# Patient Record
Sex: Female | Born: 1960 | Race: Black or African American | Hispanic: No | Marital: Single | Smoking: Never smoker
Health system: Southern US, Community
[De-identification: ages and names within clinical notes are randomized; demographics above are authoritative.]

## PROBLEM LIST (undated history)

## (undated) ENCOUNTER — Emergency Department (HOSPITAL_COMMUNITY): Admission: EM | Payer: Medicaid Other | Source: Home / Self Care

## (undated) DIAGNOSIS — C801 Malignant (primary) neoplasm, unspecified: Secondary | ICD-10-CM

## (undated) DIAGNOSIS — I10 Essential (primary) hypertension: Secondary | ICD-10-CM

## (undated) DIAGNOSIS — R112 Nausea with vomiting, unspecified: Secondary | ICD-10-CM

## (undated) DIAGNOSIS — Z9889 Other specified postprocedural states: Secondary | ICD-10-CM

## (undated) DIAGNOSIS — E669 Obesity, unspecified: Secondary | ICD-10-CM

## (undated) DIAGNOSIS — E079 Disorder of thyroid, unspecified: Secondary | ICD-10-CM

## (undated) DIAGNOSIS — R42 Dizziness and giddiness: Secondary | ICD-10-CM

## (undated) DIAGNOSIS — K219 Gastro-esophageal reflux disease without esophagitis: Secondary | ICD-10-CM

## (undated) HISTORY — DX: Essential (primary) hypertension: I10

## (undated) HISTORY — DX: Nausea with vomiting, unspecified: R11.2

## (undated) HISTORY — PX: ESOPHAGEAL DILATION: SHX303

## (undated) HISTORY — DX: Obesity, unspecified: E66.9

## (undated) HISTORY — DX: Other specified postprocedural states: Z98.890

## (undated) HISTORY — PX: ABDOMINAL HYSTERECTOMY: SHX81

---

## 1993-06-11 DIAGNOSIS — C801 Malignant (primary) neoplasm, unspecified: Secondary | ICD-10-CM

## 1993-06-11 HISTORY — DX: Malignant (primary) neoplasm, unspecified: C80.1

## 1993-06-11 HISTORY — PX: MASS EXCISION: SHX2000

## 1997-11-10 ENCOUNTER — Emergency Department (HOSPITAL_COMMUNITY): Admission: EM | Admit: 1997-11-10 | Discharge: 1997-11-10 | Payer: Self-pay | Admitting: Emergency Medicine

## 1998-03-31 ENCOUNTER — Emergency Department (HOSPITAL_COMMUNITY): Admission: EM | Admit: 1998-03-31 | Discharge: 1998-03-31 | Payer: Self-pay | Admitting: Emergency Medicine

## 1999-10-24 ENCOUNTER — Emergency Department (HOSPITAL_COMMUNITY): Admission: EM | Admit: 1999-10-24 | Discharge: 1999-10-24 | Payer: Self-pay | Admitting: Unknown Physician Specialty

## 1999-12-24 ENCOUNTER — Ambulatory Visit (HOSPITAL_COMMUNITY): Admission: EM | Admit: 1999-12-24 | Discharge: 1999-12-25 | Payer: Self-pay | Admitting: *Deleted

## 2000-04-11 ENCOUNTER — Encounter: Payer: Self-pay | Admitting: Emergency Medicine

## 2000-04-11 ENCOUNTER — Emergency Department (HOSPITAL_COMMUNITY): Admission: EM | Admit: 2000-04-11 | Discharge: 2000-04-11 | Payer: Self-pay

## 2000-04-23 ENCOUNTER — Emergency Department (HOSPITAL_COMMUNITY): Admission: EM | Admit: 2000-04-23 | Discharge: 2000-04-23 | Payer: Self-pay | Admitting: Emergency Medicine

## 2000-04-27 ENCOUNTER — Emergency Department (HOSPITAL_COMMUNITY): Admission: EM | Admit: 2000-04-27 | Discharge: 2000-04-27 | Payer: Self-pay | Admitting: Internal Medicine

## 2000-05-28 ENCOUNTER — Ambulatory Visit (HOSPITAL_COMMUNITY): Admission: EM | Admit: 2000-05-28 | Discharge: 2000-05-28 | Payer: Self-pay | Admitting: Emergency Medicine

## 2001-01-09 ENCOUNTER — Emergency Department (HOSPITAL_COMMUNITY): Admission: EM | Admit: 2001-01-09 | Discharge: 2001-01-09 | Payer: Self-pay | Admitting: Emergency Medicine

## 2001-05-29 ENCOUNTER — Emergency Department (HOSPITAL_COMMUNITY): Admission: EM | Admit: 2001-05-29 | Discharge: 2001-05-29 | Payer: Self-pay | Admitting: *Deleted

## 2001-09-02 ENCOUNTER — Emergency Department (HOSPITAL_COMMUNITY): Admission: EM | Admit: 2001-09-02 | Discharge: 2001-09-03 | Payer: Self-pay | Admitting: Emergency Medicine

## 2001-09-03 ENCOUNTER — Ambulatory Visit (HOSPITAL_COMMUNITY): Admission: RE | Admit: 2001-09-03 | Discharge: 2001-09-03 | Payer: Self-pay | Admitting: Emergency Medicine

## 2001-09-03 ENCOUNTER — Encounter: Payer: Self-pay | Admitting: Emergency Medicine

## 2001-11-22 ENCOUNTER — Emergency Department (HOSPITAL_COMMUNITY): Admission: EM | Admit: 2001-11-22 | Discharge: 2001-11-22 | Payer: Self-pay

## 2002-06-29 ENCOUNTER — Encounter: Admission: RE | Admit: 2002-06-29 | Discharge: 2002-08-13 | Payer: Self-pay | Admitting: Hematology & Oncology

## 2002-08-03 ENCOUNTER — Emergency Department (HOSPITAL_COMMUNITY): Admission: EM | Admit: 2002-08-03 | Discharge: 2002-08-03 | Payer: Self-pay | Admitting: Emergency Medicine

## 2002-08-04 ENCOUNTER — Encounter: Admission: RE | Admit: 2002-08-04 | Discharge: 2002-08-04 | Payer: Self-pay | Admitting: Gastroenterology

## 2002-08-04 ENCOUNTER — Encounter: Payer: Self-pay | Admitting: Gastroenterology

## 2002-08-06 ENCOUNTER — Ambulatory Visit (HOSPITAL_COMMUNITY): Admission: RE | Admit: 2002-08-06 | Discharge: 2002-08-06 | Payer: Self-pay | Admitting: Gastroenterology

## 2002-08-17 ENCOUNTER — Ambulatory Visit (HOSPITAL_COMMUNITY): Admission: RE | Admit: 2002-08-17 | Discharge: 2002-08-17 | Payer: Self-pay | Admitting: Gastroenterology

## 2002-09-08 ENCOUNTER — Ambulatory Visit (HOSPITAL_COMMUNITY): Admission: RE | Admit: 2002-09-08 | Discharge: 2002-09-08 | Payer: Self-pay | Admitting: Hematology & Oncology

## 2002-09-08 ENCOUNTER — Encounter: Payer: Self-pay | Admitting: Hematology & Oncology

## 2004-03-06 ENCOUNTER — Emergency Department (HOSPITAL_COMMUNITY): Admission: EM | Admit: 2004-03-06 | Discharge: 2004-03-06 | Payer: Self-pay | Admitting: Emergency Medicine

## 2004-06-01 ENCOUNTER — Ambulatory Visit: Payer: Self-pay | Admitting: Hematology & Oncology

## 2004-06-24 ENCOUNTER — Emergency Department (HOSPITAL_COMMUNITY): Admission: EM | Admit: 2004-06-24 | Discharge: 2004-06-24 | Payer: Self-pay | Admitting: Family Medicine

## 2004-08-02 ENCOUNTER — Ambulatory Visit: Payer: Self-pay | Admitting: Hematology & Oncology

## 2004-10-16 ENCOUNTER — Ambulatory Visit: Payer: Self-pay | Admitting: Hematology & Oncology

## 2004-10-23 ENCOUNTER — Inpatient Hospital Stay (HOSPITAL_COMMUNITY): Admission: AD | Admit: 2004-10-23 | Discharge: 2004-10-23 | Payer: Self-pay | Admitting: Family Medicine

## 2004-12-21 ENCOUNTER — Ambulatory Visit: Payer: Self-pay | Admitting: Cardiology

## 2004-12-26 ENCOUNTER — Ambulatory Visit: Payer: Self-pay | Admitting: Cardiology

## 2004-12-27 ENCOUNTER — Encounter: Admission: RE | Admit: 2004-12-27 | Discharge: 2004-12-27 | Payer: Self-pay | Admitting: Cardiology

## 2005-01-03 ENCOUNTER — Ambulatory Visit: Payer: Self-pay | Admitting: Cardiology

## 2005-01-03 ENCOUNTER — Inpatient Hospital Stay (HOSPITAL_BASED_OUTPATIENT_CLINIC_OR_DEPARTMENT_OTHER): Admission: RE | Admit: 2005-01-03 | Discharge: 2005-01-03 | Payer: Self-pay | Admitting: Internal Medicine

## 2005-01-09 ENCOUNTER — Ambulatory Visit: Payer: Self-pay | Admitting: Cardiology

## 2005-01-12 ENCOUNTER — Emergency Department (HOSPITAL_COMMUNITY): Admission: EM | Admit: 2005-01-12 | Discharge: 2005-01-12 | Payer: Self-pay | Admitting: Family Medicine

## 2005-01-17 ENCOUNTER — Ambulatory Visit: Payer: Self-pay | Admitting: Cardiology

## 2005-01-18 ENCOUNTER — Ambulatory Visit: Payer: Self-pay | Admitting: Hematology & Oncology

## 2005-02-19 ENCOUNTER — Emergency Department (HOSPITAL_COMMUNITY): Admission: EM | Admit: 2005-02-19 | Discharge: 2005-02-19 | Payer: Self-pay | Admitting: Family Medicine

## 2005-02-21 ENCOUNTER — Emergency Department (HOSPITAL_COMMUNITY): Admission: EM | Admit: 2005-02-21 | Discharge: 2005-02-21 | Payer: Self-pay | Admitting: Emergency Medicine

## 2005-03-12 ENCOUNTER — Ambulatory Visit: Payer: Self-pay | Admitting: Hematology & Oncology

## 2005-03-21 ENCOUNTER — Emergency Department (HOSPITAL_COMMUNITY): Admission: EM | Admit: 2005-03-21 | Discharge: 2005-03-21 | Payer: Self-pay | Admitting: Emergency Medicine

## 2005-03-23 ENCOUNTER — Ambulatory Visit (HOSPITAL_COMMUNITY): Admission: RE | Admit: 2005-03-23 | Discharge: 2005-03-23 | Payer: Self-pay | Admitting: Gastroenterology

## 2005-03-28 ENCOUNTER — Encounter: Admission: RE | Admit: 2005-03-28 | Discharge: 2005-03-28 | Payer: Self-pay | Admitting: Gastroenterology

## 2005-05-02 ENCOUNTER — Emergency Department (HOSPITAL_COMMUNITY): Admission: EM | Admit: 2005-05-02 | Discharge: 2005-05-02 | Payer: Self-pay | Admitting: Family Medicine

## 2005-05-23 ENCOUNTER — Emergency Department (HOSPITAL_COMMUNITY): Admission: EM | Admit: 2005-05-23 | Discharge: 2005-05-23 | Payer: Self-pay | Admitting: Emergency Medicine

## 2005-06-08 ENCOUNTER — Encounter: Admission: RE | Admit: 2005-06-08 | Discharge: 2005-06-08 | Payer: Self-pay | Admitting: Gastroenterology

## 2005-06-11 HISTORY — PX: MASTECTOMY: SHX3

## 2005-06-12 ENCOUNTER — Encounter: Admission: RE | Admit: 2005-06-12 | Discharge: 2005-06-12 | Payer: Self-pay | Admitting: *Deleted

## 2005-06-12 ENCOUNTER — Ambulatory Visit: Payer: Self-pay | Admitting: Hematology & Oncology

## 2005-07-19 ENCOUNTER — Ambulatory Visit (HOSPITAL_COMMUNITY): Admission: RE | Admit: 2005-07-19 | Discharge: 2005-07-19 | Payer: Self-pay | Admitting: Gastroenterology

## 2005-08-27 ENCOUNTER — Emergency Department (HOSPITAL_COMMUNITY): Admission: EM | Admit: 2005-08-27 | Discharge: 2005-08-27 | Payer: Self-pay | Admitting: Family Medicine

## 2005-11-01 ENCOUNTER — Emergency Department (HOSPITAL_COMMUNITY): Admission: EM | Admit: 2005-11-01 | Discharge: 2005-11-02 | Payer: Self-pay | Admitting: Emergency Medicine

## 2005-12-04 ENCOUNTER — Ambulatory Visit: Payer: Self-pay | Admitting: Hematology & Oncology

## 2005-12-10 LAB — CBC WITH DIFFERENTIAL/PLATELET
Basophils Absolute: 0 10*3/uL (ref 0.0–0.1)
Eosinophils Absolute: 0.2 10*3/uL (ref 0.0–0.5)
HGB: 13.3 g/dL (ref 11.6–15.9)
NEUT#: 3.7 10*3/uL (ref 1.5–6.5)
RDW: 13.9 % (ref 11.3–14.5)
WBC: 6 10*3/uL (ref 3.9–10.0)
lymph#: 1.8 10*3/uL (ref 0.9–3.3)

## 2005-12-20 ENCOUNTER — Encounter: Admission: RE | Admit: 2005-12-20 | Discharge: 2005-12-20 | Payer: Self-pay | Admitting: Hematology & Oncology

## 2006-01-28 ENCOUNTER — Emergency Department (HOSPITAL_COMMUNITY): Admission: EM | Admit: 2006-01-28 | Discharge: 2006-01-28 | Payer: Self-pay | Admitting: Family Medicine

## 2006-06-12 ENCOUNTER — Ambulatory Visit: Payer: Self-pay | Admitting: Hematology & Oncology

## 2006-06-17 LAB — COMPREHENSIVE METABOLIC PANEL
CO2: 21 mEq/L (ref 19–32)
Calcium: 9.3 mg/dL (ref 8.4–10.5)
Chloride: 108 mEq/L (ref 96–112)
Glucose, Bld: 83 mg/dL (ref 70–99)
Sodium: 140 mEq/L (ref 135–145)
Total Bilirubin: 0.3 mg/dL (ref 0.3–1.2)
Total Protein: 7.4 g/dL (ref 6.0–8.3)

## 2006-06-17 LAB — CBC WITH DIFFERENTIAL/PLATELET
Eosinophils Absolute: 0.1 10*3/uL (ref 0.0–0.5)
HCT: 38.7 % (ref 34.8–46.6)
LYMPH%: 28.7 % (ref 14.0–48.0)
MONO#: 0.3 10*3/uL (ref 0.1–0.9)
NEUT#: 3.9 10*3/uL (ref 1.5–6.5)
NEUT%: 63 % (ref 39.6–76.8)
Platelets: 294 10*3/uL (ref 145–400)
RBC: 4.67 10*6/uL (ref 3.70–5.32)
WBC: 6.1 10*3/uL (ref 3.9–10.0)
lymph#: 1.8 10*3/uL (ref 0.9–3.3)

## 2006-06-17 LAB — FERRITIN: Ferritin: 218 ng/mL (ref 10–291)

## 2006-12-10 ENCOUNTER — Ambulatory Visit: Payer: Self-pay | Admitting: Hematology & Oncology

## 2006-12-16 LAB — CBC WITH DIFFERENTIAL/PLATELET
BASO%: 0.6 % (ref 0.0–2.0)
EOS%: 3.8 % (ref 0.0–7.0)
HCT: 36.4 % (ref 34.8–46.6)
MCH: 28.2 pg (ref 26.0–34.0)
MCHC: 34.8 g/dL (ref 32.0–36.0)
MONO#: 0.3 10*3/uL (ref 0.1–0.9)
RBC: 4.49 10*6/uL (ref 3.70–5.32)
RDW: 13.4 % (ref 11.3–14.5)
WBC: 5.4 10*3/uL (ref 3.9–10.0)
lymph#: 1.8 10*3/uL (ref 0.9–3.3)

## 2006-12-16 LAB — COMPREHENSIVE METABOLIC PANEL
ALT: 14 U/L (ref 0–35)
AST: 18 U/L (ref 0–37)
CO2: 22 mEq/L (ref 19–32)
Calcium: 9 mg/dL (ref 8.4–10.5)
Chloride: 106 mEq/L (ref 96–112)
Potassium: 3.7 mEq/L (ref 3.5–5.3)
Sodium: 140 mEq/L (ref 135–145)
Total Protein: 7.1 g/dL (ref 6.0–8.3)

## 2006-12-16 LAB — FERRITIN: Ferritin: 223 ng/mL (ref 10–291)

## 2007-01-21 ENCOUNTER — Encounter: Admission: RE | Admit: 2007-01-21 | Discharge: 2007-01-21 | Payer: Self-pay | Admitting: *Deleted

## 2007-01-23 ENCOUNTER — Encounter: Admission: RE | Admit: 2007-01-23 | Discharge: 2007-01-23 | Payer: Self-pay | Admitting: *Deleted

## 2007-05-11 ENCOUNTER — Emergency Department (HOSPITAL_COMMUNITY): Admission: EM | Admit: 2007-05-11 | Discharge: 2007-05-11 | Payer: Self-pay | Admitting: Emergency Medicine

## 2008-02-26 ENCOUNTER — Encounter: Admission: RE | Admit: 2008-02-26 | Discharge: 2008-02-26 | Payer: Self-pay | Admitting: Family Medicine

## 2010-06-17 ENCOUNTER — Emergency Department (HOSPITAL_COMMUNITY)
Admission: EM | Admit: 2010-06-17 | Discharge: 2010-06-17 | Payer: Self-pay | Source: Home / Self Care | Admitting: Emergency Medicine

## 2010-07-03 ENCOUNTER — Encounter: Payer: Self-pay | Admitting: Family Medicine

## 2010-08-17 ENCOUNTER — Ambulatory Visit (HOSPITAL_COMMUNITY)
Admission: RE | Admit: 2010-08-17 | Discharge: 2010-08-17 | Disposition: A | Discharge status: Home / Self Care | Payer: Self-pay | Source: Ambulatory Visit | Attending: Family Medicine | Admitting: Family Medicine

## 2010-08-17 DIAGNOSIS — M79609 Pain in unspecified limb: Secondary | ICD-10-CM

## 2010-08-17 DIAGNOSIS — M7989 Other specified soft tissue disorders: Secondary | ICD-10-CM | POA: Insufficient documentation

## 2010-08-27 ENCOUNTER — Inpatient Hospital Stay (INDEPENDENT_AMBULATORY_CARE_PROVIDER_SITE_OTHER)
Admission: RE | Admit: 2010-08-27 | Discharge: 2010-08-27 | Disposition: A | Discharge status: Home / Self Care | Payer: Self-pay | Source: Ambulatory Visit | Attending: Family Medicine | Admitting: Family Medicine

## 2010-08-27 DIAGNOSIS — J019 Acute sinusitis, unspecified: Secondary | ICD-10-CM

## 2010-10-27 NOTE — Op Note (Signed)
   NAME:  [name], [name]                       ACCOUNT NO.:  [id number]   MEDICAL RECORD NO.:  [id number]                   PATIENT TYPE:  AMB   LOCATION:  ENDO                                 FACILITY:  [facility]   PHYSICIAN:  John C. Madilyn Fireman, M.D.                 DATE OF BIRTH:  [birth year]   DATE OF PROCEDURE:  08/06/2002  DATE OF DISCHARGE:                                 OPERATIVE REPORT   PROCEDURE:  Esophagogastroduodenoscopy with esophageal dilatation.   INDICATIONS FOR PROCEDURE:  A patient who has had a history of past  dysphagia status post empiric dilatation to 17 mm in the past in June 2003  whose had recurrent dysphagia. Barium swallow yesterday showed subtle  narrowing of the GE junction with a temporary hang up of 13 mm barium  tablet. The patient has had persistent sensation of solid food dysphagia for  the last week and desires repeat dilatation.   DESCRIPTION OF PROCEDURE:  The patient was placed in the left lateral  decubitus position then placed on the pulse monitor with continuous low flow  oxygen delivered by nasal cannula. She was sedated with 75 mcg IV fentanyl  and 10 mg IV Versed. The Olympus video endoscope was advanced under direct  vision into the oropharynx and esophagus. The esophagus was straight and of  normal caliber with the squamocolumnar line at 38 cm. Although there was  possibly some vague narrowing, I could not definitely discern any stricture  or ring. The scope passed fairly easily through the GE junction without  resistance. The stomach was entered and a small amount of liquid secretions  were suctioned from the fundus. Retroflexed view of the cardia was  unremarkable. The fundus, body, antrum and pylorus all appeared normal. The  duodenum was entered and both the bulb and second portion were well  inspected and appeared to be within normal limits. A Savary guidewire was  placed through the endoscope channel and the scope withdrawn. Savary  dilators of 16 and 17 mm were passed over the guidewire with mild resistance  and no blood seen on withdrawal. The  last dilator was removed together with  the wire and the patient returned to the recovery room in stable condition.  She tolerated the procedure well and there were no immediate complications.   IMPRESSION:  Possible subtle lower esophageal stricture dilated to 17 mm.   PLAN:  Advance diet and observe response to dilatation.                                                John C. Madilyn Fireman, M.D.    JCH/MEDQ  D:  08/06/2002  T:  08/06/2002  Job:  [id number]   cc:   Valeria Batman, M.D.

## 2010-10-27 NOTE — Op Note (Signed)
   NAME:  [name], [name]                       ACCOUNT NO.:  [id number]   MEDICAL RECORD NO.:  [id number]                   PATIENT TYPE:  AMB   LOCATION:  ENDO                                 FACILITY:  [facility]   PHYSICIAN:  John C. Madilyn Fireman, M.D.                 DATE OF BIRTH:  November 12, 1960   DATE OF PROCEDURE:  08/17/2002  DATE OF DISCHARGE:  08/17/2002                                 OPERATIVE REPORT   PROCEDURE PERFORMED:  Esophageal motility study.   ENDOSCOPIST:  Everardo All. Madilyn Fireman, M.D.   INDICATIONS FOR PROCEDURE:  Persistent dysphagia with no response to empiric  esophageal dilatation, no obvious stricture was seen but there was a mild  hang up of a 13 mm barium tablet at the gastroesophageal junction on prior  barium swallow.   RESULTS:  1. Upper esophageal sphincter:  Normal.  2. Lower esophageal sphincter:  Slightly high resting pressure at 57 mm with     normal relaxation at 76%.  3. Esophageal body:  Normal with 100% peristalsis.  Amplitude and duration     were normal.  Velocity slightly decreased on some swallows.   IMPRESSION:  Mild hypertensive lower esophageal sphincter with normal  relaxation, significance in regard to dysphagia unclear.   PLAN:  At this point will try a calcium channel blocker in the form of  Cardizem LA 180 mg a day and follow up in the office.                                               John C. Madilyn Fireman, M.D.    JCH/MEDQ  D:  08/20/2002  T:  08/21/2002  Job:  [id number]

## 2010-10-27 NOTE — Procedures (Signed)
[facility]  Patient:    [name], [name]                      MRN: [id number] Proc. Date: 05/28/00 Attending:  Verlin Grills, M.D. CC:         Almond Lint, M.D., [place], [place]   Procedure Report  PROCEDURE:  Esophagogastroduodenoscopy.  INDICATIONS:  [name] is a 50 year old female who presented to the [facility] Emergency Room with a meat bolus obstructing her esophagus.  Ms. Longie tells me she has undergone esophageal dilation at [facility] in the past.  INFORMED CONSENT:  I discussed with [name] the complications associated with esophagogastroduodenoscopy including intestinal bleeding and intestinal perforation.  [name] has signed the operative permit.  ENDOSCOPIST:  Verlin Grills, M.D.  PREMEDICATIONS:  Demerol 50 mg, Versed 10 mg.  ENDOSCOPE:  Olympus gastroscope.  DESCRIPTION OF PROCEDURE:  After obtaining informed consent, the patient was placed in the left lateral decubitus position.  I administered intravenous Demerol and intravenous Versed to achieve conscious sedation for the procedure.  The patients blood pressure, oxygen saturation, and cardiac rhythm were monitored throughout the procedure and documented in the medical record.  The Olympus gastroscope was passed through the posterior hypopharynx into the proximal esophagus without difficulty.  The hypopharynx, larynx and vocal cords appeared normal.  Esophagoscopy:  The proximal, mid and lower segments of the esophagus appeared normal.  Endoscopically, there was no food obstructing the esophagus.  There was no endoscopic evidence for the presence of Barretts esophagus, erosive esophagitis, esophageal mucosal scarring, or esophageal obstruction.  Gastroscopy:  Retroflex view of the gastric cardia and fundus was normal.  The gastric body was filled with liquid food matter.  The gastric antrum and pylorus appeared  normal.  Duodenoscopy:  The duodenal bulb and descending duodenum appeared normal.  ASSESSMENT:  Normal esophagogastroduodenoscopy.  No signs of esophageal obstruction.  No food obstructing the esophagus.  RECOMMENDATIONS:  I am referring [name] back to her primary care physician, who is Dr. Almond Lint in [place], [place], for repeat esophageal dilatation. DD:  05/28/00 TD:  05/30/00 Job: [id number]

## 2010-10-27 NOTE — Cardiovascular Report (Signed)
NAMERICCA, MELGAREJO NO.:  [id number]   MEDICAL RECORD NO.:  [id number]          PATIENT TYPE:  OIB   LOCATION:  6501                         FACILITY:  [facility]   PHYSICIAN:  Jonelle Sidle, M.D. LHCDATE OF BIRTH:  March 01, 1961   DATE OF PROCEDURE:  01/03/2005  DATE OF DISCHARGE:                              CARDIAC CATHETERIZATION   REQUESTING PHYSICIAN:  Jonelle Sidle, M.D.   INDICATIONS:  Ms. Bonneau is a 50 year old woman who recently referred for  an adenosine Cardiolite given recent complaints of palpitations.  This study  was interpreted by Dr. Myrtis Ser to show potential inferior wall ischemia with an  ejection fraction of 50%.  After reviewing the risks and benefits of  definitive diagnostic coronary angiography, the patient has agreed to  proceed for definitive evaluation of her coronary anatomy.   PROCEDURES PERFORMED:  1.  Left heart catheterization  2.  Selective coronary angiography.  3.  Left ventriculography.   ACCESS AND EQUIPMENT:  The area about the right femoral artery was  anesthetized with 1% lidocaine, and a  4-French sheath was placed in the  right femoral artery via modified Seldinger technique.  Standard preformed 4-  [place] JL-4 and JR-4 catheters were used for selective coronary angiography  and an angled pigtail catheter was used for left heart catheterization and  left ventriculography.  All exchanges were made over a wire.  The patient  tolerated procedure well without immediate complications.   HEMODYNAMICS:  Left ventricle 140/13 mmHg.  Aorta 141/84 mmHg.   ANGIOGRAPHIC FINDINGS:  1.  The left main coronary artery is medium in caliber and free of      significant flow-limiting coronary atherosclerosis.  2.  The left anterior descending is also medium in caliber and provides two      diagonal branches.  The vessel courses to the apex.  There are minor      luminal irregularities, but no flow-limiting coronary artery  disease is      noted.  3.  The circumflex coronary artery is medium in caliber.  There are two      diagonal obtuse marginal branches distally and a large trifurcating      proximal obtuse marginal branch.  No significant flow-limiting coronary      atherosclerosis is noted.  4.  The right coronary artery is relatively small in caliber and provides a      small posterior descending branch.  There are two proximal right      ventricular branches noted.  Minor luminal irregularities are seen      without any significant flow-limiting coronary atherosclerosis.  5.  Left ventriculography was performed in the RAO projection and reveals an      ejection fraction of approximately 65% without obvious wall motion      abnormality and no significant mitral regurgitation.   DIAGNOSES:  1.  No significant obstructive coronary disease noted in the major      epicardial vessels.  There are minor luminal irregularities as described      above.  2.  Left ventricular ejection fraction of approximately 65%  with a left      ventricular end-diastolic pressure of 13 mmHg and no significant mitral      regurgitation.   DISCUSSION:  I reviewed the results with the patient.  I suspect that the  Cardiolite was false positive and potentially affected by artifactual  changes.  At this point Ms. Fellner tells me that she is not having any  particular symptoms, and therefore would suggest a continued strategy of  basic risk factor modification. If she presents with any additional  complaints of palpitations, consideration could be given for an event  recorder.        SGM/MEDQ  D:  01/03/2005  T:  01/03/2005  Job:  [id number]   cc:   Donzetta Sprung  [address]  [place]  [place] [address]  Fax: [phone]

## 2010-10-27 NOTE — Op Note (Signed)
[name], [name]             ACCOUNT NO.:  [id number]   MEDICAL RECORD NO.:  [id number]          PATIENT TYPE:  AMB   LOCATION:  ENDO                         FACILITY:  [facility]   PHYSICIAN:  Petra Kuba, M.D.    DATE OF BIRTH:  [birth year]   DATE OF PROCEDURE:  03/23/2005  DATE OF DISCHARGE:                                 OPERATIVE REPORT   PROCEDURE:  Esophagogastroduodenoscopy.   INDICATIONS FOR PROCEDURE:  Odynophagia.  Consent was signed after risks,  benefits, methods, and options were thoroughly discussed by Dr. Madilyn Fireman  multiple times in the past.   MEDICATIONS USED:  Fentanyl 75 mcg, Versed 7.5 mg.   PROCEDURE:  The video endoscope was inserted by direct vision.  The  esophagus was normal.  The scope was advanced into the stomach, advanced  through a normal pylorus, into a normal duodenum bulb, and around the C-loop  to a normal second portion of the duodenum.  The scope was withdrawn back to  the bulb which, again, was normal.  The scope was withdrawn back to the  stomach, no evidence of gastritis, no other abnormalities were seen.  The  stomach was evaluated on straight and retroflex visualization with a good  look at the cardia, fundus, angularis, lesser and greater curve, on both  retroflex and straight visualization.  The air was suctioned, the scope was  slowly withdrawn.  Again, on very slow esophageal withdrawal, the esophagus  was completely normal.  A quick evaluation of the posterior pharynx was  normal, as well.  The scope was removed.  The patient tolerated the  procedure well.  There was no obvious immediate complications.   ENDOSCOPIC DIAGNOSIS:  1.  Minimal gastritis.  2.  Otherwise, normal EGD with a good look at the esophagus and a quick look      at the posterior pharynx.   PLAN:  Follow up with Dr. Madilyn Fireman p.r.n., call next week with the update, in  talking to  her, she does seem to be getting better and probably had a tiny  tear which has  irritated and is healing because she does say she is getting  better.  We will continue b.i.d. Nexium for now but when better, can go back  to once a day.           ______________________________  Petra Kuba, M.D.     MEM/MEDQ  D:  03/23/2005  T:  03/23/2005  Job:  [id number]   cc:   Donzetta Sprung  Fax: [phone]   John C. Madilyn Fireman, M.D.  Fax: [phone]

## 2010-10-27 NOTE — Procedures (Signed)
[facility] [place]  Patient:    TOVAH, SLAVICK Visit Number: [id number] MRN: [id number]          Service Type: EMS Location: [facility] Attending Physician:  Armanda Heritage Dictated by:   Everardo All Madilyn Fireman, M.D. Proc. Date: 11/22/01 Admit Date:  11/22/2001 Discharge Date: 11/22/2001                             Procedure Report  PROCEDURE PERFORMED:  Esophagogastroduodenoscopy with esophageal dilatation.  ENDOSCOPIST:  Everardo All. Madilyn Fireman, M.D.  INDICATIONS FOR PROCEDURE:  Sensation of foreign body impaction of the esophagus after eating a steak biscuit.  DESCRIPTION OF PROCEDURE:  The patient was placed in the left lateral decubitus position and placed on the pulse monitor with continuous low flow oxygen delivered by nasal cannula.  She was sedated with 100 mg of IV Demerol and 7.5 mg Versed.  The Olympus video endoscope was advanced under direct vision into the oropharynx and esophagus.  The esophagus was straight and of normal caliber.  The squamocolumnar line at 36 cm above a 2 cm sliding hiatal hernia.  I could not detect any stricture, ring or any foreign body in the esophagus and no visible esophagitis.  The stomach was entered and a small amount of liquid secretions were suctioned from the fundus. Retroflex view of the cardia was unremarkable.  The fundus, body, antrum and pylorus all appeared normal.  The duodenum was entered and both the bulb and second portion were well inspected and appeared to be within normal limits.  The Savary guide wire was placed through the endoscope channel.  Endoscope withdrawn.  A single 17 mm Savary dilator was passed over the guide wire with minimal resistance, no blood seen on withdrawal.  The guide wire was removed together with the wire and the patient returned to the recovery room in stable condition.  The patient tolerated the procedure well.  IMPRESSION: 1. No evidence of foreign body.  Suspect food bolus passed  shortly prior to    the procedure. 2. No obvious stricture, but cannot rule out a subtle ring.  Status post    empiric dilatation to 17 mm.  PLAN:  Advance diet to observe response to dilatation.  Continue Prilosec and follow up p.r.n. for any further dysphagia. Dictated by:   Everardo All Madilyn Fireman, M.D. Attending Physician:  Armanda Heritage DD:  11/22/01 TD:  11/24/01 Job: [id number]

## 2010-10-27 NOTE — Procedures (Signed)
[facility]  Patient:    MAKIA, BOSSI                    MRN: [id number] Proc. Date: 12/24/99 Adm. Date:  [id number] Disc. Date: [id number] Attending:  Ephriam Knuckles H                           Procedure Report  PROCEDURE:  Esophagogastroduodenoscopy.  INDICATIONS FOR PROCEDURE:  This pleasant 50 year old black female presented to the emergency room with esophageal food impaction at this time.  The patient is a resident of [place], [place] who was in [place] at this time.  She was eating a steak when she felt it got stuck in the esophageal region.  She had difficulty with swallowing and appeared to be associated high in the neck region.  She states she was able to take liquids down without emesing back up but she was afraid of attempting solid products.  The patient has a history of esophageal problems.  The patient has been diagnosed as having a hernia and has undergone dilatation approximately one year ago.  She denies any history of any peptic ulcer disease or any gallbladder or liver disease as noted.  She denies any hematemesis or melena or hematochezia.  She came into the emergency room where she was evaluated by ADP and I was contacted for a possible foreign body removal as noted.  OBJECTIVE FINDINGS:  She is a pleasant female who appears to be in no acute distress.  HEENT:  Anicteric.  NECK:  Supple.  LUNGS:  Clear.  HEART:  Regular rate and rhythm without heaves, murmurs or gallops.  ABDOMEN:  Soft, no tenderness, no hepatosplenomegaly.  EXTREMITIES:  Within normal limits.  PLAN:  Will proceed with endoscopic examination.  INFORMED CONSENT:  The patient was advised of procedure, indications and risks involved.  The patient has agreed to have the procedure performed at this time.  Consent form was obtained.  PREOPERATIVE PREPARATION:  Patient was brought to the endoscopy unit and underwent IV sedating mediation used.   Monitor was placed on the patient to monitor the patients vital signs and oxygen saturation.  Nasal oxygen at 2 L/minute was used.  After adequate sedation was performed, the procedure was begun.  DESCRIPTION OF PROCEDURE:  The instrument was advanced with the patient lying in the left lateral position via direct technique without difficulty.  The oropharyeal, epiglottis, vocal cords and piriform sinuses appeared to be grossly within normal limits.  The esophagus appeared to show some evidencee of debris present, but no evidenc of any food impaction that was noted.  There appeared to be no  acute inflammatory changes or ulcerated changes appreciated.  I did not appreciate a hiatal hernia at this time.  As the instrument was advanced further to the distal portion of the esophagus, the luminal region appeared to be closed at one point, but no evidence of any obstructive process that was present.  The instrument was able to advance through this region without difficulty and into the gastric area.  There were no varices or hiatal hernia appreciated.  The gastric area showed evidence of debris present in the gastric body that was noted. There was no evidence of acute inflammatory changes or ulcerated changes that were noticed.  The mucosal pattern could not be well visualized due to increased debris, but no gross abnormalities were noted.  The antrum appeared to be within limits  at this time.  The pylorus is normal and advancing into the pyloric canal, the duodenal bulb and second portion appeared to be within normal limits.  The instrument was retracted back for a retroflexed view of the cardia.  It did not show evidence of a hiatal hernia appreciated at this time.  There was however, noted, some focal inflammation noted around the cardia region that was present.  Photographs were taken of this region.  The instrument was retracted back with the Z-line appearing to be approximately 35 cm  distal esophagus as noted.  The instrument was retracted back into the esophagus, but there was no evidence of any postendoscopic trauma to the mucosa.  The instrument was subsequently removed per oram without difficulty and the patient tolerated the procedure well.  TREATMENT: 1. I would recommend PPI at this time. 2. If she has not taken her medications today, would give IV Pepcid while she    is resting and she subsequently can be discharged postprocedure. 3. Would recommend she follow up with a gastroenterologist in [place] and to    check to see if she had esophageal motility studies to evaluate the    function of the esophagus to see if there is any poor peristaltic activity    noted.  Depending upon these results would determine the course of therapy. D:  12/24/99 TD:  12/24/99 Job: [id number]

## 2010-11-06 ENCOUNTER — Emergency Department (HOSPITAL_COMMUNITY): Payer: No Typology Code available for payment source

## 2010-11-06 ENCOUNTER — Emergency Department (HOSPITAL_COMMUNITY)
Admission: EM | Admit: 2010-11-06 | Discharge: 2010-11-06 | Disposition: A | Discharge status: Home / Self Care | Payer: No Typology Code available for payment source | Attending: Emergency Medicine | Admitting: Emergency Medicine

## 2010-11-06 DIAGNOSIS — T1490XA Injury, unspecified, initial encounter: Secondary | ICD-10-CM | POA: Insufficient documentation

## 2010-11-06 DIAGNOSIS — Y92410 Unspecified street and highway as the place of occurrence of the external cause: Secondary | ICD-10-CM | POA: Insufficient documentation

## 2010-11-06 DIAGNOSIS — S40029A Contusion of unspecified upper arm, initial encounter: Secondary | ICD-10-CM | POA: Insufficient documentation

## 2010-11-06 DIAGNOSIS — M79609 Pain in unspecified limb: Secondary | ICD-10-CM | POA: Insufficient documentation

## 2011-05-17 ENCOUNTER — Other Ambulatory Visit: Payer: Self-pay | Admitting: Family Medicine

## 2011-05-17 DIAGNOSIS — Z9012 Acquired absence of left breast and nipple: Secondary | ICD-10-CM

## 2011-05-17 DIAGNOSIS — Z1231 Encounter for screening mammogram for malignant neoplasm of breast: Secondary | ICD-10-CM

## 2011-05-29 ENCOUNTER — Emergency Department (INDEPENDENT_AMBULATORY_CARE_PROVIDER_SITE_OTHER)
Admission: EM | Admit: 2011-05-29 | Discharge: 2011-05-29 | Disposition: A | Discharge status: Home / Self Care | Payer: Medicaid Other | Source: Home / Self Care | Attending: Family Medicine | Admitting: Family Medicine

## 2011-05-29 ENCOUNTER — Encounter: Payer: Self-pay | Admitting: *Deleted

## 2011-05-29 DIAGNOSIS — M26629 Arthralgia of temporomandibular joint, unspecified side: Secondary | ICD-10-CM

## 2011-05-29 DIAGNOSIS — H6590 Unspecified nonsuppurative otitis media, unspecified ear: Secondary | ICD-10-CM

## 2011-05-29 HISTORY — DX: Disorder of thyroid, unspecified: E07.9

## 2011-05-29 MED ORDER — IBUPROFEN 600 MG PO TABS: 600.0000 mg | ORAL_TABLET | Freq: Three times a day (TID) | ORAL | Status: AC | PRN

## 2011-05-29 MED ORDER — CYCLOBENZAPRINE HCL 10 MG PO TABS: 10.0000 mg | ORAL_TABLET | Freq: Two times a day (BID) | ORAL | Status: AC | PRN

## 2011-05-29 MED ORDER — FEXOFENADINE-PSEUDOEPHED ER 60-120 MG PO TB12: 1.0000 | ORAL_TABLET | Freq: Two times a day (BID) | ORAL | Status: AC

## 2011-05-29 NOTE — ED Notes (Signed)
Pt  Has  Symptoms    Of  r   Ear   Pain  Sensation of fullness  To  The  r  Ear      Symptoms  Began today  She  Reports       She  Had  Flu  Last  Week  That that  Is  Better

## 2011-05-31 NOTE — ED Provider Notes (Signed)
History     CSN: [id number] Arrival date & time: 05/29/2011  9:20 PM   First MD Initiated Contact with Patient 05/29/11 2015      Chief Complaint  Patient presents with  . Otalgia    (Consider location/radiation/quality/duration/timing/severity/associated sxs/prior treatment) HPI Comments: 50 y/o female with h/o hypothyroidism here c/o right ear discomfort described as "fullness sensation". No ear discharge. States she had flu like symptoms 1 week ago that are now resolved. Denies fever, cough difficulty breathing or chest pain. No sinus pressure or rhinorrhea. No h/o HTN. Denies headache,chest pain or leg swelling.    Past Medical History  Diagnosis Date  . Thyroid disease     Past Surgical History  Procedure Date  . Abdominal hysterectomy   . Mastectomy     Family History  Problem Relation Age of Onset  . Asthma Mother   . Diabetes Father     History  Substance Use Topics  . Smoking status: Not on file  . Smokeless tobacco: Not on file  . Alcohol Use:     OB History    Grav Para Term Preterm Abortions TAB SAB Ect Mult Living                  Review of Systems  Constitutional: Negative.   HENT: Positive for ear pain. Negative for congestion, sore throat, rhinorrhea, trouble swallowing, voice change, sinus pressure and tinnitus.   Respiratory: Negative for cough, choking, shortness of breath and wheezing.   Cardiovascular: Negative for chest pain, palpitations and leg swelling.  Skin: Negative for rash.  Neurological: Negative for dizziness and headaches.    Allergies  Aspirin  Home Medications   Current Outpatient Rx  Name Route Sig Dispense Refill  . LEVOTHYROXINE SODIUM 137 MCG PO TABS Oral Take 137 mcg by mouth daily.      . CYCLOBENZAPRINE HCL 10 MG PO TABS Oral Take 1 tablet (10 mg total) by mouth 2 (two) times daily as needed for muscle spasms. 20 tablet 0  . FEXOFENADINE-PSEUDOEPHED ER 60-120 MG PO TB12 Oral Take 1 tablet by mouth every 12  (twelve) hours. 30 tablet 0  . IBUPROFEN 600 MG PO TABS Oral Take 1 tablet (600 mg total) by mouth every 8 (eight) hours as needed for pain. 21 tablet 0    BP 161/93  Pulse 93  Temp(Src) 98.3 F (36.8 C) (Oral)  Resp 20  SpO2 96%  Physical Exam  Nursing note and vitals reviewed. Constitutional: She is oriented to person, place, and time. She appears well-developed and well-nourished. No distress.  HENT:  Head: Normocephalic and atraumatic.  Right Ear: External ear normal.  Left Ear: External ear normal.  Nose: Nose normal.  Mouth/Throat: Oropharynx is clear and moist. No oropharyngeal exudate.       Impress clear fluid behind both TM otherwise with normal light reflex and no swelling or redness.  Right ear canal normal. No vesicles erythema or swelling. There is tenderness to palpation of the right TMJ worse with range of motion. No obvious swelling. No tenderness over right mastoid process. 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  Eyes: Conjunctivae are normal. Pupils are equal, round, and reactive to light.  Neck: Neck supple.  Cardiovascular: Normal rate, regular rhythm and normal heart sounds.   Pulmonary/Chest: Effort normal and breath sounds normal. No respiratory distress. She has no wheezes. She has no rales. She exhibits no tenderness.  Musculoskeletal:       Tender right  TMJ as above.  Lymphadenopathy:    She has no cervical adenopathy.  Neurological: She is alert and oriented to person, place, and time.    ED Course  Procedures (including critical care time)  Labs Reviewed - No data to display No results found.   1. Otitis media, serous   2. TMJ arthralgia       MDM  No signs of infection. Treated symptomatically.        Sharin Grave, MD 05/31/11 [phone]

## 2011-06-21 ENCOUNTER — Ambulatory Visit
Admission: RE | Admit: 2011-06-21 | Discharge: 2011-06-21 | Disposition: A | Discharge status: Home / Self Care | Payer: No Typology Code available for payment source | Source: Ambulatory Visit | Attending: Family Medicine | Admitting: Family Medicine

## 2011-06-21 DIAGNOSIS — Z9012 Acquired absence of left breast and nipple: Secondary | ICD-10-CM

## 2011-06-21 DIAGNOSIS — Z1231 Encounter for screening mammogram for malignant neoplasm of breast: Secondary | ICD-10-CM

## 2011-07-16 ENCOUNTER — Ambulatory Visit: Payer: Medicaid Other | Attending: Family Medicine | Admitting: Physical Therapy

## 2011-07-16 DIAGNOSIS — I890 Lymphedema, not elsewhere classified: Secondary | ICD-10-CM | POA: Insufficient documentation

## 2011-07-16 DIAGNOSIS — IMO0001 Reserved for inherently not codable concepts without codable children: Secondary | ICD-10-CM | POA: Insufficient documentation

## 2011-08-13 ENCOUNTER — Encounter: Payer: No Typology Code available for payment source | Admitting: Physical Therapy

## 2011-08-17 ENCOUNTER — Encounter: Payer: No Typology Code available for payment source | Admitting: Physical Therapy

## 2011-08-20 ENCOUNTER — Encounter: Payer: No Typology Code available for payment source | Admitting: Physical Therapy

## 2011-08-22 ENCOUNTER — Encounter: Payer: No Typology Code available for payment source | Admitting: Physical Therapy

## 2011-08-24 ENCOUNTER — Encounter: Payer: No Typology Code available for payment source | Admitting: Physical Therapy

## 2011-08-27 ENCOUNTER — Encounter: Payer: No Typology Code available for payment source | Admitting: Physical Therapy

## 2011-08-29 ENCOUNTER — Encounter: Payer: No Typology Code available for payment source | Admitting: Physical Therapy

## 2011-08-31 ENCOUNTER — Encounter: Payer: No Typology Code available for payment source | Admitting: Physical Therapy

## 2011-09-03 ENCOUNTER — Encounter: Payer: No Typology Code available for payment source | Admitting: Physical Therapy

## 2011-09-05 ENCOUNTER — Encounter: Payer: No Typology Code available for payment source | Admitting: Physical Therapy

## 2012-12-06 ENCOUNTER — Encounter (HOSPITAL_COMMUNITY): Payer: Self-pay | Admitting: *Deleted

## 2012-12-06 ENCOUNTER — Emergency Department (HOSPITAL_COMMUNITY)
Admission: EM | Admit: 2012-12-06 | Discharge: 2012-12-06 | Disposition: A | Discharge status: Home / Self Care | Payer: Self-pay | Attending: Emergency Medicine | Admitting: Emergency Medicine

## 2012-12-06 ENCOUNTER — Emergency Department (HOSPITAL_COMMUNITY): Payer: Self-pay

## 2012-12-06 DIAGNOSIS — J329 Chronic sinusitis, unspecified: Secondary | ICD-10-CM | POA: Insufficient documentation

## 2012-12-06 DIAGNOSIS — R42 Dizziness and giddiness: Secondary | ICD-10-CM | POA: Insufficient documentation

## 2012-12-06 DIAGNOSIS — IMO0002 Reserved for concepts with insufficient information to code with codable children: Secondary | ICD-10-CM | POA: Insufficient documentation

## 2012-12-06 DIAGNOSIS — E079 Disorder of thyroid, unspecified: Secondary | ICD-10-CM | POA: Insufficient documentation

## 2012-12-06 DIAGNOSIS — J3489 Other specified disorders of nose and nasal sinuses: Secondary | ICD-10-CM | POA: Insufficient documentation

## 2012-12-06 DIAGNOSIS — H9209 Otalgia, unspecified ear: Secondary | ICD-10-CM | POA: Insufficient documentation

## 2012-12-06 DIAGNOSIS — Z79899 Other long term (current) drug therapy: Secondary | ICD-10-CM | POA: Insufficient documentation

## 2012-12-06 DIAGNOSIS — R112 Nausea with vomiting, unspecified: Secondary | ICD-10-CM | POA: Insufficient documentation

## 2012-12-06 DIAGNOSIS — R51 Headache: Secondary | ICD-10-CM | POA: Insufficient documentation

## 2012-12-06 MED ORDER — FLUTICASONE PROPIONATE 50 MCG/ACT NA SUSP: 2.0000 | Freq: Every day | NASAL | Status: DC

## 2012-12-06 MED ORDER — ACETAMINOPHEN 325 MG PO TABS
650.0000 mg | ORAL_TABLET | Freq: Once | ORAL | Status: AC
  Administered 2012-12-06: 650 mg via ORAL
  Filled 2012-12-06: qty 2

## 2012-12-06 MED ORDER — FEXOFENADINE HCL 60 MG PO TABS: 60.0000 mg | ORAL_TABLET | Freq: Two times a day (BID) | ORAL | Status: DC

## 2012-12-06 MED ORDER — DEXAMETHASONE SODIUM PHOSPHATE 10 MG/ML IJ SOLN
10.0000 mg | Freq: Once | INTRAMUSCULAR | Status: AC
  Administered 2012-12-06: 10 mg via INTRAMUSCULAR
  Filled 2012-12-06: qty 1

## 2012-12-06 NOTE — ED Notes (Signed)
Pt states pain in head is mostly around her eyes and nose area.

## 2012-12-06 NOTE — ED Notes (Addendum)
Pt c/o headache, eye pain, nausea and ear pain that started this morning. Pt states she has not been around anyone who has been sick. Pt rates headache 5/10. Pt states she did not take any medications for pain prior to arrival.

## 2012-12-06 NOTE — ED Notes (Signed)
Pt states Eye pain, ear pain, HA, nose pain, N/V/D, starting today at 5:00 pm

## 2012-12-06 NOTE — ED Provider Notes (Signed)
History    CSN: [id number] Arrival date & time 12/06/12  1906  First MD Initiated Contact with Patient 12/06/12 2042     Chief Complaint  Patient presents with  . Nausea   (Consider location/radiation/quality/duration/timing/severity/associated sxs/prior Treatment) HPI Comments: Patient presents with headache. She states she woke up this morning with a bifrontal-type headache. She states it's been intermittent throughout the day. She describes as a pressure feeling in her frontal scalp area. She also associates at times radiates to her face and around her nose. She also feels like her ears are clogged and sore. She denies any pain in her eyes or vision changes. She states the pain is above her eyes in the bifrontal area but she denies any actual pain around orbits were to her eyes. She felt a little lightheaded she stands up. She states the headache gets worse if she bends over to pick something up. She's had a history of similar headache many years ago in the past which was related to a sinus infection. She denies any known sinus congestion or drainage. She denies any cough or chest congestion. She denies he fevers or chills. She denies any neck pain or stiffness. She's had 2 episodes of vomiting associated with the headache.  Past Medical History  Diagnosis Date  . Thyroid disease    Past Surgical History  Procedure Laterality Date  . Abdominal hysterectomy    . Mastectomy     Family History  Problem Relation Age of Onset  . Asthma Mother   . Diabetes Father    History  Substance Use Topics  . Smoking status: Not on file  . Smokeless tobacco: Not on file  . Alcohol Use:    OB History   Grav Para Term Preterm Abortions TAB SAB Ect Mult Living                 Review of Systems  Constitutional: Negative for fever, chills, diaphoresis and fatigue.  HENT: Positive for ear pain and sinus pressure. Negative for congestion, sore throat, facial swelling, rhinorrhea, sneezing,  mouth sores, dental problem and voice change.   Eyes: Negative.   Respiratory: Negative for cough, chest tightness and shortness of breath.   Cardiovascular: Negative for chest pain and leg swelling.  Gastrointestinal: Positive for nausea and vomiting. Negative for abdominal pain, diarrhea and blood in stool.  Genitourinary: Negative for frequency, hematuria, flank pain and difficulty urinating.  Musculoskeletal: Negative for back pain and arthralgias.  Skin: Negative for rash.  Neurological: Positive for light-headedness and headaches. Negative for dizziness, speech difficulty, weakness and numbness.    Allergies  Aspirin  Home Medications   Current Outpatient Rx  Name  Route  Sig  Dispense  Refill  . levothyroxine (SYNTHROID, LEVOTHROID) 137 MCG tablet   Oral   Take 137 mcg by mouth daily.           Marland Kitchen lisinopril-hydrochlorothiazide (PRINZIDE,ZESTORETIC) 10-12.5 MG per tablet   Oral   Take 1 tablet by mouth daily.         . fexofenadine (ALLEGRA) 60 MG tablet   Oral   Take 1 tablet (60 mg total) by mouth 2 (two) times daily.   30 tablet   0   . fluticasone (FLONASE) 50 MCG/ACT nasal spray   Nasal   Place 2 sprays into the nose daily.   16 g   0    BP 118/69  Pulse 84  Temp(Src) 98.1 F (36.7 C) (Oral)  Resp 18  SpO2 94% Physical Exam  Constitutional: She is oriented to person, place, and time. She appears well-developed and well-nourished.  HENT:  Head: Normocephalic and atraumatic.  Right Ear: External ear normal.  Left Ear: External ear normal.  Mouth/Throat: Oropharynx is clear and moist.  There is some cobblestoning to the posterior pharynx. Patient is tenderness to percussion of the maxillary and frontal sinus areas. She has no pain on palpation over the eyes.  Eyes: Conjunctivae and EOM are normal. Pupils are equal, round, and reactive to light.  Neck: Normal range of motion. Neck supple.  No meningeal signs  Cardiovascular: Normal rate, regular  rhythm and normal heart sounds.   Pulmonary/Chest: Effort normal and breath sounds normal. No respiratory distress. She has no wheezes. She has no rales. She exhibits no tenderness.  Abdominal: Soft. Bowel sounds are normal. There is no tenderness. There is no rebound and no guarding.  Musculoskeletal: Normal range of motion. She exhibits no edema.  Lymphadenopathy:    She has no cervical adenopathy.  Neurological: She is alert and oriented to person, place, and time. She has normal strength. No cranial nerve deficit or sensory deficit. GCS eye subscore is 4. GCS verbal subscore is 5. GCS motor subscore is 6.  Finger to nose intact  Skin: Skin is warm and dry. No rash noted.  Psychiatric: She has a normal mood and affect.    ED Course  Procedures (including critical care time)  Ct Head Wo Contrast  12/06/2012   *RADIOLOGY REPORT*  Clinical Data: Headache and vomiting.  Ear pain.  CT HEAD WITHOUT CONTRAST  Technique:  Contiguous axial images were obtained from the base of the skull through the vertex without contrast.  Comparison: None.  Findings: Bone windows demonstrate clear paranasal sinuses and mastoid air cells.  Soft tissue windows demonstrate no  mass lesion, hemorrhage, hydrocephalus, acute infarct, intra-axial, or extra-axial fluid collection.  IMPRESSION: Normal head CT.   Original Report Authenticated By: Jeronimo Greaves, M.D.     1. Sinusitis     MDM  Patient with complaints of headache. She was given a dose of Tylenol here and said her headache was much better. I did do a head CT given her past remote history of cancer but there is no evidence of mass. She has no symptoms suggestive of subarachnoid hemorrhage or meningitis. Her symptoms sound suggestive for sinusitis but given it's only been gone on for one day I do not feel that antibiotics are indicated. She was given a dose of Decadron as well as Allegra and Flonase to use. She is advised to followup with her primary care  physician if her headaches are not improving with this or return here if she has any worsening headache or other worsening symptoms.  Rolan Bucco, MD 12/06/12 [phone]

## 2013-02-12 ENCOUNTER — Other Ambulatory Visit: Payer: Self-pay

## 2013-02-12 DIAGNOSIS — Z1231 Encounter for screening mammogram for malignant neoplasm of breast: Secondary | ICD-10-CM

## 2013-03-05 ENCOUNTER — Ambulatory Visit
Admission: RE | Admit: 2013-03-05 | Discharge: 2013-03-05 | Disposition: A | Discharge status: Home / Self Care | Payer: Medicaid Other | Source: Ambulatory Visit

## 2013-03-05 DIAGNOSIS — Z1231 Encounter for screening mammogram for malignant neoplasm of breast: Secondary | ICD-10-CM

## 2013-05-08 ENCOUNTER — Emergency Department (HOSPITAL_COMMUNITY)
Admission: EM | Admit: 2013-05-08 | Discharge: 2013-05-08 | Disposition: A | Discharge status: Home / Self Care | Payer: Self-pay | Attending: Emergency Medicine | Admitting: Emergency Medicine

## 2013-05-08 ENCOUNTER — Encounter (HOSPITAL_COMMUNITY): Payer: Self-pay | Admitting: Emergency Medicine

## 2013-05-08 DIAGNOSIS — Z853 Personal history of malignant neoplasm of breast: Secondary | ICD-10-CM | POA: Insufficient documentation

## 2013-05-08 DIAGNOSIS — Z79899 Other long term (current) drug therapy: Secondary | ICD-10-CM | POA: Insufficient documentation

## 2013-05-08 DIAGNOSIS — E079 Disorder of thyroid, unspecified: Secondary | ICD-10-CM | POA: Insufficient documentation

## 2013-05-08 DIAGNOSIS — T18128A Food in esophagus causing other injury, initial encounter: Secondary | ICD-10-CM

## 2013-05-08 DIAGNOSIS — T18108A Unspecified foreign body in esophagus causing other injury, initial encounter: Secondary | ICD-10-CM | POA: Insufficient documentation

## 2013-05-08 DIAGNOSIS — Y929 Unspecified place or not applicable: Secondary | ICD-10-CM | POA: Insufficient documentation

## 2013-05-08 DIAGNOSIS — Y9389 Activity, other specified: Secondary | ICD-10-CM | POA: Insufficient documentation

## 2013-05-08 DIAGNOSIS — IMO0002 Reserved for concepts with insufficient information to code with codable children: Secondary | ICD-10-CM | POA: Insufficient documentation

## 2013-05-08 HISTORY — DX: Malignant (primary) neoplasm, unspecified: C80.1

## 2013-05-08 MED ORDER — GLUCAGON HCL (RDNA) 1 MG IJ SOLR
1.0000 mg | Freq: Once | INTRAMUSCULAR | Status: AC
  Administered 2013-05-08: 1 mg via INTRAVENOUS
  Filled 2013-05-08: qty 1

## 2013-05-08 NOTE — ED Notes (Signed)
Pt reports she ate a piece of cake that had pecans in it, and it feels like she had a pecan stuck in her throat. Denies sob. Speaking in full sentences, raspy voice. Nad, skin warm and dry, resp e/u.

## 2013-05-08 NOTE — ED Provider Notes (Signed)
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 She states that she 80 P. 60 today with a continent and felt that she had a pea stuck. She has had difficulty swallowing her saliva but this has improved. She was unable to swallow water at home. No difficulty breathing. Patient has a raspy voice but states is his baseline.  ROS: See HPI Constitutional: no fever  Eyes: no drainage  ENT: no runny nose   Cardiovascular:  no chest pain  Resp: no SOB  GI: no vomiting GU: no dysuria Integumentary: no rash  Allergy: no hives  Musculoskeletal: no leg swelling  Neurological: no slurred speech ROS otherwise negative  PAST MEDICAL HISTORY/PAST SURGICAL HISTORY:  Past Medical History  Diagnosis Date  . Thyroid disease   . Cancer     breast cancer    MEDICATIONS:  Prior to Admission medications   Medication Sig Start Date End Date Taking? Authorizing Provider  Fexofenadine HCl (ALLEGRA PO) Take 1 tablet by mouth daily as needed (for allergies).   Yes Historical Provider, MD  levothyroxine (SYNTHROID, LEVOTHROID) 137 MCG tablet Take 137 mcg by mouth daily.     Yes Historical Provider, MD  lisinopril-hydrochlorothiazide (PRINZIDE,ZESTORETIC) 10-12.5 MG per tablet Take 1 tablet by mouth daily.   Yes Historical Provider, MD    ALLERGIES:  Allergies  Allergen Reactions  . Aspirin Swelling    SOCIAL HISTORY:  History  Substance Use Topics  . Smoking status: Never Smoker   . Smokeless tobacco: Not on file  . Alcohol Use: No    FAMILY HISTORY: Family History  Problem Relation Age of Onset  . Asthma Mother   . Diabetes Father     EXAM: BP 135/84  Temp(Src) 98.8 F (37.1 C) (Oral)   Resp 16  Ht 5\' 4"  (1.626 m)  Wt 209 lb (94.802 kg)  BMI 35.86 kg/m2  SpO2 100% CONSTITUTIONAL: Alert and oriented and responds appropriately to questions. Well-appearing; well-nourished HEAD: Normocephalic EYES: Conjunctivae clear, PERRL ENT: normal nose; no rhinorrhea; moist mucous membranes; pharynx without lesions noted; no tonsillar hypertrophy or exudate; raspy voice which patient reports is baseline NECK: Supple, no meningismus, no LAD  CARD: RRR; S1 and S2 appreciated; no murmurs, no clicks, no rubs, no gallops RESP: Normal chest excursion without splinting or tachypnea; breath sounds clear and equal bilaterally; no wheezes, no rhonchi, no rales,  ABD/GI: Normal bowel sounds; non-distended; soft, non-tender, no rebound, no guarding BACK:  The back appears normal and is non-tender to palpation, there is no CVA tenderness EXT: Normal ROM in all joints; non-tender to palpation; no edema; normal capillary refill; no cyanosis    SKIN: Normal color for age and race; warm NEURO: Moves all extremities equally PSYCH: The patient's mood and manner are appropriate. Grooming and personal hygiene are appropriate.  MEDICAL DECISION MAKING: Patient here with food bolus. Will give glucagon and reassess. No airway involvement. She has a prior history of esophageal stricture requiring endoscopy and removal of a prior food bolus many years ago.  ED PROGRESS: Patient reports some improvement of her symptoms after glucagon. Will by mouth challenge.   Patient has been able to tolerate liquids and reports feeling much better. We'll discharge home.  She reports she is followed with Dr. Jenne Pane with gastroenterology. Given return precautions. Patient verbalized understanding and is comfortable with plan.  Layla Maw Jayanna Kroeger, DO 05/08/13 1322

## 2013-07-08 ENCOUNTER — Telehealth: Payer: Self-pay | Admitting: *Deleted

## 2013-07-08 NOTE — Telephone Encounter (Signed)
Pt called to ask to speak with Dr. Marin Olp.  States she was treated by him for her breast cancer many  Years ago and she has swelling in the arm she had the masectomy in.  States she has no insurance and just lost her job.  Dr. Marin Olp states he will call her.

## 2013-07-13 ENCOUNTER — Other Ambulatory Visit: Payer: Self-pay | Admitting: Hematology & Oncology

## 2013-07-13 DIAGNOSIS — C50911 Malignant neoplasm of unspecified site of right female breast: Secondary | ICD-10-CM

## 2013-07-14 ENCOUNTER — Ambulatory Visit (HOSPITAL_BASED_OUTPATIENT_CLINIC_OR_DEPARTMENT_OTHER): Payer: Medicaid Other | Admitting: Hematology & Oncology

## 2013-07-14 ENCOUNTER — Other Ambulatory Visit (HOSPITAL_BASED_OUTPATIENT_CLINIC_OR_DEPARTMENT_OTHER): Payer: Medicaid Other | Admitting: Lab

## 2013-07-14 ENCOUNTER — Encounter: Payer: Self-pay | Admitting: Hematology & Oncology

## 2013-07-14 VITALS — BP 113/71 | HR 90 | Temp 98.6°F | Resp 14 | Ht 64.0 in | Wt 207.0 lb

## 2013-07-14 DIAGNOSIS — Z8501 Personal history of malignant neoplasm of esophagus: Secondary | ICD-10-CM

## 2013-07-14 DIAGNOSIS — C50912 Malignant neoplasm of unspecified site of left female breast: Secondary | ICD-10-CM

## 2013-07-14 DIAGNOSIS — R490 Dysphonia: Secondary | ICD-10-CM

## 2013-07-14 DIAGNOSIS — C50911 Malignant neoplasm of unspecified site of right female breast: Secondary | ICD-10-CM

## 2013-07-14 DIAGNOSIS — I890 Lymphedema, not elsewhere classified: Secondary | ICD-10-CM

## 2013-07-14 DIAGNOSIS — C50919 Malignant neoplasm of unspecified site of unspecified female breast: Secondary | ICD-10-CM

## 2013-07-14 DIAGNOSIS — Z9010 Acquired absence of unspecified breast and nipple: Secondary | ICD-10-CM

## 2013-07-14 LAB — CBC WITH DIFFERENTIAL (CANCER CENTER ONLY)
BASO#: 0 10*3/uL (ref 0.0–0.2)
BASO%: 0.1 % (ref 0.0–2.0)
EOS%: 1.9 % (ref 0.0–7.0)
Eosinophils Absolute: 0.1 10*3/uL (ref 0.0–0.5)
HEMATOCRIT: 35.9 % (ref 34.8–46.6)
HEMOGLOBIN: 11.8 g/dL (ref 11.6–15.9)
LYMPH#: 2.2 10*3/uL (ref 0.9–3.3)
LYMPH%: 29.2 % (ref 14.0–48.0)
MCH: 27.7 pg (ref 26.0–34.0)
MCHC: 32.9 g/dL (ref 32.0–36.0)
MCV: 84 fL (ref 81–101)
MONO#: 0.6 10*3/uL (ref 0.1–0.9)
MONO%: 7.9 % (ref 0.0–13.0)
NEUT%: 60.9 % (ref 39.6–80.0)
NEUTROS ABS: 4.6 10*3/uL (ref 1.5–6.5)
Platelets: 267 10*3/uL (ref 145–400)
RBC: 4.26 10*6/uL (ref 3.70–5.32)
RDW: 13.4 % (ref 11.1–15.7)
WBC: 7.5 10*3/uL (ref 3.9–10.0)

## 2013-07-14 LAB — CMP (CANCER CENTER ONLY)
ALBUMIN: 3.8 g/dL (ref 3.3–5.5)
ALK PHOS: 72 U/L (ref 26–84)
ALT(SGPT): 12 U/L (ref 10–47)
AST: 18 U/L (ref 11–38)
BUN, Bld: 11 mg/dL (ref 7–22)
CALCIUM: 9.1 mg/dL (ref 8.0–10.3)
CHLORIDE: 103 meq/L (ref 98–108)
CO2: 30 mEq/L (ref 18–33)
Creat: 0.6 mg/dl (ref 0.6–1.2)
Glucose, Bld: 86 mg/dL (ref 73–118)
POTASSIUM: 4.2 meq/L (ref 3.3–4.7)
SODIUM: 142 meq/L (ref 128–145)
TOTAL PROTEIN: 8.6 g/dL — AB (ref 6.4–8.1)
Total Bilirubin: 0.5 mg/dl (ref 0.20–1.60)

## 2013-07-15 LAB — CANCER ANTIGEN 27.29: CA 27.29: 15 U/mL (ref 0–39)

## 2013-08-07 ENCOUNTER — Telehealth: Payer: Self-pay | Admitting: Hematology & Oncology

## 2013-08-07 NOTE — Telephone Encounter (Signed)
Pt called said we were suppose to call for follow up appointment from her 2-3 appointment. Pt is aware to call me wens on my direct number if she hasn't heard from me. MD and RN aware

## 2013-08-12 ENCOUNTER — Telehealth: Payer: Self-pay | Admitting: Hematology & Oncology

## 2013-08-12 NOTE — Telephone Encounter (Signed)
Pt called no one has still called her for follow-up. Pt left message on RN line

## 2013-08-17 ENCOUNTER — Telehealth: Payer: Self-pay | Admitting: Hematology & Oncology

## 2013-08-17 NOTE — Telephone Encounter (Signed)
Pt called again, no one has called her back. Per RN they will call her today. Pt also wants synthroid prescription, RN aware

## 2013-08-17 NOTE — Telephone Encounter (Signed)
Pt aware of 3-20 MRI and 3-24 MD appointments

## 2013-08-25 ENCOUNTER — Other Ambulatory Visit: Payer: Self-pay | Admitting: *Deleted

## 2013-08-25 MED ORDER — LEVOTHYROXINE SODIUM 150 MCG PO TABS: 150.0000 ug | ORAL_TABLET | Freq: Every day | ORAL | Status: DC

## 2013-08-28 ENCOUNTER — Other Ambulatory Visit: Payer: Self-pay | Admitting: Hematology & Oncology

## 2013-08-28 ENCOUNTER — Ambulatory Visit (HOSPITAL_COMMUNITY)
Admission: RE | Admit: 2013-08-28 | Discharge: 2013-08-28 | Disposition: A | Discharge status: Home / Self Care | Payer: Self-pay | Source: Ambulatory Visit | Attending: Hematology & Oncology | Admitting: Hematology & Oncology

## 2013-08-28 DIAGNOSIS — E079 Disorder of thyroid, unspecified: Secondary | ICD-10-CM | POA: Insufficient documentation

## 2013-08-28 DIAGNOSIS — M7989 Other specified soft tissue disorders: Secondary | ICD-10-CM | POA: Insufficient documentation

## 2013-08-28 DIAGNOSIS — Z9010 Acquired absence of unspecified breast and nipple: Secondary | ICD-10-CM | POA: Insufficient documentation

## 2013-08-28 DIAGNOSIS — M79609 Pain in unspecified limb: Secondary | ICD-10-CM | POA: Insufficient documentation

## 2013-08-28 DIAGNOSIS — C50912 Malignant neoplasm of unspecified site of left female breast: Secondary | ICD-10-CM

## 2013-08-28 DIAGNOSIS — C50919 Malignant neoplasm of unspecified site of unspecified female breast: Secondary | ICD-10-CM | POA: Insufficient documentation

## 2013-08-28 MED ORDER — GADOBENATE DIMEGLUMINE 529 MG/ML IV SOLN
20.0000 mL | Freq: Once | INTRAVENOUS | Status: AC | PRN
  Administered 2013-08-28: 20 mL via INTRAVENOUS

## 2013-08-29 ENCOUNTER — Emergency Department (HOSPITAL_COMMUNITY): Payer: Self-pay

## 2013-08-29 ENCOUNTER — Encounter (HOSPITAL_COMMUNITY): Payer: Self-pay | Admitting: Emergency Medicine

## 2013-08-29 ENCOUNTER — Emergency Department (HOSPITAL_COMMUNITY)
Admission: EM | Admit: 2013-08-29 | Discharge: 2013-08-29 | Disposition: A | Discharge status: Home / Self Care | Payer: Self-pay | Attending: Emergency Medicine | Admitting: Emergency Medicine

## 2013-08-29 DIAGNOSIS — Z87828 Personal history of other (healed) physical injury and trauma: Secondary | ICD-10-CM | POA: Insufficient documentation

## 2013-08-29 DIAGNOSIS — Z79899 Other long term (current) drug therapy: Secondary | ICD-10-CM | POA: Insufficient documentation

## 2013-08-29 DIAGNOSIS — R0989 Other specified symptoms and signs involving the circulatory and respiratory systems: Secondary | ICD-10-CM

## 2013-08-29 DIAGNOSIS — R6889 Other general symptoms and signs: Secondary | ICD-10-CM | POA: Insufficient documentation

## 2013-08-29 DIAGNOSIS — Z8719 Personal history of other diseases of the digestive system: Secondary | ICD-10-CM | POA: Insufficient documentation

## 2013-08-29 DIAGNOSIS — Z853 Personal history of malignant neoplasm of breast: Secondary | ICD-10-CM | POA: Insufficient documentation

## 2013-08-29 DIAGNOSIS — E039 Hypothyroidism, unspecified: Secondary | ICD-10-CM | POA: Insufficient documentation

## 2013-08-29 LAB — BASIC METABOLIC PANEL
BUN: 12 mg/dL (ref 6–23)
CO2: 25 mEq/L (ref 19–32)
Calcium: 9.5 mg/dL (ref 8.4–10.5)
Chloride: 102 mEq/L (ref 96–112)
Creatinine, Ser: 0.82 mg/dL (ref 0.50–1.10)
GFR, EST NON AFRICAN AMERICAN: 81 mL/min — AB (ref >90)
Glucose, Bld: 94 mg/dL (ref 70–99)
POTASSIUM: 4 meq/L (ref 3.7–5.3)
SODIUM: 141 meq/L (ref 137–147)

## 2013-08-29 LAB — CBC WITH DIFFERENTIAL/PLATELET
BASOS ABS: 0 10*3/uL (ref 0.0–0.1)
BASOS PCT: 0 % (ref 0–1)
EOS ABS: 0.2 10*3/uL (ref 0.0–0.7)
Eosinophils Relative: 3 % (ref 0–5)
HCT: 37.8 % (ref 36.0–46.0)
Hemoglobin: 12.8 g/dL (ref 12.0–15.0)
Lymphocytes Relative: 36 % (ref 12–46)
Lymphs Abs: 2.3 10*3/uL (ref 0.7–4.0)
MCH: 27.9 pg (ref 26.0–34.0)
MCHC: 33.9 g/dL (ref 30.0–36.0)
MCV: 82.5 fL (ref 78.0–100.0)
Monocytes Absolute: 0.3 10*3/uL (ref 0.1–1.0)
Monocytes Relative: 4 % (ref 3–12)
NEUTROS PCT: 56 % (ref 43–77)
Neutro Abs: 3.6 10*3/uL (ref 1.7–7.7)
PLATELETS: 238 10*3/uL (ref 150–400)
RBC: 4.58 MIL/uL (ref 3.87–5.11)
RDW: 13.9 % (ref 11.5–15.5)
WBC: 6.3 10*3/uL (ref 4.0–10.5)

## 2013-08-29 MED ORDER — GLUCAGON HCL (RDNA) 1 MG IJ SOLR: 1.0000 mg | Freq: Once | INTRAMUSCULAR | Status: DC

## 2013-08-29 MED ORDER — IOHEXOL 300 MG/ML  SOLN
75.0000 mL | Freq: Once | INTRAMUSCULAR | Status: AC | PRN
  Administered 2013-08-29: 75 mL via INTRAVENOUS

## 2013-08-29 NOTE — ED Notes (Signed)
Pt returned from xray

## 2013-08-29 NOTE — Discharge Instructions (Signed)
Swallowed Foreign Body, Adult You have swallowed an object (foreign body). Once the foreign body has passed through the food tube (esophagus), which leads from the mouth to the stomach, it will usually continue through the body without problems. This is because the point where the esophagus enters into the stomach is the narrowest place through which the foreign body must pass. Sometimes the foreign body gets stuck. The most common type of foreign body obstruction in adults is food impaction. Many times, bones from fish or meat products may become lodged in the esophagus or injure the throat on the way down. When there is an object that obstructs the esophagus, the most obvious symptoms are pain and the inability to swallow normally. In some cases, foreign bodies that can be life threatening are swallowed. Examples of these are certain medications and illicit drugs. Often in these instances, patients are afraid of telling what they swallowed. However, it is extremely important to tell the emergency caregiver what was swallowed because life-saving treatment may be needed.  X-ray exams may be taken to find the location of the foreign body. However, some objects do not show up well or may be too small to be seen on an X-ray image. If the foreign body is too large or too sharp, it may be too dangerous to allow it to pass on its own. You may need to see a caregiver who specializes in the digestive system (gastroenterologist). In a few cases, a specialist may need to remove the object using a method called "endoscopy". This involves passing a thin, soft, flexible tube into the food pipe to locate and remove the object. Follow up with your primary doctor or the referral you were given by the emergency caregiver. HOME CARE INSTRUCTIONS   If your caregiver says it is safe for you to eat, then only have liquids and soft foods until your symptoms improve.  Once you are eating normally:  Cut food into small  pieces.  Remove small bones from food.  Remove large seeds and pits from fruit.  Chew your food well.  Do not talk, laugh, or engage in physical activity while eating or swallowing. SEEK MEDICAL CARE IF:  You develop worsening shortness of breath, uncontrollable coughing, chest pains or high fever, greater than 102 F (38.9 C).  You are unable to eat or drink or you feel that food is getting stuck in your throat.  You have choking symptoms or cannot stop drooling.  You develop abdominal pain, vomiting (especially of blood), or rectal bleeding. MAKE SURE YOU:   Understand these instructions.  Will watch your condition.  Will get help right away if you are not doing well or get worse. Document Released: 11/15/2009 Document Revised: 08/20/2011 Document Reviewed: 11/15/2009 [facility] Patient Information 2014 [place].    Follow- up with your Oncology doctor regarding your MRI results, you may need an ultrasound and biopsy Return if symptoms worsen

## 2013-08-29 NOTE — ED Notes (Signed)
Pt tolerating oral fluids 

## 2013-08-29 NOTE — ED Provider Notes (Signed)
CSN: [id number]     Arrival date & time 08/29/13  1151 History   First MD Initiated Contact with Patient 08/29/13 1223     Chief Complaint  Patient presents with  . Foreign Body     (Consider location/radiation/quality/duration/timing/severity/associated sxs/prior Treatment) Patient is a 53 y.o. female presenting with foreign body. The history is provided by the patient. No language interpreter was used.  Foreign Body Location:  Swallowed Suspected object:  Nut Pain severity:  No pain Timing:  Constant Progression:  Unchanged Ineffective treatments:  Eating Associated symptoms: no abdominal pain, no cough, no nausea and no vomiting    52y.o AA F with hx of breast cancer, and hypothyroidism presents to ER with suspected cashew stuck in esophagus.  Pt describes eating cashews last night and had a feeling that one got stuck in throat.  She ate a sandwich afterwards to try and clear it, but the feeling persisted.  She has not had anything to eat today.  Denies SOB, cough, changes to voice.  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  She denies N/V.  She has hx of having food stuck in esophagus twice in last year and states a Doctor told her that she has a narrow spot in esophagus.  On exam, pt is in no pain or distress.  Past Medical History  Diagnosis Date  . Thyroid disease   . Cancer     breast cancer   Past Surgical History  Procedure Laterality Date  . Abdominal hysterectomy    . Mastectomy     Family History  Problem Relation Age of Onset  . Asthma Mother   . Diabetes Father    History  Substance Use Topics  . Smoking status: Never Smoker   . Smokeless tobacco: Never Used     Comment: never used product  . Alcohol Use: No   OB History   Grav Para Term Preterm Abortions TAB SAB Ect Mult Living                 Review of Systems  Constitutional: Negative for fever, chills and  diaphoresis.  Respiratory: Negative for cough, chest tightness, shortness of breath and wheezing.   Gastrointestinal: Negative for nausea, vomiting, abdominal pain and diarrhea.      Allergies  Aspirin  Home Medications   Current Outpatient Rx  Name  Route  Sig  Dispense  Refill  . Fexofenadine HCl (ALLEGRA PO)   Oral   Take 1 tablet by mouth as needed (for allergies).          Marland Kitchen levothyroxine (SYNTHROID) 150 MCG tablet   Oral   Take 1 tablet (150 mcg total) by mouth daily before breakfast.   30 tablet   3   . lisinopril-hydrochlorothiazide (PRINZIDE,ZESTORETIC) 10-12.5 MG per tablet   Oral   Take 1 tablet by mouth daily.          BP 151/73  Pulse 70  Temp(Src) 98.4 F (36.9 C) (Oral)  Resp 16  SpO2 100% Physical Exam  Constitutional: She appears well-developed and well-nourished. No distress.  HENT:  Head: Normocephalic.  Eyes: Conjunctivae are normal. Pupils are equal, round, and reactive to light.  Neck: Normal range of motion. No tracheal deviation present.  Cardiovascular: Normal rate, regular rhythm and normal heart sounds.   Pulmonary/Chest: Effort normal and breath sounds normal. No respiratory distress. She  has no wheezes.  Abdominal: Soft. Bowel sounds are normal. There is no tenderness.    ED Course  Procedures (including critical care time) Labs Review Labs Reviewed - No data to display Imaging Review Mr Chest W Wo Contrast  08/28/2013   CLINICAL DATA:  Breast cancer with history of left mastectomy. Left arm pain and swelling. Evaluate for possible chest wall recurrence or brachial plexus involvement.  EXAM: MRI CHEST WITHOUT AND WITH CONTRAST  TECHNIQUE: Multiplanar multisequence MR imaging of the chest was performed both before and after administration of intravenous contrast.  CONTRAST:  23mL MULTIHANCE GADOBENATE DIMEGLUMINE 529 MG/ML IV SOLN  COMPARISON:  None.  FINDINGS: There are surgical changes from a left mastectomy. No MR findings for  chest wall tumor. The vector Alice muscle appears normal. No axillary lymphadenopathy or mass. The break heel plexus structures appear normal. The neurovascular bundle is surrounded by fat. There are scattered lymph nodes noted in the left supraclavicular region along the course of the brachial plexus. The largest node measures 12.5 x 5.5 mm. There is a large complex left paratracheal mass measuring 3.7 x 3.2 cm. This is most likely a thyroid lesion. Areas of increased T1 signal intensity suggest hemorrhage. Recommend ultrasound correlation and consideration for biopsy.  There is a 12.5 x 9.5 mm right paratracheal node. No other mediastinal lymph nodes are identified.  The chest wall musculature is normal. No obvious lung lesions. The bony structures are unremarkable. No obvious bone lesions.  IMPRESSION: 3.7 x 3.2 cm complex left thyroid mass. Recommend ultrasound correlation and consideration for biopsy.  Small scattered lower neck nodes, left supraclavicular nodes and left subpectoral nodes. No mass in the brachial plexus region. PET-CT may be helpful for further evaluation.   Electronically Signed   By: Kalman Jewels M.D.   On: 08/28/2013 09:45     EKG Interpretation None      MDM   Final diagnoses:  Foreign body sensation in throat   History of difficulty swallowing and choking episode with a cashew. History of esophageal stricture. Pt continues to feel foreign body sensation in her throat. Negative CT or neck. No difficulty breathing, edema, wheezing or difficulty swallowing. Fluid challenge given in ER and tolerated well. Also, pt had history of eating a sandwich without any problems after episode with the nut. Return precautions given. Follow-up with PCP.      Elisha Headland, NP 09/03/13 2210

## 2013-08-29 NOTE — ED Notes (Signed)
Pt presents to department for evaluation of foreign body to throat. States she thinks she has peanut lodged in throat. Respirations unlabored. States issues with narrow esophagus. Denies pain. No signs of distress noted at the time.

## 2013-08-29 NOTE — ED Notes (Signed)
Pt states shes had surgery on her vocal cords due to a mass which makes her esophagus narrow. States she was eating cashews yesterday and feels like something got stuck in her throat. Pt is breathing fine, lung sounds clear, sats 100% RA. No stridor noted.

## 2013-09-01 ENCOUNTER — Encounter: Payer: Self-pay | Admitting: Hematology & Oncology

## 2013-09-01 ENCOUNTER — Ambulatory Visit (HOSPITAL_BASED_OUTPATIENT_CLINIC_OR_DEPARTMENT_OTHER): Payer: Medicaid Other | Admitting: Hematology & Oncology

## 2013-09-01 VITALS — BP 132/70 | HR 83 | Temp 98.2°F | Resp 14 | Ht 64.0 in | Wt 207.0 lb

## 2013-09-01 DIAGNOSIS — Z853 Personal history of malignant neoplasm of breast: Secondary | ICD-10-CM

## 2013-09-01 DIAGNOSIS — I890 Lymphedema, not elsewhere classified: Secondary | ICD-10-CM

## 2013-09-01 DIAGNOSIS — E079 Disorder of thyroid, unspecified: Secondary | ICD-10-CM

## 2013-09-01 NOTE — Progress Notes (Signed)
Hematology and Oncology Follow Up Visit  [name] [id number] 03/14/1961 53 y.o. 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  Cooley is in for a long awaited followup. We last saw her back in 2008. At that point in time, she been free of her breast cancer recurrence by 10 years.  She wanted to come back to see [place] at she's having some issues. She has some hoarseness. There is more swelling in her left arm. She had a mastectomy with lymph node dissection on 52 years ago. She's having more pain in the left arm. It is hard for her  to really do much as because of some swelling and pain. I think we will have to get an MRI of the brachial plexus to make sure there is no local recurrence in the axilla.  There is no chest pain. There is no cough. She's had no bleeding. There's been no change in bowel or bladder habits. Her last mammogram was in September of 2014 and looked good with no problems in the right breast.  Medications: Current outpatient prescriptions:lisinopril-hydrochlorothiazide (PRINZIDE,ZESTORETIC) 10-12.5 MG per tablet, Take 1 tablet by mouth daily., Disp: , Rfl: ;  fexofenadine (ALLEGRA) 180 MG tablet, Take 180 mg by mouth daily as needed for allergies or rhinitis., Disp: , Rfl: ;  levothyroxine (SYNTHROID) 150 MCG tablet, Take 1 tablet (150 mcg total) by mouth daily before breakfast., Disp: 30 tablet, Rfl: 3  Allergies:  Allergies  Allergen Reactions  . Aspirin Swelling    Past Medical History, Surgical history, Social history, and Family History were reviewed and updated.  Review of Systems: As above   Physical Exam:  height is 5\' 4"  (1.626 m) and weight is 207 lb (93.895 kg). Her oral temperature is 98.6 F (37 C). Her blood pressure is 113/71 and her pulse is 90. Her respiration is 14.   Obese African [place] female. Her head neck exam  shows some slight fullness in the left lower neck. There may be a thyroid nodule. There is no adenopathy in the neck. There is some slight fullness in the left supraclavicular fossa. Lungs are clear. Cardiac exam regular rate and rhythm with no murmurs rubs or bruits. Breast exam shows right breast no masses edema or erythema. There is no right axillary adenopathy. The left chest wall shows a well healed mastectomy. There are some radiation changes. There is no actual left axillary adenopathy. Abdomen is soft. Is good bowel sounds. There is no fluid wave. There is no palpable liver or spleen tip. Back shows no tenderness over the spine ribs or hips. Extremities showed 2+ lymphedema in the left arm. She has some decreased range of motion of the left arm and shoulder. Right arm and legs are unremarkable. Skin exam no rashes. Neurological exam no focal neurological deficits.  Lab Results  Component Value Date   WBC 6.3 08/29/2013   HGB 12.8 08/29/2013   HCT 37.8 08/29/2013   MCV 82.5 08/29/2013   PLT 238 08/29/2013     Chemistry      Component Value Date/Time   NA 141 08/29/2013 1321   NA 142 07/14/2013 1025   K 4.0 08/29/2013 1321   K 4.2 07/14/2013 1025   CL 102 08/29/2013 1321   CL 103 07/14/2013 1025   CO2 25 08/29/2013 1321   CO2 30 07/14/2013 1025   BUN  12 08/29/2013 1321   BUN 11 07/14/2013 1025   CREATININE 0.82 08/29/2013 1321   CREATININE 0.6 07/14/2013 1025      Component Value Date/Time   CALCIUM 9.5 08/29/2013 1321   CALCIUM 9.1 07/14/2013 1025   ALKPHOS 72 07/14/2013 1025   ALKPHOS 62 12/16/2006 0922   AST 18 07/14/2013 1025   AST 18 12/16/2006 0922   ALT 12 07/14/2013 1025   ALT 14 12/16/2006 0922   BILITOT 0.50 07/14/2013 1025   BILITOT 0.4 12/16/2006 6283         Impression and Plan: [name] is 53 year old African American female. She has a remote history of a locally recurrent breast cancer. This was a mucinous subtype. She underwent resection and radiation. She was on tamoxifen.  Again I am  not sure which going on with the neck. The worse this could be coming from the thyroid. We'll have to do in a CT or MRI.  We will evaluate the lymphedema also. We'll see if there is any issues in the left axilla.  It was good to see her again. Again have not seen her for 6 years.  I spent a good hour with her today.  We will get her back in about a month or so.   Volanda Napoleon, MD 3/24/201512:59 PM

## 2013-09-01 NOTE — Progress Notes (Signed)
Hematology and Oncology Follow Up Visit  [name] [id number] 1961/02/20 53 y.o. 09/01/2013   Principle Diagnosis:   History of locally recurrent mucinous adenocarcinoma of the left breast-16 years disease free  Current Therapy:    Observation     Interim History:  Ms.  Cooley is back for followup. We last saw her about a month or so ago. We did do an MRI. She is having some hoarseness. She does have a left thyroid lobe mass. It measures about 3 x 4 x 3 cm. We will need to get an or sound and biopsy of this.  CT scan did not show any suspicious lymphadenopathy. His MRI of the chest did not show any pulmonary nodules. There is no suspicious lymphadenopathy in the chest. Brachial plexus of the left arm was a normal. She has lymphedema of the left arm. This does cause her problems. It swells up when she tries to use it for any amount of time. She is on some diuretic which helps on occasion.  She has occasional pain down the right leg. This has been going on for a couple months. We will need to get an MRI of the lumbar spine. Mucinous breast cancers can 10 to metastasized late.  She's had no fever. She's had no bleeding.  Medications: Current outpatient prescriptions:fexofenadine (ALLEGRA) 180 MG tablet, Take 180 mg by mouth daily as needed for allergies or rhinitis., Disp: , Rfl: ;  levothyroxine (SYNTHROID) 150 MCG tablet, Take 1 tablet (150 mcg total) by mouth daily before breakfast., Disp: 30 tablet, Rfl: 3;  lisinopril-hydrochlorothiazide (PRINZIDE,ZESTORETIC) 10-12.5 MG per tablet, Take 1 tablet by mouth daily., Disp: , Rfl:   Allergies:  Allergies  Allergen Reactions  . Aspirin Swelling    Past Medical History, Surgical history, Social history, and Family History were reviewed and updated.  Review of Systems: As above  Physical Exam:  height is 5\' 4"  (1.626 m) and weight is 207 lb (93.895 kg). Her oral temperature is 98.2 F (36.8 C). Her blood pressure is 132/70 and  her pulse is 83. Her respiration is 14.   Somewhat obese Afro-American female. She does have some fullness in the left lower neck. Slight tenderness is noted. There is some slight fullness in the left supraclavicular region. No oral lesions are noted. Ocular exam is unremarkable. Lungs are clear. Cardiac exam regular in rhythm. Abdomen is soft. She has good bowel sounds. There is no fluid wave. There is no palpable liver or spleen tip. Back exam shows some tenderness in the lumbosacral spine. She has some slight spasms in this region. Extremities shows 2+ lymphedema of the left arm. She has decreased range of motion of the left arm and shoulder. There is no erythema. She has slightly decreased strength of the left arm. Right arm or legs are unremarkable. Neurological exam is nonfocal. Lab Results  Component Value Date   WBC 6.3 08/29/2013   HGB 12.8 08/29/2013   HCT 37.8 08/29/2013   MCV 82.5 08/29/2013   PLT 238 08/29/2013     Chemistry      Component Value Date/Time   NA 141 08/29/2013 1321   NA 142 07/14/2013 1025   K 4.0 08/29/2013 1321   K 4.2 07/14/2013 1025   CL 102 08/29/2013 1321   CL 103 07/14/2013 1025   CO2 25 08/29/2013 1321   CO2 30 07/14/2013 1025   BUN 12 08/29/2013 1321   BUN 11 07/14/2013 1025   CREATININE 0.82 08/29/2013 1321  CREATININE 0.6 07/14/2013 1025      Component Value Date/Time   CALCIUM 9.5 08/29/2013 1321   CALCIUM 9.1 07/14/2013 1025   ALKPHOS 72 07/14/2013 1025   ALKPHOS 62 12/16/2006 0922   AST 18 07/14/2013 1025   AST 18 12/16/2006 0922   ALT 12 07/14/2013 1025   ALT 14 12/16/2006 0922   BILITOT 0.50 07/14/2013 1025   BILITOT 0.4 12/16/2006 0539         Impression and Plan: [name] is a 53 year old African American female. She is a remote history of locally recurrent mucinous adenocarcinoma of the breast. She underwent radiation for this. She was on tamoxifen. The recurrent was 16 years ago.  I think we may have a new problem with this thyroid mass. We will have to get  this evaluated and biopsied. It certainly would not be totally shocking if this were found to be recurrent mucinous adenocarcinoma. If so, then she will clearly need thyroidectomy and possible radiation with aromatase inhibitor therapy.  We will get the scans set up. I will plan to see her back in another 4-6 weeks.   I spent a good 45 minutes with her today. I went over her scans that she had previously. I went over lab work. I explained why we had to get the thyroid biopsied and why we had to look at the lower back. She understands this and is very thankful for our approach to taking care of her.   Volanda Napoleon, MD 3/24/201512:51 PM

## 2013-09-02 ENCOUNTER — Telehealth: Payer: Self-pay | Admitting: Hematology & Oncology

## 2013-09-02 NOTE — Telephone Encounter (Signed)
Pt aware of 3-31 [place], MRI and 5-20 MD appointments

## 2013-09-03 ENCOUNTER — Other Ambulatory Visit: Payer: Self-pay | Admitting: *Deleted

## 2013-09-03 MED ORDER — AZITHROMYCIN 250 MG PO TABS: ORAL_TABLET | ORAL | Status: DC

## 2013-09-08 ENCOUNTER — Other Ambulatory Visit: Payer: Self-pay | Admitting: Hematology & Oncology

## 2013-09-08 ENCOUNTER — Ambulatory Visit (HOSPITAL_COMMUNITY)
Admission: RE | Admit: 2013-09-08 | Discharge: 2013-09-08 | Disposition: A | Discharge status: Home / Self Care | Payer: Self-pay | Source: Ambulatory Visit | Attending: Hematology & Oncology | Admitting: Hematology & Oncology

## 2013-09-08 DIAGNOSIS — M5137 Other intervertebral disc degeneration, lumbosacral region: Secondary | ICD-10-CM | POA: Insufficient documentation

## 2013-09-08 DIAGNOSIS — E079 Disorder of thyroid, unspecified: Secondary | ICD-10-CM

## 2013-09-08 DIAGNOSIS — Z853 Personal history of malignant neoplasm of breast: Secondary | ICD-10-CM | POA: Insufficient documentation

## 2013-09-08 DIAGNOSIS — M79609 Pain in unspecified limb: Secondary | ICD-10-CM | POA: Insufficient documentation

## 2013-09-08 DIAGNOSIS — M51379 Other intervertebral disc degeneration, lumbosacral region without mention of lumbar back pain or lower extremity pain: Secondary | ICD-10-CM | POA: Insufficient documentation

## 2013-09-08 DIAGNOSIS — M5126 Other intervertebral disc displacement, lumbar region: Secondary | ICD-10-CM | POA: Insufficient documentation

## 2013-09-08 MED ORDER — GADOBENATE DIMEGLUMINE 529 MG/ML IV SOLN
20.0000 mL | Freq: Once | INTRAVENOUS | Status: AC | PRN
  Administered 2013-09-08: 20 mL via INTRAVENOUS

## 2013-09-08 NOTE — ED Provider Notes (Signed)
Medical screening examination/treatment/procedure(s) were performed by non-physician practitioner and as supervising physician I was immediately available for consultation/collaboration.   EKG Interpretation None        Alfonzo Feller, DO 09/08/13 1037

## 2013-09-09 ENCOUNTER — Telehealth: Payer: Self-pay | Admitting: Hematology & Oncology

## 2013-09-09 ENCOUNTER — Telehealth: Payer: Self-pay

## 2013-09-09 NOTE — Telephone Encounter (Signed)
Pt scheduled 7-27 MD appointment. Computer was down on 3-30. No office note or orders for follow up. She has been coming q 4 months. I left message with RN to check if 7-27 ok

## 2013-09-09 NOTE — Telephone Encounter (Signed)
Pt has no insurance and was sent a [employer] application via mail today, to complete and return and/or mail to [facility].

## 2013-09-09 NOTE — Telephone Encounter (Signed)
Called to schedule biopsy, Lucky Rathke gave me [phone] # to call GI IR does those. I left message with pt info to call and schedule appointment. Per voice mail greeting I faxed copy of order [phone]) with cover sheet order in Epic please call Pt to schedule

## 2013-09-09 NOTE — Telephone Encounter (Addendum)
Message copied by Johny Drilling on Wed Sep 09, 2013 11:28 AM ------      Message from: Volanda Napoleon      Created: Tue Sep 08, 2013  8:35 PM       Call - may have a tumor in the left lobe of thyroid.  Need to have a bx done  I will set this up.  Pete ------  Attempted to contact pt with [place] & MRI results. Voicemail not set up. Will attempt to contact pt again. dph

## 2013-09-10 ENCOUNTER — Telehealth: Payer: Self-pay | Admitting: Hematology & Oncology

## 2013-09-10 ENCOUNTER — Telehealth: Payer: Self-pay | Admitting: *Deleted

## 2013-09-10 NOTE — Telephone Encounter (Signed)
Pt has biopsy appointment 4-7 I left message on IR voice mail was checking to see if pt was aware of appointment. No note in system

## 2013-09-10 NOTE — Telephone Encounter (Signed)
Message copied by Rico Ala on Thu Sep 10, 2013  2:21 PM ------      Message from: Volanda Napoleon      Created: Tue Sep 08, 2013  8:35 PM       Call - may have a tumor in the left lobe of thyroid.  Need to have a bx done  I will set this up.  Pete ------

## 2013-09-10 NOTE — Telephone Encounter (Signed)
Dr Marin Olp asked me to let patient know that MRI showed no cancer in the back. She does have have a bulging disc - does not need surgery.  Patient may have tumor in thyroid, will need to have biopsy done

## 2013-09-15 ENCOUNTER — Other Ambulatory Visit (HOSPITAL_COMMUNITY)
Admission: RE | Admit: 2013-09-15 | Discharge: 2013-09-15 | Disposition: A | Discharge status: Home / Self Care | Payer: No Typology Code available for payment source | Source: Ambulatory Visit | Attending: Interventional Radiology | Admitting: Interventional Radiology

## 2013-09-15 ENCOUNTER — Ambulatory Visit
Admission: RE | Admit: 2013-09-15 | Discharge: 2013-09-15 | Disposition: A | Discharge status: Home / Self Care | Payer: No Typology Code available for payment source | Source: Ambulatory Visit | Attending: Hematology & Oncology | Admitting: Hematology & Oncology

## 2013-09-15 DIAGNOSIS — E041 Nontoxic single thyroid nodule: Secondary | ICD-10-CM | POA: Insufficient documentation

## 2013-09-15 DIAGNOSIS — E079 Disorder of thyroid, unspecified: Secondary | ICD-10-CM

## 2013-09-24 ENCOUNTER — Telehealth: Payer: Self-pay | Admitting: *Deleted

## 2013-09-24 NOTE — Telephone Encounter (Signed)
Called patient for Dr. Marin Olp, no obvious cancer noted in biopsy, but may need another biopsy in the future/

## 2013-09-24 NOTE — Telephone Encounter (Signed)
Message copied by Rico Ala on Thu Sep 24, 2013 12:45 PM ------      Message from: Burney Gauze R      Created: Wed Sep 23, 2013  9:21 PM       Call - no obvious cancer, but may need another biopsy in the future.  Pete ------

## 2013-10-03 ENCOUNTER — Encounter (HOSPITAL_COMMUNITY): Payer: Self-pay | Admitting: Emergency Medicine

## 2013-10-03 ENCOUNTER — Emergency Department (HOSPITAL_COMMUNITY): Payer: Self-pay

## 2013-10-03 ENCOUNTER — Emergency Department (HOSPITAL_COMMUNITY)
Admission: EM | Admit: 2013-10-03 | Discharge: 2013-10-03 | Disposition: A | Discharge status: Home / Self Care | Payer: Self-pay | Attending: Emergency Medicine | Admitting: Emergency Medicine

## 2013-10-03 DIAGNOSIS — E079 Disorder of thyroid, unspecified: Secondary | ICD-10-CM

## 2013-10-03 DIAGNOSIS — Z79899 Other long term (current) drug therapy: Secondary | ICD-10-CM | POA: Insufficient documentation

## 2013-10-03 DIAGNOSIS — R070 Pain in throat: Secondary | ICD-10-CM | POA: Insufficient documentation

## 2013-10-03 DIAGNOSIS — Z853 Personal history of malignant neoplasm of breast: Secondary | ICD-10-CM | POA: Insufficient documentation

## 2013-10-03 DIAGNOSIS — E0789 Other specified disorders of thyroid: Secondary | ICD-10-CM | POA: Insufficient documentation

## 2013-10-03 MED ORDER — GLUCAGON HCL (RDNA) 1 MG IJ SOLR
1.0000 mg | Freq: Once | INTRAMUSCULAR | Status: AC
  Administered 2013-10-03: 1 mg via INTRAVENOUS
  Filled 2013-10-03: qty 1

## 2013-10-03 MED ORDER — GI COCKTAIL ~~LOC~~
30.0000 mL | Freq: Once | ORAL | Status: AC
  Administered 2013-10-03: 30 mL via ORAL
  Filled 2013-10-03: qty 30

## 2013-10-03 NOTE — ED Provider Notes (Signed)
CSN: [id number]     Arrival date & time 10/03/13  1525 History   First MD Initiated Contact with Patient 10/03/13 1720     Chief Complaint  Patient presents with  . Foreign body in throat      (Consider location/radiation/quality/duration/timing/severity/associated sxs/prior Treatment) HPI [name] is a 53 y.o. female who presents to emergency department with possible foreign body in her throat. Patient states she has history of narrowed esophagus, followed by Dr. Redmond Baseman. States also has history of thymus gland removal with a mass, and currently being evaluated for a thyroid nodule. States that her symptoms began last night when she was eating chicken. She states she was swallowing piece of chicken and felt like it got stuck in her esophagus. She states she did not try to eat anything after that. She states she did not eat breakfast this morning. She states she did try to a sandwich this afternoon and states she was able to swallow this time which is when she drank water right after swallowing a sandwich she began to "gag." She did not throw up. States she continues to have sensation of something being stuck in her esophagus and she was brought to emergency department for evaluation.  Past Medical History  Diagnosis Date  . Thyroid disease   . Cancer     breast cancer   Past Surgical History  Procedure Laterality Date  . Abdominal hysterectomy    . Mastectomy     Family History  Problem Relation Age of Onset  . Asthma Mother   . Diabetes Father    History  Substance Use Topics  . Smoking status: Never Smoker   . Smokeless tobacco: Never Used     Comment: never used tobacco  . Alcohol Use: No   OB History   Grav Para Term Preterm Abortions TAB SAB Ect Mult Living                 Review of Systems  Constitutional: Negative for fever and chills.  HENT: Positive for trouble swallowing. Negative for sore throat.   Respiratory: Negative for cough, chest tightness and  shortness of breath.   Cardiovascular: Negative for chest pain, palpitations and leg swelling.  Gastrointestinal: Negative for nausea, vomiting, abdominal pain and diarrhea.  Musculoskeletal: Negative for neck pain and neck stiffness.  Skin: Negative for rash.  Neurological: Negative for dizziness, weakness and headaches.  All other systems reviewed and are negative.     Allergies  Aspirin  Home Medications   Prior to Admission medications   Medication Sig Start Date End Date Taking? Authorizing Provider  azithromycin (ZITHROMAX Z-PAK) 250 MG tablet Take one tablet by mouth qd x 6 days. 09/03/13   Volanda Napoleon, MD  fexofenadine (ALLEGRA) 180 MG tablet Take 180 mg by mouth daily as needed for allergies or rhinitis.    Historical Provider, MD  levothyroxine (SYNTHROID) 150 MCG tablet Take 1 tablet (150 mcg total) by mouth daily before breakfast. 08/25/13   Volanda Napoleon, MD  lisinopril-hydrochlorothiazide (PRINZIDE,ZESTORETIC) 10-12.5 MG per tablet Take 1 tablet by mouth daily.    Historical Provider, MD   BP 142/78  Pulse 69  Temp(Src) 97.9 F (36.6 C) (Oral)  Resp 20  SpO2 100% Physical Exam  Nursing note and vitals reviewed. Constitutional: She appears well-developed and well-nourished. No distress.  HENT:  Head: Normocephalic.  Nose: Nose normal.  Mouth/Throat: Oropharynx is clear and moist.  Eyes: Conjunctivae are normal. Pupils are equal, round, and  reactive to light.  Neck: Normal range of motion. Neck supple. No thyromegaly present.  Cardiovascular: Normal rate, regular rhythm and normal heart sounds.   Pulmonary/Chest: Effort normal and breath sounds normal. No respiratory distress. She has no wheezes. She has no rales.  Abdominal: Soft. Bowel sounds are normal. She exhibits no distension. There is no tenderness. There is no rebound.  Musculoskeletal: She exhibits no edema.  Neurological: She is alert.  Skin: Skin is warm and dry.  Psychiatric: She has a normal  mood and affect. Her behavior is normal.    ED Course  Procedures (including critical care time) Labs Review Labs Reviewed - No data to display  Imaging Review Dg Neck Soft Tissue  10/03/2013   CLINICAL DATA:  Dysphagia.  Left thyroid mass.  EXAM: NECK SOFT TISSUES - 1+ VIEW  COMPARISON:  Neck radiographs 08/29/2013. Thyroid ultrasound 09/08/2013 and CT neck 08/29/2013.  FINDINGS: Epiglottis is normal in appearance. Prevertebral soft tissues are within normal limits. The trachea is deviated rightward at the level of the thoracic inlet due to previously documented left thyroid mass. Degenerative disc disease is again seen at C5-6 and C6-7. No radiopaque foreign body is identified. Surgical clips are noted in the mediastinum.  IMPRESSION: No radiopaque foreign body seen. 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   Electronically Signed   By: Logan Bores   On: 10/03/2013 18:10     EKG Interpretation None      MDM   Final diagnoses:  Throat discomfort  Thyroid mass    Patient with foreign body sensation in her esophagus, she is in no distress, swallowing her secretions. Apparently was able to keep down a sandwich and some water earlier. We'll try glucagon, GI cocktail, we'll reassess.  Pt continues to have symptoms, reports pain and discomfort is in the throat, mainly on right side, adjacent to cricoid cartilage. I spoke with Dr. Trilby Leaver with GI, who said he would be happy to see pt on Monday in the office as long as she is able to swallow at this time but said ENT may be of more help if pain is in the throat.  Spoke with Dr. Erik Obey with ENT, advised to call office on Monday and be seen then.   8:17 PM Pt drank 2 cups of water and tolerated multiple crackers in ER. She continues to have discomfort but no respiratory complaints and is keeping food down. Possible scratch to esophagus vs problems due to mass. Discussed plan with pt, will d/c home with follow up on Monday.  instructed to return if worsening.   Filed Vitals:   10/03/13 1539 10/03/13 1930  BP: 142/78 122/80  Pulse: 69 76  Temp: 97.9 F (36.6 C)   TempSrc: Oral   Resp: 20 18  SpO2: 100% 100%       Renold Genta, PA-C 10/03/13 2213

## 2013-10-03 NOTE — ED Notes (Signed)
Pt presents with c/o foreign body in throat. Pt reports that she was eating some chicken around 12:30 this morning and ever since then has felt like she has something stuck in her throat. Pt's voice does appear to sound scratchy and somewhat muffled but pt says this is normal for her r/t throat surgery in 1997. Pt says she has eaten since then but when she attempted to drink water she started to gag. Pt is talking in complete sentences and is in no respiratory distress. 100% on RA.

## 2013-10-03 NOTE — Discharge Instructions (Signed)
Soft diet and fluids. Follow up wit Dr. Noreene Filbert office on Monday if symptoms continue. Return if worsening, if unable to swallow, or any new concerning symptoms.

## 2013-10-04 NOTE — ED Provider Notes (Signed)
Medical screening examination/treatment/procedure(s) were conducted as a shared visit with non-physician practitioner(s) and myself.  I personally evaluated the patient during the encounter.   EKG Interpretation None      Pt c/o fb sensation in throat after eating chicken last evening. Hx same multiple times in past. Also recent dx thyroid mass and in process of workup for that. No trouble breathing, has eaten sandwich and drank fluids since. Voice at baseline per pt. Swallowing liquids. No stridor.   Mirna Mires, MD 10/04/13 [phone]

## 2013-10-07 ENCOUNTER — Telehealth: Payer: Self-pay | Admitting: *Deleted

## 2013-10-07 NOTE — Telephone Encounter (Signed)
Returned pt's call. Pt complained of acid reflux. Dr Marin Olp suggested she take Pepcid 40mg  once daily. Informed pt to call if she has further problems. Pt verbalized understanding.

## 2013-10-27 ENCOUNTER — Other Ambulatory Visit: Payer: Self-pay | Admitting: Nurse Practitioner

## 2013-10-27 DIAGNOSIS — C50912 Malignant neoplasm of unspecified site of left female breast: Secondary | ICD-10-CM | POA: Insufficient documentation

## 2013-10-28 ENCOUNTER — Encounter: Payer: Self-pay | Admitting: Hematology & Oncology

## 2013-10-28 ENCOUNTER — Ambulatory Visit (HOSPITAL_BASED_OUTPATIENT_CLINIC_OR_DEPARTMENT_OTHER): Payer: Medicaid Other | Admitting: Hematology & Oncology

## 2013-10-28 ENCOUNTER — Other Ambulatory Visit (HOSPITAL_BASED_OUTPATIENT_CLINIC_OR_DEPARTMENT_OTHER): Payer: Medicaid Other | Admitting: Lab

## 2013-10-28 VITALS — BP 116/67 | HR 75 | Temp 98.2°F | Resp 16 | Ht 64.0 in | Wt 202.0 lb

## 2013-10-28 DIAGNOSIS — Z853 Personal history of malignant neoplasm of breast: Secondary | ICD-10-CM

## 2013-10-28 DIAGNOSIS — D509 Iron deficiency anemia, unspecified: Secondary | ICD-10-CM

## 2013-10-28 DIAGNOSIS — C50912 Malignant neoplasm of unspecified site of left female breast: Secondary | ICD-10-CM

## 2013-10-28 DIAGNOSIS — E079 Disorder of thyroid, unspecified: Secondary | ICD-10-CM

## 2013-10-28 DIAGNOSIS — I890 Lymphedema, not elsewhere classified: Secondary | ICD-10-CM

## 2013-10-28 LAB — CMP (CANCER CENTER ONLY)
ALT: 11 U/L (ref 10–47)
AST: 19 U/L (ref 11–38)
Albumin: 3.3 g/dL (ref 3.3–5.5)
Alkaline Phosphatase: 59 U/L (ref 26–84)
BILIRUBIN TOTAL: 0.5 mg/dL (ref 0.20–1.60)
BUN, Bld: 11 mg/dL (ref 7–22)
CO2: 27 meq/L (ref 18–33)
Calcium: 8.9 mg/dL (ref 8.0–10.3)
Chloride: 101 mEq/L (ref 98–108)
Creat: 0.9 mg/dl (ref 0.6–1.2)
Glucose, Bld: 98 mg/dL (ref 73–118)
Potassium: 3.2 mEq/L — ABNORMAL LOW (ref 3.3–4.7)
Sodium: 143 mEq/L (ref 128–145)
Total Protein: 7.5 g/dL (ref 6.4–8.1)

## 2013-10-28 LAB — CBC WITH DIFFERENTIAL (CANCER CENTER ONLY)
BASO#: 0 10*3/uL (ref 0.0–0.2)
BASO%: 0.2 % (ref 0.0–2.0)
EOS ABS: 0.1 10*3/uL (ref 0.0–0.5)
EOS%: 2.7 % (ref 0.0–7.0)
HCT: 36.8 % (ref 34.8–46.6)
HGB: 12.5 g/dL (ref 11.6–15.9)
LYMPH#: 1.7 10*3/uL (ref 0.9–3.3)
LYMPH%: 38 % (ref 14.0–48.0)
MCH: 28 pg (ref 26.0–34.0)
MCHC: 34 g/dL (ref 32.0–36.0)
MCV: 82 fL (ref 81–101)
MONO#: 0.3 10*3/uL (ref 0.1–0.9)
MONO%: 5.7 % (ref 0.0–13.0)
NEUT#: 2.3 10*3/uL (ref 1.5–6.5)
NEUT%: 53.4 % (ref 39.6–80.0)
PLATELETS: 250 10*3/uL (ref 145–400)
RBC: 4.47 10*6/uL (ref 3.70–5.32)
RDW: 13.3 % (ref 11.1–15.7)
WBC: 4.4 10*3/uL (ref 3.9–10.0)

## 2013-10-28 LAB — FERRITIN CHCC: FERRITIN: 291 ng/mL — AB (ref 9–269)

## 2013-10-29 ENCOUNTER — Telehealth: Payer: Self-pay | Admitting: Hematology & Oncology

## 2013-10-29 NOTE — Telephone Encounter (Signed)
Called GI to schedule bx they transferred me to IR voice mail I left message need to schedule bx

## 2013-10-29 NOTE — Progress Notes (Signed)
Hematology and Oncology Follow Up Visit  [name] [id number] 11-Feb-1961 53 y.o. 10/29/2013   Principle Diagnosis:   History of locally recurrent mucinous adenocarcinoma of the left breast-16 years disease free  Current Therapy:    Observation     Interim History:  Ms.  Cooley is back for followup. We last saw her about a month or so ago. We did do an MRI. She is having some hoarseness. She does have a left thyroid lobe mass. It measures about 3 x 4 x 3 cm. We will need to get an or sound and biopsy of this.  CT scan did not show any suspicious lymphadenopathy. His MRI of the chest did not show any pulmonary nodules. There is no suspicious lymphadenopathy in the chest. Brachial plexus of the left arm was a normal. She has lymphedema of the left arm. This does cause her problems. It swells up when she tries to use it for any amount of time. She is on some diuretic which helps on occasion.  She has occasional pain down the right leg. This has been going on for a couple months. We will need to get an MRI of the lumbar spine. Mucinous breast cancers can tend to metastasize late.  She's had no fever. She's had no bleeding.  Medications: Current outpatient prescriptions:fexofenadine (ALLEGRA) 180 MG tablet, Take 180 mg by mouth daily as needed for allergies or rhinitis., Disp: , Rfl: ;  levothyroxine (SYNTHROID) 150 MCG tablet, Take 1 tablet (150 mcg total) by mouth daily before breakfast., Disp: 30 tablet, Rfl: 3;  lisinopril-hydrochlorothiazide (PRINZIDE,ZESTORETIC) 10-12.5 MG per tablet, Take 1 tablet by mouth daily., Disp: , Rfl:   Allergies:  Allergies  Allergen Reactions  . Aspirin Swelling    Past Medical History, Surgical history, Social history, and Family History were reviewed and updated.  Review of Systems: As above  Physical Exam:  height is 5\' 4"  (1.626 m) and weight is 202 lb (91.627 kg). Her oral temperature is 98.2 F (36.8 C). Her blood pressure is 116/67  and her pulse is 75. Her respiration is 16.   Somewhat obese Afro-American female. She does have some fullness in the left lower neck. Slight tenderness is noted. There is some slight fullness in the left supraclavicular region. No oral lesions are noted. Ocular exam is unremarkable. Lungs are clear. Cardiac exam regular in rhythm. Abdomen is soft. She has good bowel sounds. There is no fluid wave. There is no palpable liver or spleen tip. Back exam shows some tenderness in the lumbosacral spine. She has some slight spasms in this region. Extremities shows 2+ lymphedema of the left arm. She has decreased range of motion of the left arm and shoulder. There is no erythema. She has slightly decreased strength of the left arm. Right arm or legs are unremarkable. Neurological exam is nonfocal. Lab Results  Component Value Date   WBC 4.4 10/28/2013   HGB 12.5 10/28/2013   HCT 36.8 10/28/2013   MCV 82 10/28/2013   PLT 250 10/28/2013     Chemistry      Component Value Date/Time   NA 143 10/28/2013 1150   NA 141 08/29/2013 1321   K 3.2* 10/28/2013 1150   K 4.0 08/29/2013 1321   CL 101 10/28/2013 1150   CL 102 08/29/2013 1321   CO2 27 10/28/2013 1150   CO2 25 08/29/2013 1321   BUN 11 10/28/2013 1150   BUN 12 08/29/2013 1321   CREATININE 0.9 10/28/2013 1150  CREATININE 0.82 08/29/2013 1321      Component Value Date/Time   CALCIUM 8.9 10/28/2013 1150   CALCIUM 9.5 08/29/2013 1321   ALKPHOS 59 10/28/2013 1150   ALKPHOS 62 12/16/2006 0922   AST 19 10/28/2013 1150   AST 18 12/16/2006 0922   ALT 11 10/28/2013 1150   ALT 14 12/16/2006 0922   BILITOT 0.50 10/28/2013 1150   BILITOT 0.4 12/16/2006 3419         Impression and Plan: [name] is a 53 year old African American female. She is a remote history of locally recurrent mucinous adenocarcinoma of the breast. She underwent radiation for this. She was on tamoxifen. The recurrence was 16 years ago.  I think we may have a new problem with this thyroid mass. We  will have to get this evaluated and biopsied. It certainly would not be totally shocking if this were found to be recurrent mucinous adenocarcinoma. If so, then she will clearly need thyroidectomy and possible radiation with aromatase inhibitor therapy.  We will get the scans set up. I will plan to see her back in another 4-6 weeks.   I spent a good 45 minutes with her today. I went over her scans that she had previously. I went over lab work. I explained why we had to get the thyroid biopsied and why we had to look at the lower back. She understands this and is very thankful for our approach to taking care of her.   Volanda Napoleon, MD 5/21/20156:57 AM

## 2013-11-04 ENCOUNTER — Telehealth: Payer: Self-pay | Admitting: Hematology & Oncology

## 2013-11-04 NOTE — Telephone Encounter (Signed)
Pt aware of 7-28 MD appointment and that scheduling will be calling for week of 7-20 thyroid bx. [name] in scheduling said they would call her closer to the date

## 2014-01-05 ENCOUNTER — Telehealth: Payer: Self-pay | Admitting: Hematology & Oncology

## 2014-01-05 ENCOUNTER — Other Ambulatory Visit: Payer: Self-pay | Admitting: Lab

## 2014-01-05 ENCOUNTER — Ambulatory Visit: Payer: Self-pay | Admitting: Hematology & Oncology

## 2014-01-05 NOTE — Telephone Encounter (Signed)
Pt called rescheduled no show to 8-10 she also said no one has called her about an biopsy appointment. I transferred her to RN voice mail. I informed RN about that.

## 2014-01-11 ENCOUNTER — Other Ambulatory Visit: Payer: Self-pay | Admitting: Hematology & Oncology

## 2014-01-13 ENCOUNTER — Inpatient Hospital Stay: Admission: RE | Admit: 2014-01-13 | Payer: Self-pay | Source: Ambulatory Visit

## 2014-01-14 ENCOUNTER — Other Ambulatory Visit (HOSPITAL_COMMUNITY)
Admission: RE | Admit: 2014-01-14 | Discharge: 2014-01-14 | Disposition: A | Discharge status: Home / Self Care | Payer: No Typology Code available for payment source | Source: Ambulatory Visit | Attending: Interventional Radiology | Admitting: Interventional Radiology

## 2014-01-14 ENCOUNTER — Ambulatory Visit
Admission: RE | Admit: 2014-01-14 | Discharge: 2014-01-14 | Disposition: A | Discharge status: Home / Self Care | Payer: No Typology Code available for payment source | Source: Ambulatory Visit | Attending: Hematology & Oncology | Admitting: Hematology & Oncology

## 2014-01-14 DIAGNOSIS — E0789 Other specified disorders of thyroid: Secondary | ICD-10-CM | POA: Insufficient documentation

## 2014-01-14 DIAGNOSIS — C50912 Malignant neoplasm of unspecified site of left female breast: Secondary | ICD-10-CM

## 2014-01-14 DIAGNOSIS — D509 Iron deficiency anemia, unspecified: Secondary | ICD-10-CM

## 2014-01-14 DIAGNOSIS — E079 Disorder of thyroid, unspecified: Secondary | ICD-10-CM

## 2014-01-18 ENCOUNTER — Ambulatory Visit: Payer: Self-pay | Admitting: Hematology & Oncology

## 2014-01-18 ENCOUNTER — Telehealth: Payer: Self-pay | Admitting: Hematology & Oncology

## 2014-01-18 ENCOUNTER — Other Ambulatory Visit: Payer: Self-pay | Admitting: Lab

## 2014-01-18 NOTE — Telephone Encounter (Signed)
Pt rescheduled 8-10 to 8-25

## 2014-01-20 ENCOUNTER — Telehealth: Payer: Self-pay | Admitting: *Deleted

## 2014-01-20 NOTE — Telephone Encounter (Addendum)
Message copied by Lenn Sink on Wed Jan 20, 2014  9:29 AM ------      Message from: Volanda Napoleon      Created: Tue Jan 19, 2014 10:11 PM       Call - no obvious thyroid cancer!!  pete ------Couldn't leave voicemail because pt's voicemail was not set up.

## 2014-01-20 NOTE — Telephone Encounter (Addendum)
Message copied by Lenn Sink on Wed Jan 20, 2014 10:44 AM ------      Message from: Volanda Napoleon      Created: Tue Jan 19, 2014 10:11 PM       Call - no obvious thyroid cancer!!  pete ------Informed pt that there is no obvious thyroid cancer

## 2014-02-02 ENCOUNTER — Other Ambulatory Visit: Payer: Self-pay | Admitting: Lab

## 2014-02-02 ENCOUNTER — Telehealth: Payer: Self-pay | Admitting: Hematology & Oncology

## 2014-02-02 ENCOUNTER — Ambulatory Visit: Payer: Self-pay | Admitting: Hematology & Oncology

## 2014-02-02 NOTE — Telephone Encounter (Signed)
Patient called and missed 02/02/14 apt due death in her family.  She resch for 02/09/14

## 2014-02-09 ENCOUNTER — Encounter: Payer: Self-pay | Admitting: Hematology & Oncology

## 2014-02-09 ENCOUNTER — Other Ambulatory Visit (HOSPITAL_BASED_OUTPATIENT_CLINIC_OR_DEPARTMENT_OTHER): Payer: Medicaid Other | Admitting: Lab

## 2014-02-09 ENCOUNTER — Ambulatory Visit (HOSPITAL_BASED_OUTPATIENT_CLINIC_OR_DEPARTMENT_OTHER): Payer: Medicaid Other | Admitting: Hematology & Oncology

## 2014-02-09 VITALS — BP 133/85 | HR 16 | Temp 98.2°F | Resp 16 | Ht 64.0 in | Wt 210.0 lb

## 2014-02-09 DIAGNOSIS — E079 Disorder of thyroid, unspecified: Secondary | ICD-10-CM

## 2014-02-09 DIAGNOSIS — E042 Nontoxic multinodular goiter: Secondary | ICD-10-CM

## 2014-02-09 DIAGNOSIS — D509 Iron deficiency anemia, unspecified: Secondary | ICD-10-CM

## 2014-02-09 DIAGNOSIS — Z853 Personal history of malignant neoplasm of breast: Secondary | ICD-10-CM

## 2014-02-09 DIAGNOSIS — C50912 Malignant neoplasm of unspecified site of left female breast: Secondary | ICD-10-CM

## 2014-02-09 LAB — IRON AND TIBC CHCC
%SAT: 22 % (ref 21–57)
IRON: 77 ug/dL (ref 41–142)
TIBC: 349 ug/dL (ref 236–444)
UIBC: 272 ug/dL (ref 120–384)

## 2014-02-09 LAB — CBC WITH DIFFERENTIAL (CANCER CENTER ONLY)
BASO#: 0 10*3/uL (ref 0.0–0.2)
BASO%: 0.2 % (ref 0.0–2.0)
EOS ABS: 0.2 10*3/uL (ref 0.0–0.5)
EOS%: 3.2 % (ref 0.0–7.0)
HCT: 36.6 % (ref 34.8–46.6)
HGB: 12.5 g/dL (ref 11.6–15.9)
LYMPH#: 2.1 10*3/uL (ref 0.9–3.3)
LYMPH%: 35.9 % (ref 14.0–48.0)
MCH: 28.6 pg (ref 26.0–34.0)
MCHC: 34.2 g/dL (ref 32.0–36.0)
MCV: 84 fL (ref 81–101)
MONO#: 0.4 10*3/uL (ref 0.1–0.9)
MONO%: 6.1 % (ref 0.0–13.0)
NEUT#: 3.2 10*3/uL (ref 1.5–6.5)
NEUT%: 54.6 % (ref 39.6–80.0)
Platelets: 232 10*3/uL (ref 145–400)
RBC: 4.37 10*6/uL (ref 3.70–5.32)
RDW: 13.5 % (ref 11.1–15.7)
WBC: 5.9 10*3/uL (ref 3.9–10.0)

## 2014-02-09 LAB — COMPREHENSIVE METABOLIC PANEL
ALT: 15 U/L (ref 0–35)
AST: 16 U/L (ref 0–37)
Albumin: 4.1 g/dL (ref 3.5–5.2)
Alkaline Phosphatase: 70 U/L (ref 39–117)
BILIRUBIN TOTAL: 0.2 mg/dL (ref 0.2–1.2)
BUN: 13 mg/dL (ref 6–23)
CO2: 26 meq/L (ref 19–32)
CREATININE: 0.83 mg/dL (ref 0.50–1.10)
Calcium: 9 mg/dL (ref 8.4–10.5)
Chloride: 106 mEq/L (ref 96–112)
GLUCOSE: 87 mg/dL (ref 70–99)
Potassium: 3.5 mEq/L (ref 3.5–5.3)
Sodium: 138 mEq/L (ref 135–145)
Total Protein: 7.2 g/dL (ref 6.0–8.3)

## 2014-02-09 LAB — FERRITIN CHCC: FERRITIN: 303 ng/mL — AB (ref 9–269)

## 2014-02-09 MED ORDER — INFLUENZA VAC SPLIT QUAD 0.5 ML IM SUSY
0.5000 mL | PREFILLED_SYRINGE | Freq: Once | INTRAMUSCULAR | Status: DC
  Filled 2014-02-09: qty 0.5

## 2014-02-10 ENCOUNTER — Other Ambulatory Visit: Payer: Self-pay | Admitting: Hematology & Oncology

## 2014-02-10 ENCOUNTER — Encounter: Payer: Self-pay | Admitting: Hematology & Oncology

## 2014-02-10 ENCOUNTER — Telehealth: Payer: Self-pay | Admitting: Hematology & Oncology

## 2014-02-10 DIAGNOSIS — Z9012 Acquired absence of left breast and nipple: Secondary | ICD-10-CM

## 2014-02-10 DIAGNOSIS — Z1231 Encounter for screening mammogram for malignant neoplasm of breast: Secondary | ICD-10-CM

## 2014-02-10 NOTE — Telephone Encounter (Signed)
Called to give pt her 9-28 mammogram appointment her phone is out of service. I left message on sisters phone to have pt call. She is listed on release. I also mailed letter with appointment.

## 2014-02-10 NOTE — Progress Notes (Signed)
Hematology and Oncology Follow Up Visit  [name] [id number] Dec 03, 1960 53 y.o. 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  [name] is back for followup. We went ahead and got another biopsy of her left thyroid nodule. She has done back in early August. No surprise, the pathologist could not do assayer diagnosis. They said that "a follicular lesion/neoplasm cannot be entirely ruled out". This I find hard to believe since they had a specimen. I refused to put her through another biopsy. I that we can just follow this thyroid nodule long period. She has the back issues. Unfortunately, she has no insurance. As such, I think having her see orthopedic physician is going to be very difficult. She had an MRI back in March. She has a bulging disc at L5-S1. Her weight certainly is not helping her.  She's not able to get Medicaid right now. She's going to reapply for this.  She's not working. She has discomfort in the left arm. She has some lymphedema in the left arm. She cannot afford a compression sleeve because she has no insurance.  She's had no cough. There's been no nausea vomiting. She's had no change in bowel or bladder habits.  Her last mammogram was a year ago. We need to get her set up another one.  Medications: Current outpatient prescriptions:fexofenadine (ALLEGRA) 180 MG tablet, Take 180 mg by mouth daily as needed for allergies or rhinitis., Disp: , Rfl: ;  levothyroxine (SYNTHROID, LEVOTHROID) 150 MCG tablet, TAKE ONE TABLET BY MOUTH ONCE DAILY BEFORE BREAKFAST, Disp: 30 tablet, Rfl: 0;  lisinopril-hydrochlorothiazide (PRINZIDE,ZESTORETIC) 10-12.5 MG per tablet, Take 1 tablet by mouth daily., Disp: , Rfl:  Current facility-administered medications:Influenza vac split quadrivalent PF (FLUARIX) injection 0.5 mL, 0.5 mL,  Intramuscular, Once, Volanda Napoleon, MD  Allergies:  Allergies  Allergen Reactions  . Aspirin Swelling    Past Medical History, Surgical history, Social history, and Family History were reviewed and updated.  Review of Systems: As above  Physical Exam:  height is 5\' 4"  (1.626 m) and weight is 210 lb (95.255 kg). Her oral temperature is 98.2 F (36.8 C). Her blood pressure is 133/85 and her pulse is 16. Her respiration is 16.   Obese [place] female. Head and exam shows no ocular or oral lesions. She is no palpable cervical or supraclavicular lymph nodes. Lungs are clear. Cardiac exam regular in rhythm with no murmurs rubs or bruits. Abdomen is soft. She has good bowel sounds. There is no fluid wave. There is a palpable liver or spleen tip. Resident shows right breast with no masses, edema or erythema. There is no right axillary adenopathy. Left chest wall is well-healed mastectomy. The left chest wall nodules noted. There is no left axillary adenopathy. Back exam shows no tenderness over the spine, ribs or hips. Extremities shows some chronic lymphedema of the left arm. Right arm is unremarkable. There is no lymphedema the legs. She has good strength. She has good range of motion of her joints. Skin exam no rashes. Neurological exam shows no focal neurological deficits.  Lab Results  Component Value Date   WBC 5.9 02/09/2014   HGB 12.5 02/09/2014   HCT 36.6 02/09/2014   MCV 84 02/09/2014   PLT 232 02/09/2014     Chemistry      Component Value Date/Time  NA 138 02/09/2014 1016   NA 143 10/28/2013 1150   K 3.5 02/09/2014 1016   K 3.2* 10/28/2013 1150   CL 106 02/09/2014 1016   CL 101 10/28/2013 1150   CO2 26 02/09/2014 1016   CO2 27 10/28/2013 1150   BUN 13 02/09/2014 1016   BUN 11 10/28/2013 1150   CREATININE 0.83 02/09/2014 1016   CREATININE 0.9 10/28/2013 1150      Component Value Date/Time   CALCIUM 9.0 02/09/2014 1016   CALCIUM 8.9 10/28/2013 1150   ALKPHOS 70 02/09/2014 1016   ALKPHOS 59  10/28/2013 1150   AST 16 02/09/2014 1016   AST 19 10/28/2013 1150   ALT 15 02/09/2014 1016   ALT 11 10/28/2013 1150   BILITOT 0.2 02/09/2014 1016   BILITOT 0.50 10/28/2013 1150         Impression and Plan: Ms. [name] is a 53 year old female. 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 This really has not a problem for now for over 16 years.  She does need another mammogram. I will have to get this set up.  We will get another ultrasound of her thyroid probably in November.  Hopefully, she can get Medicaid so that she can afford to see a spine doctor. She also needs the medication she can afford a compression stocking for her left arm.  We will plan to get her back to see [place] in 3 months.   Volanda Napoleon, MD 9/2/20157:20 AM

## 2014-02-11 ENCOUNTER — Other Ambulatory Visit: Payer: Self-pay | Admitting: Hematology & Oncology

## 2014-02-11 ENCOUNTER — Telehealth: Payer: Self-pay | Admitting: Hematology & Oncology

## 2014-02-11 NOTE — Telephone Encounter (Signed)
Pt called she is aware of 9-28 mammogram,11-18 [place] and 12-2 MD appointments

## 2014-03-08 ENCOUNTER — Ambulatory Visit: Payer: Self-pay

## 2014-03-24 ENCOUNTER — Ambulatory Visit: Payer: Self-pay

## 2014-03-30 ENCOUNTER — Other Ambulatory Visit: Payer: Self-pay | Admitting: Obstetrics and Gynecology

## 2014-03-30 DIAGNOSIS — Z1231 Encounter for screening mammogram for malignant neoplasm of breast: Secondary | ICD-10-CM

## 2014-04-05 ENCOUNTER — Other Ambulatory Visit: Payer: Self-pay | Admitting: Hematology & Oncology

## 2014-04-17 ENCOUNTER — Encounter (HOSPITAL_COMMUNITY): Payer: Self-pay | Admitting: *Deleted

## 2014-04-17 ENCOUNTER — Emergency Department (HOSPITAL_COMMUNITY)
Admission: EM | Admit: 2014-04-17 | Discharge: 2014-04-17 | Disposition: A | Discharge status: Home / Self Care | Payer: Self-pay | Attending: Emergency Medicine | Admitting: Emergency Medicine

## 2014-04-17 DIAGNOSIS — E079 Disorder of thyroid, unspecified: Secondary | ICD-10-CM | POA: Insufficient documentation

## 2014-04-17 DIAGNOSIS — R0989 Other specified symptoms and signs involving the circulatory and respiratory systems: Secondary | ICD-10-CM

## 2014-04-17 DIAGNOSIS — Z853 Personal history of malignant neoplasm of breast: Secondary | ICD-10-CM | POA: Insufficient documentation

## 2014-04-17 DIAGNOSIS — F458 Other somatoform disorders: Secondary | ICD-10-CM | POA: Insufficient documentation

## 2014-04-17 MED ORDER — GI COCKTAIL ~~LOC~~
30.0000 mL | Freq: Once | ORAL | Status: AC
  Administered 2014-04-17: 30 mL via ORAL
  Filled 2014-04-17: qty 30

## 2014-04-17 MED ORDER — GLUCAGON HCL RDNA (DIAGNOSTIC) 1 MG IJ SOLR
1.0000 mg | Freq: Once | INTRAMUSCULAR | Status: AC
  Administered 2014-04-17: 1 mg via INTRAMUSCULAR
  Filled 2014-04-17: qty 1

## 2014-04-17 NOTE — ED Notes (Signed)
Pt reports she ate a piece of carrot cake yesterday with walnut at about 1400 and has since felt like she has something in her throat. Pt's voice sounds hoarse but reports that is from previous surgery. She is in no distress. She does report that when she drinks water it feels like it moves.

## 2014-04-17 NOTE — ED Provider Notes (Signed)
CSN: [id number]     Arrival date & time 04/17/14  1152 History   First MD Initiated Contact with Patient 04/17/14 1219     Chief Complaint  Patient presents with  . throat issue      (Consider location/radiation/quality/duration/timing/severity/associated sxs/prior Treatment) HPI Comments:  53 y.o. female who presents with complaint of a globus sensation in her throat. She states that she was eating carrot cake yesterday around 2 PM when she felt like she got something stuck in her throat.she denies any pain but did have some gagging about 30 minutes later. She states that she has continued to feel the sensation since yesterday. She is able to drink but has not tried to eat any solid food. She has had similar symptoms several times previously. 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 Redmond Baseman. She was seen for this here previously and got a GI cocktail and glucagon. At that time ENT and GI were consulted but recommended outpatient follow-up with them rather than evaluation in the ER. The duration of her symptoms is constant. The severity is mild. She has had similar symptoms previously. Nothing has relieved her symptoms thus far. She has tried drinking without relief.  The history is provided by the patient.    Past Medical History  Diagnosis Date  . Thyroid disease   . Cancer     breast cancer   Past Surgical History  Procedure Laterality Date  . Abdominal hysterectomy    . Mastectomy     Family History  Problem Relation Age of Onset  . Asthma Mother   . Diabetes Father    History  Substance Use Topics  . Smoking status: Never Smoker   . Smokeless tobacco: Never Used     Comment: never used tobacco  . Alcohol Use: No   OB History    No data available     Review of Systems  Constitutional: Negative for fever and fatigue.  HENT: Negative for congestion and drooling.   Eyes: Negative for pain.  Respiratory: Positive for choking. Negative for  cough.   Gastrointestinal: Negative for nausea, vomiting and diarrhea.  Genitourinary: Negative for dysuria and hematuria.  Musculoskeletal: Negative for back pain, gait problem and neck pain.  Skin: Negative for color change.  Neurological: Negative for dizziness.  Hematological: Negative for adenopathy.  Psychiatric/Behavioral: Negative for behavioral problems.  All other systems reviewed and are negative.     Allergies  Aspirin  Home Medications   Prior to Admission medications   Medication Sig Start Date End Date Taking? Authorizing Provider  fexofenadine (ALLEGRA) 180 MG tablet Take 180 mg by mouth daily as needed for allergies or rhinitis.    Historical Provider, MD  levothyroxine (SYNTHROID, LEVOTHROID) 150 MCG tablet TAKE ONE TABLET BY MOUTH ONCE DAILY BEFORE BREAKFAST 04/05/14   Volanda Napoleon, MD  lisinopril-hydrochlorothiazide (PRINZIDE,ZESTORETIC) 10-12.5 MG per tablet Take 1 tablet by mouth daily.    Historical Provider, MD   BP 123/82 mmHg  Pulse 99  Temp(Src) 98 F (36.7 C) (Oral)  Resp 17  SpO2 96% Physical Exam  Constitutional: She is oriented to person, place, and time. She appears well-developed and well-nourished.  HENT:  Head: Normocephalic.  Mouth/Throat: Oropharynx is clear and moist. No oropharyngeal exudate.  Eyes: Conjunctivae and EOM are normal. Pupils are equal, round, and reactive to light.  Neck: Normal range of motion. Neck supple.  Cardiovascular: Normal rate, regular rhythm, normal heart sounds and intact  distal pulses.  Exam reveals no gallop and no friction rub.   No murmur heard. Pulmonary/Chest: Effort normal and breath sounds normal. No respiratory distress. She has no wheezes.  Abdominal: Soft. Bowel sounds are normal. There is no tenderness. There is no rebound and no guarding.  Musculoskeletal: Normal range of motion. She exhibits no edema or tenderness.  Neurological: She is alert and oriented to person, place, and time.  Skin:  Skin is warm and dry.  Psychiatric: She has a normal mood and affect. Her behavior is normal.  Nursing note and vitals reviewed.   ED Course  Procedures (including critical care time) Labs Review Labs Reviewed - No data to display  Imaging Review No results found.   EKG Interpretation None      MDM   Final diagnoses:  Globus sensation    12:47 PM 53 y.o. female who presents with complaint of a globus sensation in her throat. She states that she was eating carrot cake yesterday around 2 PM when she felt like she got something stuck in her throat.she denies any pain but did have some gagging about 30 minutes later. She states that she has continued to feel the sensation since yesterday. She is able to drink but has not tried to eat any solid food. She has had similar symptoms several times previously. 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 Redmond Baseman. She was seen for this here previously and got a GI cocktail and glucagon. At that time ENT and GI were consulted but recommended outpatient follow-up with them rather than evaluation in the ER. Will try GI cocktail and glucagon and reevaluate the patient. She is handling her secretions and is in no acute distress.  3:01 PM: Pt had mild improvement w/ tx here. Likely mild esophagitis from scratching her throat. Will rec maalox prn.  I have discussed the diagnosis/risks/treatment options with the patient and believe the pt to be eligible for discharge home to follow-up with Dr. Redmond Baseman as needed if sx persist. We also discussed returning to the ED immediately if new or worsening sx occur. We discussed the sx which are most concerning (e.g., inability to tolerate liquids or solids) that necessitate immediate return. Medications administered to the patient during their visit and any new prescriptions provided to the patient are listed below.  Medications given during this visit Medications  gi cocktail  (Maalox,Lidocaine,Donnatal) (30 mLs Oral Given 04/17/14 1323)  glucagon (human recombinant) (GLUCAGEN) injection 1 mg (1 mg Intramuscular Given 04/17/14 1323)    New Prescriptions   No medications on file     Pamella Pert, MD 04/17/14 1612

## 2014-04-17 NOTE — ED Notes (Signed)
MD at bedside. 

## 2014-04-17 NOTE — ED Notes (Addendum)
Pt reports since last night she feels there is something stuck in her throat. Denies pain. No change in voice. No difficulty breathing. Hx of hyperthyroidism.

## 2014-04-17 NOTE — ED Notes (Signed)
Pt ambulated to restroom with steady gait.

## 2014-04-20 ENCOUNTER — Encounter (HOSPITAL_COMMUNITY): Payer: Self-pay | Admitting: Adult Health

## 2014-04-20 ENCOUNTER — Emergency Department (HOSPITAL_COMMUNITY)
Admission: EM | Admit: 2014-04-20 | Discharge: 2014-04-21 | Disposition: A | Discharge status: Home / Self Care | Payer: Self-pay | Attending: Emergency Medicine | Admitting: Emergency Medicine

## 2014-04-20 DIAGNOSIS — E079 Disorder of thyroid, unspecified: Secondary | ICD-10-CM | POA: Insufficient documentation

## 2014-04-20 DIAGNOSIS — Z79899 Other long term (current) drug therapy: Secondary | ICD-10-CM | POA: Insufficient documentation

## 2014-04-20 DIAGNOSIS — Z853 Personal history of malignant neoplasm of breast: Secondary | ICD-10-CM | POA: Insufficient documentation

## 2014-04-20 DIAGNOSIS — F458 Other somatoform disorders: Secondary | ICD-10-CM | POA: Insufficient documentation

## 2014-04-20 DIAGNOSIS — R0989 Other specified symptoms and signs involving the circulatory and respiratory systems: Secondary | ICD-10-CM

## 2014-04-20 MED ORDER — GI COCKTAIL ~~LOC~~
30.0000 mL | Freq: Once | ORAL | Status: AC
  Administered 2014-04-20: 30 mL via ORAL
  Filled 2014-04-20: qty 30

## 2014-04-20 MED ORDER — GLUCAGON HCL RDNA (DIAGNOSTIC) 1 MG IJ SOLR
1.0000 mg | Freq: Once | INTRAMUSCULAR | Status: AC
  Administered 2014-04-20: 1 mg via INTRAMUSCULAR
  Filled 2014-04-20: qty 1

## 2014-04-20 NOTE — ED Notes (Addendum)
Presents with difficulty swallowing and "food getting hung up in my throat, things just don't go down right. 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 Alert and oriented.  Problems began Saturday. And occur while eating or drinking. Neurologically intact.

## 2014-04-20 NOTE — ED Provider Notes (Signed)
CSN: [id number]     Arrival date & time 04/20/14  1824 History   First MD Initiated Contact with Patient 04/20/14 2238     Chief Complaint  Patient presents with  . Dysphagia     (Consider location/radiation/quality/duration/timing/severity/associated sxs/prior Treatment) HPI Comments: The patient is a 53 year old female presents emergency room this chief complaint of. Patient reports onset of discomfort while eating lasagna tonight. The patient reports similar symptoms in the past, has not follow-up with Dr. Redmond Baseman due to lack of insurance. She reports moderate resolution of symptoms with glucagon in the past. Patient denies chest pain or throat pain. Denies nausea or vomiting.  The history is provided by the patient. No language interpreter was used.    Past Medical History  Diagnosis Date  . Thyroid disease   . Cancer     breast cancer   Past Surgical History  Procedure Laterality Date  . Abdominal hysterectomy    . Mastectomy    . Esophageal dilation     Family History  Problem Relation Age of Onset  . Asthma Mother   . Diabetes Father    History  Substance Use Topics  . Smoking status: Never Smoker   . Smokeless tobacco: Never Used     Comment: never used tobacco  . Alcohol Use: No   OB History    No data available     Review of Systems  Constitutional: Negative for fever and chills.  HENT: Positive for trouble swallowing and voice change.   Respiratory: Negative for chest tightness and shortness of breath.       Allergies  Aspirin  Home Medications   Prior to Admission medications   Medication Sig Start Date End Date Taking? Authorizing Provider  fexofenadine (ALLEGRA) 180 MG tablet Take 180 mg by mouth daily as needed for allergies or rhinitis.   Yes Historical Provider, MD  levothyroxine (SYNTHROID, LEVOTHROID) 150 MCG tablet Take 150 mcg by mouth daily before breakfast.   Yes Historical Provider, MD  lisinopril-hydrochlorothiazide  (PRINZIDE,ZESTORETIC) 10-12.5 MG per tablet Take 1 tablet by mouth every evening.    Yes Historical Provider, MD  levothyroxine (SYNTHROID, LEVOTHROID) 150 MCG tablet TAKE ONE TABLET BY MOUTH ONCE DAILY BEFORE BREAKFAST Patient not taking: Reported on 04/20/2014 04/05/14   Volanda Napoleon, MD   BP 121/73 mmHg  Pulse 80  Temp(Src) 97.8 F (36.6 C) (Oral)  Resp 18  SpO2 100% Physical Exam  Constitutional: She is oriented to person, place, and time. She appears well-developed and well-nourished. No distress.  Pt sleeping upon entering exam room.  HENT:  Head: Normocephalic and atraumatic.  Mouth/Throat: Uvula is midline and oropharynx is clear and moist.  Handling secretions   Eyes: EOM are normal.  Neck: Neck supple.  Cardiovascular: Normal rate and regular rhythm.   Pulmonary/Chest: Effort normal and breath sounds normal. No stridor. She has no wheezes.  Abdominal: Soft. There is no tenderness. There is no rebound and no guarding.  Neurological: She is alert and oriented to person, place, and time.  Skin: Skin is warm. She is not diaphoretic.  Psychiatric: She has a normal mood and affect. Her behavior is normal.  Nursing note and vitals reviewed.   ED Course  Procedures (including critical care time) Labs Review Labs Reviewed - No data to display  Imaging Review No results found.   EKG Interpretation None      MDM   Final diagnoses:  Globus sensation   Patient presents with dysphasia/globulus feeling in throat.  Patient has had multiple visits the ED for similar complaints. Patient is handling secretions no stridor, no shortness of breath. Patient has not followed up with ENT specialist due to lack of insurance. Plan to treat with glucagon and GI cocktail and reevaluate. Reevaluation patient sleeping in exam room. Patient reports moderate resolution of symptoms with medication. Discussed treatment plan with the patient, advised Protonix follow up as out-pt. Return  precautions given. Reports understanding and no other concerns at this time.  Patient is stable for discharge at this time.  Meds given in ED:  Medications  gi cocktail (Maalox,Lidocaine,Donnatal) (30 mLs Oral Given 04/20/14 2254)  glucagon (human recombinant) (GLUCAGEN) injection 1 mg (1 mg Intramuscular Given 04/20/14 2256)    Discharge Medication List as of 04/21/2014 12:55 AM    START taking these medications   Details  omeprazole (PRILOSEC) 20 MG capsule Take 1 capsule (20 mg total) by mouth daily., Starting 04/21/2014, Until Discontinued, Print        Harvie Heck, PA-C 04/22/14 0002  Carmin Muskrat, MD 04/22/14 2318

## 2014-04-21 MED ORDER — OMEPRAZOLE 20 MG PO CPDR: 20.0000 mg | DELAYED_RELEASE_CAPSULE | Freq: Every day | ORAL | Status: DC

## 2014-04-21 NOTE — Discharge Instructions (Signed)
Call for a follow up appointment with a Family or Primary Care Provider.  Call Dr. Redmond Baseman for further evaluation of your symptoms.  Return if Symptoms worsen.   Take medication as prescribed.

## 2014-04-21 NOTE — ED Notes (Signed)
Pt A&OX4, ambulatory at d/c with steady gait, NAD 

## 2014-04-28 ENCOUNTER — Ambulatory Visit (HOSPITAL_COMMUNITY)
Admission: RE | Admit: 2014-04-28 | Discharge: 2014-04-28 | Disposition: A | Discharge status: Home / Self Care | Payer: Self-pay | Source: Ambulatory Visit | Attending: Hematology & Oncology | Admitting: Hematology & Oncology

## 2014-04-28 DIAGNOSIS — E042 Nontoxic multinodular goiter: Secondary | ICD-10-CM | POA: Insufficient documentation

## 2014-04-28 DIAGNOSIS — C50912 Malignant neoplasm of unspecified site of left female breast: Secondary | ICD-10-CM | POA: Insufficient documentation

## 2014-04-29 ENCOUNTER — Telehealth: Payer: Self-pay | Admitting: *Deleted

## 2014-04-29 NOTE — Telephone Encounter (Signed)
-----   Message from Volanda Napoleon, MD sent at 04/28/2014  6:37 PM EST ----- PLEASE CALL AND LET her KNOW THAT THE THYROID NODULE IS AT SEE A LITTLE BIT SMALLER. tHERE IS NO NEED FOR ANY BIOPSY. Laurey Arrow

## 2014-05-05 ENCOUNTER — Ambulatory Visit (HOSPITAL_COMMUNITY)
Admission: RE | Admit: 2014-05-05 | Discharge: 2014-05-05 | Disposition: A | Discharge status: Home / Self Care | Payer: Self-pay | Source: Ambulatory Visit | Attending: Obstetrics and Gynecology | Admitting: Obstetrics and Gynecology

## 2014-05-05 ENCOUNTER — Encounter (HOSPITAL_COMMUNITY): Payer: Self-pay

## 2014-05-05 VITALS — BP 142/86 | Ht 64.0 in | Wt 214.8 lb

## 2014-05-05 DIAGNOSIS — Z1239 Encounter for other screening for malignant neoplasm of breast: Secondary | ICD-10-CM

## 2014-05-05 DIAGNOSIS — Z1231 Encounter for screening mammogram for malignant neoplasm of breast: Secondary | ICD-10-CM

## 2014-05-05 HISTORY — DX: Essential (primary) hypertension: I10

## 2014-05-05 HISTORY — DX: Gastro-esophageal reflux disease without esophagitis: K21.9

## 2014-05-05 NOTE — Addendum Note (Signed)
Encounter addended by: Loletta Parish, RN on: 05/05/2014 10:56 AM<BR>     Documentation filed: Notes Section

## 2014-05-05 NOTE — Patient Instructions (Signed)
Explained to [name] that she did not need a Pap smear today due to her history of a hysterectomy for benign reasons. Let patient know that she does not need any further Pap smears. Let [facility] know the [facility] will follow up with her within the next couple weeks with results by letter or phone. [name] verbalized understanding. Patient escorted to mammography for a screening mammogram.   Dajon Lazar, Arvil Chaco, RN 10:49 AM

## 2014-05-05 NOTE — Progress Notes (Addendum)
No complaints today.  Pap Smear: Pap smear not completed today. Last Pap smear was in 2007 and normal per patient. Per patient has no history of an abnormal Pap smear. Patient has a history of a hysterectomy for fibroids in May 2007. Patient no longer needs Pap smears due to her history of a hysterectomy for benign reasons per BCCCP and ACOG guidelines. No Pap smear results in EPIC.  Physical exam: Breasts Patient has a history of a left breast mastectomy for breast cancer in 1995. No skin abnormalities bilateral breasts. No nipple retraction right breast. No nipple discharge right breast. No lymphadenopathy. No lumps palpated bilateral breasts. No complaints of pain or tenderness on exam. Patient escorted to mammography for a screening mammogram.        Pelvic/Bimanual No Pap smear completed today since patient has a history of a hysterectomy for benign reasons Pap smear not indicated per BCCCP guidelines.

## 2014-05-10 ENCOUNTER — Other Ambulatory Visit: Payer: Self-pay | Admitting: Hematology & Oncology

## 2014-05-12 ENCOUNTER — Other Ambulatory Visit: Payer: Self-pay | Admitting: Lab

## 2014-05-12 ENCOUNTER — Ambulatory Visit: Payer: Self-pay | Admitting: Hematology & Oncology

## 2014-05-21 ENCOUNTER — Other Ambulatory Visit: Payer: Self-pay

## 2014-05-21 ENCOUNTER — Ambulatory Visit (HOSPITAL_BASED_OUTPATIENT_CLINIC_OR_DEPARTMENT_OTHER): Payer: Self-pay

## 2014-05-21 VITALS — BP 130/88 | HR 78 | Temp 98.2°F | Resp 18 | Ht 63.0 in | Wt 211.7 lb

## 2014-05-21 DIAGNOSIS — Z0000 Encounter for general adult medical examination without abnormal findings: Secondary | ICD-10-CM

## 2014-05-21 LAB — HEMOGLOBIN A1C
HEMOGLOBIN A1C: 6.3 % — AB (ref <5.7)
MEAN PLASMA GLUCOSE: 134 mg/dL — AB (ref <117)

## 2014-05-21 LAB — LIPID PANEL
Cholesterol: 225 mg/dL — ABNORMAL HIGH (ref 0–200)
HDL: 58 mg/dL (ref >39)
LDL Cholesterol: 143 mg/dL — ABNORMAL HIGH (ref 0–99)
Total CHOL/HDL Ratio: 3.9 Ratio
Triglycerides: 121 mg/dL (ref <150)
VLDL: 24 mg/dL (ref 0–40)

## 2014-05-21 LAB — GLUCOSE (CC13): Glucose: 94 mg/dl (ref 70–140)

## 2014-05-21 NOTE — Progress Notes (Signed)
Patient is a new patient to the [facility] program and is currently a [facility] patient effective 05/05/2014.   Clinical Measurements: Patient is 5 ft. 3 inches, weight 211.7 lbs, waist circumference 42 inches, and hip circumference 48.5 inches.   Medical History: Patient has no history of high cholesterol or diabetes.Patient does have a history of hypertension. She is presently on Lisinopril/HCTZ but did not take this AM because of fasting and does not like to take on an empty stomach. Per patient no diagnosed history of coronary heart disease, heart attack, heart failure, stroke/TIA, vascular disease or congenital heart defects.   Blood Pressure, Self-measurement: Patient states that does not have equipment to check Blood pressure and does not have it checked except at doctors office.   Nutrition Assessment: Patient stated that eats 1 fruit every day. Patient states she eats 2 to 3 servings of vegetables a day. Per patient does not eat 3 or more ounces of whole grains daily. Patient stated doesn't eat two or more servings of fish weekly, only one. Patient states she does not drink more than 36 ounces or 450 calories of beverages with added sugars weekly. Patient stated she does watch her salt intake.   Physical Activity Assessment: Patient states that walks and household chores for 400 minute a week.Patient states does no vigorous exercise. Smoking Status: Patient had never smoked and is not around smoke.  Quality of Life Assessment: In assessing patient's quality of life she stated that out of the past 30 days that she has felt her Physical health was good all days. Patient also stated that in the past 30 days that her mental health is good including stress, depression and problems with emotions for all days. Patient did state that out of the past 30 days she felt her physical or mental health had not kept her from doing her usual activities including self-care, work or recreation.   Plan: Lab work  will be done today including a lipid panel, blood glucose, and Hgb A1C. Will call lab results when they are finished.   Will give health counseling 05/24/14 with all options.

## 2014-05-21 NOTE — Patient Instructions (Signed)
Discussed health assessment with patient. Will decide on programs she wants when call lab results. Let patient know that will call her on Monday with lab results and make appointment at [facility] if needed. Informed not to let [facility] do any labs or procedures until goes to eligibility and get orange card or patient assistance. Patient verbalized understanding.

## 2014-05-24 ENCOUNTER — Telehealth: Payer: Self-pay

## 2014-05-24 NOTE — Telephone Encounter (Signed)
Called to inform about lab work from 05/21/14. I informed patient: cholesterol- 225, HDL- 58, LDL- 143, triglycerides - 121, Bld Glucose -94 and HBG-A1C - 6.3.  Set up health Coaching for December 17 at 9:30 AM at cancer center for nutrition and activity.

## 2014-05-27 ENCOUNTER — Ambulatory Visit: Payer: Self-pay

## 2014-05-27 DIAGNOSIS — Z789 Other specified health status: Secondary | ICD-10-CM

## 2014-05-27 NOTE — Patient Instructions (Signed)
Patient will follow 1600 calorie diet plan. Will review all handouts and exercise/activity book. Will do [place] assessments and set goals. Will introduce some of [place] exercises into daily routine. Will increase exercise and use pedometer. Will measure portion sizes. Will call if has any questions. Will call patient in three weeks. Patient verbalized understanding.

## 2014-05-27 NOTE — Progress Notes (Signed)
Patient returns today for Health Coaching regarding Nutrition for her borderline AIC, cholesterol, LDL's and activity.   NUTRITION: Together patient and I went over lab values and BMI. Discussed that BMI (37.6) was in the obese rang. Patient and I reviewed A1C handout to see about prediabetes, normal and  diabetes range. Patient went over what prediabetes is, complications that can result if do not get levels to normal, and diabetes level. Discussed increasing fiber in diet, reading labels and serving sizes.  Discussed watching carbohydrates, how to count them, serving sizes and number of carbs per day allowance. Patient reviewed weight /activity chart. Per patient she does moderate activity and does not eat much. She states that does not drink beverages with added sugars. Discussed changing over to artificial sweetened drinks. Discussed the importance of serving sizes, counting carbohydrates and increasing fiber in diet. Patient went over 1,600 cal diet and we broke it down to the number of servings she could have. Patient with assistance did a few assessments in [employer]. Assessment were explained how works and how set goals based on assessments. Patient voiced that does not want to go on medication for cholesterol or A1C. Patient received and reviewed the following handouts: My Plate, Carb counting Menu, prediabetes, A1C Exam, Diabetes Meal Plan Basics,1600 calorie meal plan [employer] [place] Book (cook book &. [employer] Activity). Gave patient measuring cup to measure serving sizes and demonstrated the serving sizes. Patient, also, received water bottle and magnet.  ACTIVITY: Discussed activity and walking Patient discussed other activity. Reviewed [place] Activity booklet and received a stretch . Received pedometer and encourage to walk 7,500 steps three times a week. Discussed places to get group exercise like: [employer], [employer] or [employer]  PLAN: Patient will increase amount of  walking per day and add other exercises to plan, Decrease carbohydrates in diet. Lose weight. Follow up times three per phone unless needs and can call.

## 2014-06-02 ENCOUNTER — Emergency Department (HOSPITAL_COMMUNITY)
Admission: EM | Admit: 2014-06-02 | Discharge: 2014-06-02 | Disposition: A | Discharge status: Home / Self Care | Payer: Self-pay | Attending: Emergency Medicine | Admitting: Emergency Medicine

## 2014-06-02 ENCOUNTER — Emergency Department (HOSPITAL_COMMUNITY): Payer: Self-pay

## 2014-06-02 ENCOUNTER — Encounter (HOSPITAL_COMMUNITY): Payer: Self-pay | Admitting: Emergency Medicine

## 2014-06-02 DIAGNOSIS — R05 Cough: Secondary | ICD-10-CM

## 2014-06-02 DIAGNOSIS — I10 Essential (primary) hypertension: Secondary | ICD-10-CM | POA: Insufficient documentation

## 2014-06-02 DIAGNOSIS — Z79899 Other long term (current) drug therapy: Secondary | ICD-10-CM | POA: Insufficient documentation

## 2014-06-02 DIAGNOSIS — E079 Disorder of thyroid, unspecified: Secondary | ICD-10-CM | POA: Insufficient documentation

## 2014-06-02 DIAGNOSIS — K219 Gastro-esophageal reflux disease without esophagitis: Secondary | ICD-10-CM | POA: Insufficient documentation

## 2014-06-02 DIAGNOSIS — R059 Cough, unspecified: Secondary | ICD-10-CM

## 2014-06-02 DIAGNOSIS — Z853 Personal history of malignant neoplasm of breast: Secondary | ICD-10-CM | POA: Insufficient documentation

## 2014-06-02 DIAGNOSIS — B349 Viral infection, unspecified: Secondary | ICD-10-CM | POA: Insufficient documentation

## 2014-06-02 DIAGNOSIS — H6593 Unspecified nonsuppurative otitis media, bilateral: Secondary | ICD-10-CM | POA: Insufficient documentation

## 2014-06-02 LAB — COMPREHENSIVE METABOLIC PANEL
ALK PHOS: 80 U/L (ref 39–117)
ALT: 17 U/L (ref 0–35)
ANION GAP: 9 (ref 5–15)
AST: 24 U/L (ref 0–37)
Albumin: 4.1 g/dL (ref 3.5–5.2)
BILIRUBIN TOTAL: 0.5 mg/dL (ref 0.3–1.2)
BUN: 10 mg/dL (ref 6–23)
CALCIUM: 8.9 mg/dL (ref 8.4–10.5)
CO2: 23 mmol/L (ref 19–32)
CREATININE: 0.71 mg/dL (ref 0.50–1.10)
Chloride: 108 mEq/L (ref 96–112)
GFR calc Af Amer: >90 mL/min (ref >90)
GFR calc non Af Amer: >90 mL/min (ref >90)
Glucose, Bld: 133 mg/dL — ABNORMAL HIGH (ref 70–99)
Potassium: 4.1 mmol/L (ref 3.5–5.1)
Sodium: 140 mmol/L (ref 135–145)
Total Protein: 7.9 g/dL (ref 6.0–8.3)

## 2014-06-02 LAB — CBC WITH DIFFERENTIAL/PLATELET
BASOS ABS: 0 10*3/uL (ref 0.0–0.1)
BASOS PCT: 0 % (ref 0–1)
EOS ABS: 0.1 10*3/uL (ref 0.0–0.7)
EOS PCT: 2 % (ref 0–5)
HEMATOCRIT: 38.9 % (ref 36.0–46.0)
Hemoglobin: 13 g/dL (ref 12.0–15.0)
Lymphocytes Relative: 24 % (ref 12–46)
Lymphs Abs: 1.6 10*3/uL (ref 0.7–4.0)
MCH: 27.9 pg (ref 26.0–34.0)
MCHC: 33.4 g/dL (ref 30.0–36.0)
MCV: 83.5 fL (ref 78.0–100.0)
MONO ABS: 0.3 10*3/uL (ref 0.1–1.0)
Monocytes Relative: 5 % (ref 3–12)
Neutro Abs: 4.8 10*3/uL (ref 1.7–7.7)
Neutrophils Relative %: 69 % (ref 43–77)
Platelets: 239 10*3/uL (ref 150–400)
RBC: 4.66 MIL/uL (ref 3.87–5.11)
RDW: 13.1 % (ref 11.5–15.5)
WBC: 6.9 10*3/uL (ref 4.0–10.5)

## 2014-06-02 LAB — LIPASE, BLOOD: Lipase: 16 U/L (ref 11–59)

## 2014-06-02 MED ORDER — PREDNISONE 20 MG PO TABS: 40.0000 mg | ORAL_TABLET | Freq: Every day | ORAL | Status: DC

## 2014-06-02 MED ORDER — SODIUM CHLORIDE 0.9 % IV SOLN
1000.0000 mL | Freq: Once | INTRAVENOUS | Status: AC
  Administered 2014-06-02: 1000 mL via INTRAVENOUS

## 2014-06-02 MED ORDER — ALBUTEROL SULFATE (2.5 MG/3ML) 0.083% IN NEBU
5.0000 mg | INHALATION_SOLUTION | RESPIRATORY_TRACT | Status: AC
  Administered 2014-06-02: 5 mg via RESPIRATORY_TRACT
  Filled 2014-06-02: qty 6

## 2014-06-02 MED ORDER — IPRATROPIUM BROMIDE 0.02 % IN SOLN
0.5000 mg | RESPIRATORY_TRACT | Status: AC
  Administered 2014-06-02: 0.5 mg via RESPIRATORY_TRACT
  Filled 2014-06-02: qty 2.5

## 2014-06-02 MED ORDER — ONDANSETRON HCL 4 MG PO TABS: 4.0000 mg | ORAL_TABLET | Freq: Four times a day (QID) | ORAL | Status: DC

## 2014-06-02 MED ORDER — ONDANSETRON HCL 4 MG/2ML IJ SOLN
4.0000 mg | Freq: Once | INTRAMUSCULAR | Status: AC
  Administered 2014-06-02: 4 mg via INTRAVENOUS
  Filled 2014-06-02: qty 2

## 2014-06-02 MED ORDER — ALBUTEROL SULFATE HFA 108 (90 BASE) MCG/ACT IN AERS: 2.0000 | INHALATION_SPRAY | RESPIRATORY_TRACT | Status: DC | PRN

## 2014-06-02 NOTE — ED Notes (Signed)
Patient aware urine sample is needed.  

## 2014-06-02 NOTE — ED Notes (Signed)
Patient transported to X-ray 

## 2014-06-02 NOTE — ED Notes (Signed)
Patient is aware a urine sample is needed, patient will notify us if a urine sample is needed.

## 2014-06-02 NOTE — ED Provider Notes (Signed)
CSN: [id number]     Arrival date & time 06/02/14  1606 History   First MD Initiated Contact with Patient 06/02/14 1609     Chief Complaint  Patient presents with  . Extremity Weakness  . Nausea  . Emesis   (Consider location/radiation/quality/duration/timing/severity/associated sxs/prior Treatment) HPI [name] is a 53 yo female presenting with onset of cough this am.  She has had nasal congestion, and sinus pressure and fullness in her ears x 1 week. When she woke up this am she had a productive cough with yellow and green sputum.  She also reports feeling nauseated and 3 episodes of vomiting. She has generalized fatigue.  She denies any pain, fevers, chills, bloody/bilious emesis or diarrhea.   Past Medical History  Diagnosis Date  . Thyroid disease   . Cancer     breast cancer  . Hypertension   . Acid reflux    Past Surgical History  Procedure Laterality Date  . Abdominal hysterectomy    . Mastectomy    . Esophageal dilation     Family History  Problem Relation Age of Onset  . Asthma Mother   . Diabetes Father   . Breast cancer Paternal Aunt    History  Substance Use Topics  . Smoking status: Never Smoker   . Smokeless tobacco: Never Used     Comment: never used tobacco  . Alcohol Use: No   OB History    Gravida Para Term Preterm AB TAB SAB Ectopic Multiple Living   2 2 2       2      Review of Systems  Constitutional: Positive for fatigue. Negative for fever and chills.  HENT: Positive for congestion, rhinorrhea and sinus pressure. Negative for sore throat.   Eyes: Negative for visual disturbance.  Respiratory: Positive for cough. Negative for shortness of breath.   Cardiovascular: Negative for chest pain and leg swelling.  Gastrointestinal: Positive for nausea and vomiting. Negative for diarrhea.  Genitourinary: Negative for dysuria.  Musculoskeletal: Negative for myalgias.  Skin: Negative for rash.  Neurological: Negative for weakness, numbness  and headaches.    Allergies  Aspirin  Home Medications   Prior to Admission medications   Medication Sig Start Date End Date Taking? Authorizing Provider  fexofenadine (ALLEGRA) 180 MG tablet Take 180 mg by mouth daily as needed for allergies or rhinitis.   Yes Historical Provider, MD  levothyroxine (SYNTHROID, LEVOTHROID) 150 MCG tablet TAKE ONE TABLET BY MOUTH ONCE DAILY BEFORE  BREAKFAST 05/10/14  Yes Volanda Napoleon, MD  lisinopril-hydrochlorothiazide (PRINZIDE,ZESTORETIC) 10-12.5 MG per tablet Take 1 tablet by mouth every evening.    Yes Historical Provider, MD  omeprazole (PRILOSEC) 20 MG capsule Take 1 capsule (20 mg total) by mouth daily. 04/21/14  Yes Lauren Parker, PA-C   BP 158/90 mmHg  Pulse 109  Temp(Src) 98.8 F (37.1 C) (Oral)  Resp 18  SpO2 95% Physical Exam  Constitutional: She appears well-developed and well-nourished. No distress.  HENT:  Head: Normocephalic and atraumatic.  Right Ear: Tympanic membrane is bulging. Tympanic membrane is not erythematous. No middle ear effusion.  Left Ear: Tympanic membrane is bulging. Tympanic membrane is not erythematous.  No middle ear effusion.  Mouth/Throat: Oropharynx is clear and moist. No oropharyngeal exudate.  Eyes: Conjunctivae are normal.  Neck: Neck supple. No thyromegaly present.  Cardiovascular: Normal rate, regular rhythm and intact distal pulses.   Pulmonary/Chest: Effort normal. No respiratory distress. She has no decreased breath sounds. She has wheezes in  the right middle field, the right lower field, the left middle field and the left lower field. She has no rhonchi. She has no rales. She exhibits no tenderness.  Abdominal: Soft. There is no tenderness.  Musculoskeletal: She exhibits no tenderness.  Lymphadenopathy:    She has no cervical adenopathy.  Neurological: She is alert.  Skin: Skin is warm and dry. No rash noted. She is not diaphoretic.  Psychiatric: She has a normal mood and affect.  Nursing note  and vitals reviewed.   ED Course  Procedures (including critical care time) Labs Review Labs Reviewed  COMPREHENSIVE METABOLIC PANEL - Abnormal; Notable for the following:    Glucose, Bld 133 (*)    All other components within normal limits  CBC WITH DIFFERENTIAL  LIPASE, BLOOD    Imaging Review Dg Chest 2 View  06/02/2014   CLINICAL DATA:  Cough for 2 weeks  EXAM: CHEST  2 VIEW  COMPARISON:  08/29/2013  FINDINGS: Cardiac shadow is within normal limits. Postsurgical changes are again seen with mild volume loss in a left lung and scarring stable from the prior exam. No acute infiltrate or sizable effusion is seen.  IMPRESSION: Postoperative changes on the left.  No acute abnormality is noted.   Electronically Signed   By: Inez Catalina M.D.   On: 06/02/2014 17:01     EKG Interpretation None      MDM   Final diagnoses:  Cough  Viral illness   53 yo with report of URI symptoms x 1 week and cough and nausea vomiting today.  She has no adb pain or tenderness anywhere.    CBC, CMP, Lipase, CXR, Neb tx.  NS bolus and zofran.  Labs and CXR reviewed, no significant abnormality. Pt symptoms improved after neb and IVF.  She is afebrile, vital signs stable, no abd tenderness and no pneumonia on CXR. Discussed with pt this is most likely viral illness. She is able to tolerate PO fluids in the ED without difficulty.  Pt has follow-up appointment tomorrow with PCP.  Return precautions provided.  PT aware of plan and in agreement.     Filed Vitals:   06/02/14 1614 06/02/14 1701 06/02/14 1752 06/02/14 1853  BP: 158/90  139/76 132/79  Pulse: 109  92 92  Temp: 98.8 F (37.1 C)  98.8 F (37.1 C) 98.6 F (37 C)  TempSrc: Oral  Oral Oral  Resp: 18  20 14   SpO2: 95% 100% 99% 98%   Meds given in ED:  Medications  ondansetron (ZOFRAN) injection 4 mg (4 mg Intravenous Given 06/02/14 1618)  0.9 %  sodium chloride infusion (0 mLs Intravenous Stopped 06/02/14 1844)  albuterol (PROVENTIL)  (2.5 MG/3ML) 0.083% nebulizer solution 5 mg (5 mg Nebulization Given 06/02/14 1701)  ipratropium (ATROVENT) nebulizer solution 0.5 mg (0.5 mg Nebulization Given 06/02/14 1701)    Discharge Medication List as of 06/02/2014  6:27 PM    START taking these medications   Details  ondansetron (ZOFRAN) 4 MG tablet Take 1 tablet (4 mg total) by mouth every 6 (six) hours., Starting 06/02/2014, Until Discontinued, Print    predniSONE (DELTASONE) 20 MG tablet Take 2 tablets (40 mg total) by mouth daily., Starting 06/02/2014, Until Discontinued, Print    albuterol (PROVENTIL HFA;VENTOLIN HFA) 108 (90 BASE) MCG/ACT inhaler Inhale 2 puffs into the lungs every 4 (four) hours as needed for wheezing or shortness of breath., Starting 06/02/2014, Until Discontinued, Print           Benjamine Mola  Iaan Oregel, NP 06/03/14 [place], MD 06/05/14 2300

## 2014-06-02 NOTE — ED Notes (Addendum)
Pt states he has had a cough x 2 weeks and this morning she started having n/v and weakness. Denies any pain at the moment. Pt has hx of breast cancer, cancer is gone per pt.

## 2014-06-02 NOTE — ED Notes (Signed)
Patient was put on a bed pan and was unable to go. Will try once fluids have gone through more.

## 2014-06-02 NOTE — Discharge Instructions (Signed)
Please follow the directions provided. Be sure to follow-up with your primary care provider to ensure you are getting better.  Rest, drink plenty of fluids. Use the zofran to help with nausea.  Take the prednisone daily and use the albuterol as needed for cough. Don't hesitate to return for any new, worsening or concerning symptoms.        SEEK IMMEDIATE MEDICAL CARE IF:  You have severe headaches, shortness of breath, chest pain, neck pain, or an unusual rash.  You have uncontrolled vomiting, diarrhea, or you are unable to keep down fluids.  You or your child has an oral temperature above 102 F (38.9 C), not controlled by medicine.  Your baby is older than 3 months with a rectal temperature of 102 F (38.9 C) or higher.

## 2014-06-03 ENCOUNTER — Other Ambulatory Visit (HOSPITAL_BASED_OUTPATIENT_CLINIC_OR_DEPARTMENT_OTHER): Payer: Medicaid Other | Admitting: Lab

## 2014-06-03 ENCOUNTER — Encounter: Payer: Self-pay | Admitting: Hematology & Oncology

## 2014-06-03 ENCOUNTER — Ambulatory Visit (HOSPITAL_BASED_OUTPATIENT_CLINIC_OR_DEPARTMENT_OTHER): Payer: Medicaid Other | Admitting: Hematology & Oncology

## 2014-06-03 DIAGNOSIS — C50912 Malignant neoplasm of unspecified site of left female breast: Secondary | ICD-10-CM

## 2014-06-03 DIAGNOSIS — Z853 Personal history of malignant neoplasm of breast: Secondary | ICD-10-CM

## 2014-06-03 DIAGNOSIS — E041 Nontoxic single thyroid nodule: Secondary | ICD-10-CM

## 2014-06-03 DIAGNOSIS — E042 Nontoxic multinodular goiter: Secondary | ICD-10-CM

## 2014-06-03 MED ORDER — ALBUTEROL SULFATE HFA 108 (90 BASE) MCG/ACT IN AERS: 2.0000 | INHALATION_SPRAY | RESPIRATORY_TRACT | Status: DC | PRN

## 2014-06-03 MED ORDER — OMEPRAZOLE 20 MG PO CPDR: 20.0000 mg | DELAYED_RELEASE_CAPSULE | Freq: Every day | ORAL | Status: DC

## 2014-06-03 NOTE — Progress Notes (Signed)
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 She went to the ED last night with SOB, cough and headache. Her chest x-ray was clear. She is on a prednisone dose pack and an albuterol inhaler. She denies fever, chills, n/v, rash, dizziness, chest pain, palpitations, abdominal pain, constipation, diarrhea, blood in urine or stool. No bleeding. She still has some back pain but it is a little better.  No tenderness, numbness or tingling in her extremities.   She has had no issues with her thyroid. She is is still trying to get signed up for medicaid.  She is not working. She has discomfort and swelling at times in the left arm. She cannot afford a compression sleeve because she has no insurance. She also need a new prosthetic.  Her mammogram in November was negative for malignancy or abnormality.   CURRENT TREATMENT: Observation  REVIEW OF SYSTEMS: All other 10 point review of systems is negative.   PAST MEDICAL HISTORY: Past Medical History  Diagnosis Date  . Thyroid disease   . Cancer     breast cancer  . Hypertension   . Acid reflux     PAST SURGICAL HISTORY: Past Surgical History  Procedure Laterality Date  . Abdominal hysterectomy    . Mastectomy    . Esophageal dilation      FAMILY HISTORY Family History  Problem Relation Age of Onset  . Asthma Mother   . Diabetes Father   . Breast cancer Paternal Aunt     GYNECOLOGIC HISTORY:  No LMP recorded. Patient has had a hysterectomy.   SOCIAL HISTORY: History   Social History  . Marital Status: Single    Spouse Name: N/A    Number of Children: N/A  . Years of  Education: N/A   Occupational History  . Not on file.   Social History Main Topics  . Smoking status: Never Smoker   . Smokeless tobacco: Never Used     Comment: never used tobacco  . Alcohol Use: No  . Drug Use: No  . Sexual Activity: Yes    Birth Control/ Protection: Surgical   Other Topics Concern  . Not on file   Social History Narrative    ADVANCED DIRECTIVES:  <no information>  HEALTH MAINTENANCE: History  Substance Use Topics  . Smoking status: Never Smoker   . Smokeless tobacco: Never Used     Comment: never used tobacco  . Alcohol Use: No   Colonoscopy: PAP: Bone density: Lipid panel:  Allergies  Allergen Reactions  . Aspirin Swelling    Current Outpatient Prescriptions  Medication Sig Dispense Refill  . albuterol (PROVENTIL HFA;VENTOLIN HFA) 108 (90 BASE) MCG/ACT inhaler Inhale 2 puffs into the lungs every 4 (four) hours as needed for wheezing or shortness of breath. 1 Inhaler 3  . fexofenadine (ALLEGRA) 180 MG tablet Take 180 mg by mouth daily as needed for allergies or rhinitis.    Marland Kitchen levothyroxine (SYNTHROID, LEVOTHROID) 150 MCG tablet TAKE ONE TABLET BY MOUTH ONCE DAILY BEFORE  BREAKFAST 30 tablet 0  . lisinopril-hydrochlorothiazide (PRINZIDE,ZESTORETIC) 10-12.5 MG per tablet Take 1 tablet by mouth every evening.     . ondansetron (ZOFRAN) 4 MG tablet Take 1 tablet (  4 mg total) by mouth every 6 (six) hours. 12 tablet 0  . predniSONE (DELTASONE) 20 MG tablet Take 2 tablets (40 mg total) by mouth daily. 10 tablet 0  . omeprazole (PRILOSEC) 20 MG capsule Take 1 capsule (20 mg total) by mouth daily. (Patient not taking: Reported on 06/03/2014) 30 capsule 0   No current facility-administered medications for this visit.    OBJECTIVE: Filed Vitals:   06/03/14 1010  BP: 134/75  Pulse: 95  Temp: 98.3 F (36.8 C)  Resp: 16    Filed Weights   06/03/14 1010  Weight: 211 lb (95.709 kg)   ECOG FS:0 - Asymptomatic Ocular: Sclerae unicteric, pupils  equal, round and reactive to light Ear-nose-throat: Oropharynx clear, dentition fair Lymphatic: No cervical or supraclavicular adenopathy Lungs no rales or rhonchi, good excursion bilaterally Heart regular rate and rhythm, no murmur appreciated Abd soft, nontender, positive bowel sounds MSK no focal spinal tenderness, no joint edema Neuro: non-focal, well-oriented, appropriate affect Breasts: No mass, lesions, rash or lymphadenopathy.   LAB RESULTS: CMP     Component Value Date/Time   NA 140 06/02/2014 1625   NA 143 10/28/2013 1150   K 4.1 06/02/2014 1625   K 3.2* 10/28/2013 1150   CL 108 06/02/2014 1625   CL 101 10/28/2013 1150   CO2 23 06/02/2014 1625   CO2 27 10/28/2013 1150   GLUCOSE 133* 06/02/2014 1625   GLUCOSE 94 05/21/2014 1022   GLUCOSE 98 10/28/2013 1150   BUN 10 06/02/2014 1625   BUN 11 10/28/2013 1150   CREATININE 0.71 06/02/2014 1625   CREATININE 0.9 10/28/2013 1150   CALCIUM 8.9 06/02/2014 1625   CALCIUM 8.9 10/28/2013 1150   PROT 7.9 06/02/2014 1625   PROT 7.5 10/28/2013 1150   ALBUMIN 4.1 06/02/2014 1625   AST 24 06/02/2014 1625   AST 19 10/28/2013 1150   ALT 17 06/02/2014 1625   ALT 11 10/28/2013 1150   ALKPHOS 80 06/02/2014 1625   ALKPHOS 59 10/28/2013 1150   BILITOT 0.5 06/02/2014 1625   BILITOT 0.50 10/28/2013 1150   GFRNONAA >90 06/02/2014 1625   GFRAA >90 06/02/2014 1625   INo results found for: SPEP, UPEP Lab Results  Component Value Date   WBC 6.9 06/02/2014   NEUTROABS 4.8 06/02/2014   HGB 13.0 06/02/2014   HCT 38.9 06/02/2014   MCV 83.5 06/02/2014   PLT 239 06/02/2014   Lab Results  Component Value Date   LABCA2 15 07/14/2013   No components found for: ATFTD322 No results for input(s): INR in the last 168 hours. Urinalysis No results found for: COLORURINE, APPEARANCEUR, LABSPEC, PHURINE, GLUCOSEU, HGBUR, BILIRUBINUR, KETONESUR, PROTEINUR, UROBILINOGEN, NITRITE, LEUKOCYTESUR STUDIES: Dg Chest 2 View  06/02/2014   CLINICAL  DATA:  Cough for 2 weeks  EXAM: CHEST  2 VIEW  COMPARISON:  08/29/2013  FINDINGS: Cardiac shadow is within normal limits. Postsurgical changes are again seen with mild volume loss in a left lung and scarring stable from the prior exam. No acute infiltrate or sizable effusion is seen.  IMPRESSION: Postoperative changes on the left.  No acute abnormality is noted.   Electronically Signed   By: Inez Catalina M.D.   On: 06/02/2014 17:01   Ms Digital Screening Unilat R  05/05/2014   CLINICAL DATA:  Screening.  EXAM: DIGITAL SCREENING UNILATERAL RIGHT MAMMOGRAM WITH CAD  COMPARISON:  Previous exam(s)  ACR Breast Density Category a: The breast tissue is almost entirely fatty.  FINDINGS: There are no findings suspicious for  malignancy. Images were processed with CAD.  IMPRESSION: No mammographic evidence of malignancy. A result letter of this screening mammogram will be mailed directly to the patient.  RECOMMENDATION: Screening mammogram in one year. (Code:SM-B-01Y)  BI-RADS CATEGORY  1: Negative.   Electronically Signed   By: Lillia Mountain M.D.   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 This has not a problem for over 16 years. Her mammogram in November was negative.  I refilled her omeprazole.  Ultrasound of her thyroid in November showed nodule to be slightly smaller. We will continue to monitor this. Hopefully she will get approved for medicaid soon. This will certainly alleviate a lot of stress from her.  We will see her back in 6 months for labs and follow-up.  She knows to call here with any questions or concerns and to go to the ED in the event of an emergency. We cab certainly see her sooner if need be.   Eliezer Bottom, NP 06/03/2014 10:41 AM

## 2014-06-09 ENCOUNTER — Other Ambulatory Visit: Payer: Self-pay | Admitting: Family

## 2014-06-09 ENCOUNTER — Telehealth: Payer: Self-pay | Admitting: *Deleted

## 2014-06-09 DIAGNOSIS — J019 Acute sinusitis, unspecified: Secondary | ICD-10-CM | POA: Insufficient documentation

## 2014-06-09 MED ORDER — AZITHROMYCIN 250 MG PO TABS: ORAL_TABLET | ORAL | Status: DC

## 2014-06-09 NOTE — Telephone Encounter (Signed)
Patient called saying that her ears were congested and causing dizziness. She states that her ears had fluid in them at her last visit with [place] on 12/24. Spoke to the NP and she will send in a prescription. Called the patient and made her aware.

## 2014-06-12 ENCOUNTER — Other Ambulatory Visit: Payer: Self-pay | Admitting: Hematology & Oncology

## 2014-06-15 ENCOUNTER — Ambulatory Visit: Payer: Self-pay | Admitting: Hematology & Oncology

## 2014-06-15 ENCOUNTER — Other Ambulatory Visit: Payer: Self-pay | Admitting: Lab

## 2014-07-21 ENCOUNTER — Other Ambulatory Visit: Payer: Self-pay | Admitting: Hematology & Oncology

## 2014-08-30 ENCOUNTER — Other Ambulatory Visit: Payer: Self-pay | Admitting: Hematology & Oncology

## 2014-09-02 ENCOUNTER — Other Ambulatory Visit: Payer: Self-pay | Admitting: *Deleted

## 2014-10-10 ENCOUNTER — Other Ambulatory Visit: Payer: Self-pay | Admitting: Hematology & Oncology

## 2014-10-29 ENCOUNTER — Telehealth: Payer: Self-pay

## 2014-10-29 NOTE — Telephone Encounter (Signed)
Left message to return call 

## 2014-12-01 ENCOUNTER — Other Ambulatory Visit: Payer: Self-pay | Admitting: Hematology & Oncology

## 2014-12-03 ENCOUNTER — Ambulatory Visit: Payer: Self-pay | Admitting: Hematology & Oncology

## 2014-12-03 ENCOUNTER — Other Ambulatory Visit: Payer: Self-pay

## 2014-12-15 ENCOUNTER — Ambulatory Visit (HOSPITAL_BASED_OUTPATIENT_CLINIC_OR_DEPARTMENT_OTHER): Payer: Medicaid Other | Admitting: Hematology & Oncology

## 2014-12-15 ENCOUNTER — Other Ambulatory Visit (HOSPITAL_BASED_OUTPATIENT_CLINIC_OR_DEPARTMENT_OTHER): Payer: Medicaid Other

## 2014-12-15 ENCOUNTER — Encounter: Payer: Self-pay | Admitting: Hematology & Oncology

## 2014-12-15 VITALS — BP 131/84 | HR 78 | Temp 98.1°F | Resp 16 | Ht 63.0 in | Wt 217.0 lb

## 2014-12-15 DIAGNOSIS — Z853 Personal history of malignant neoplasm of breast: Secondary | ICD-10-CM

## 2014-12-15 DIAGNOSIS — E041 Nontoxic single thyroid nodule: Secondary | ICD-10-CM

## 2014-12-15 DIAGNOSIS — E032 Hypothyroidism due to medicaments and other exogenous substances: Secondary | ICD-10-CM

## 2014-12-15 DIAGNOSIS — J45901 Unspecified asthma with (acute) exacerbation: Secondary | ICD-10-CM

## 2014-12-15 DIAGNOSIS — C50912 Malignant neoplasm of unspecified site of left female breast: Secondary | ICD-10-CM

## 2014-12-15 DIAGNOSIS — I890 Lymphedema, not elsewhere classified: Secondary | ICD-10-CM

## 2014-12-15 LAB — CBC WITH DIFFERENTIAL (CANCER CENTER ONLY)
BASO#: 0 10*3/uL (ref 0.0–0.2)
BASO%: 0.4 % (ref 0.0–2.0)
EOS%: 2.7 % (ref 0.0–7.0)
Eosinophils Absolute: 0.1 10*3/uL (ref 0.0–0.5)
HCT: 36.4 % (ref 34.8–46.6)
HEMOGLOBIN: 12.5 g/dL (ref 11.6–15.9)
LYMPH#: 1.9 10*3/uL (ref 0.9–3.3)
LYMPH%: 39.6 % (ref 14.0–48.0)
MCH: 28.4 pg (ref 26.0–34.0)
MCHC: 34.3 g/dL (ref 32.0–36.0)
MCV: 83 fL (ref 81–101)
MONO#: 0.3 10*3/uL (ref 0.1–0.9)
MONO%: 6.8 % (ref 0.0–13.0)
NEUT%: 50.5 % (ref 39.6–80.0)
NEUTROS ABS: 2.5 10*3/uL (ref 1.5–6.5)
Platelets: 234 10*3/uL (ref 145–400)
RBC: 4.4 10*6/uL (ref 3.70–5.32)
RDW: 13.6 % (ref 11.1–15.7)
WBC: 4.9 10*3/uL (ref 3.9–10.0)

## 2014-12-15 LAB — COMPREHENSIVE METABOLIC PANEL (CC13)
ALK PHOS: 81 U/L (ref 40–150)
ALT: 13 U/L (ref 0–55)
ANION GAP: 8 meq/L (ref 3–11)
AST: 16 U/L (ref 5–34)
Albumin: 3.8 g/dL (ref 3.5–5.0)
BUN: 13 mg/dL (ref 7.0–26.0)
CO2: 22 meq/L (ref 22–29)
CREATININE: 0.8 mg/dL (ref 0.6–1.1)
Calcium: 9 mg/dL (ref 8.4–10.4)
Chloride: 108 mEq/L (ref 98–109)
EGFR: >90 mL/min/{1.73_m2} (ref >90)
GLUCOSE: 101 mg/dL (ref 70–140)
Potassium: 4.1 mEq/L (ref 3.5–5.1)
SODIUM: 139 meq/L (ref 136–145)
TOTAL PROTEIN: 7.2 g/dL (ref 6.4–8.3)
Total Bilirubin: 0.31 mg/dL (ref 0.20–1.20)

## 2014-12-15 LAB — LIPASE: LIPASE: 50 U/L (ref 0–75)

## 2014-12-15 MED ORDER — ALBUTEROL SULFATE HFA 108 (90 BASE) MCG/ACT IN AERS: 2.0000 | INHALATION_SPRAY | RESPIRATORY_TRACT | Status: DC | PRN

## 2014-12-15 NOTE — Progress Notes (Signed)
Hematology and Oncology Follow Up Visit  [name] [id number] 1960/07/24 54 y.o. 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 She is doing okay. The properties be the lymphedema in her left arm. She just has a lot of swelling. She cannot afford a compression sleeve.  She cannot work because the arm is swollen and painful and gets worse whenever she tries to use it.  Thankfully, her thyroid is not a problem. She's had multiple thyroid biopsies and so far no evidence of malignancy has been found.  She's had no cough. She's had no nausea or vomiting. She's had no change in bowel or latter habits. arm. She has some lymphedema in the left arm. She cannot afford a compression sleeve because she has no insurance.  She's had no cough. There's been no nausea vomiting. She's had no change in bowel or bladder habits.  Her last mammogram was back in November. Everything looked okay. She needs have another one in November. I'm sure that the mammogram Center will call her.    Medications:  Current outpatient prescriptions:  .  albuterol (PROVENTIL HFA;VENTOLIN HFA) 108 (90 BASE) MCG/ACT inhaler, Inhale 2 puffs into the lungs every 4 (four) hours as needed for wheezing or shortness of breath., Disp: 1 Inhaler, Rfl: 3 .  fexofenadine (ALLEGRA) 180 MG tablet, Take 180 mg by mouth daily as needed for allergies or rhinitis., Disp: , Rfl:  .  levothyroxine (SYNTHROID, LEVOTHROID) 150 MCG tablet, TAKE ONE TABLET BY MOUTH ONCE DAILY BEFORE BREAKFAST., Disp: 30 tablet, Rfl: 0 .  lisinopril-hydrochlorothiazide (PRINZIDE,ZESTORETIC) 10-12.5 MG per tablet, Take 1 tablet by mouth every evening. , Disp: , Rfl:  .  omeprazole (PRILOSEC) 20 MG capsule, Take 1 capsule (20 mg total) by mouth daily., Disp: 30  capsule, Rfl: 6  Allergies:  Allergies  Allergen Reactions  . Aspirin Swelling    Past Medical History, Surgical history, Social history, and Family History were reviewed and updated.  Review of Systems: As above  Physical Exam:  height is 5\' 3"  (1.6 m) and weight is 217 lb (98.431 kg). Her oral temperature is 98.1 F (36.7 C). Her blood pressure is 131/84 and her pulse is 78. Her respiration is 16.   Obese African [place] female. Head and exam shows no ocular or oral lesions. She is no palpable cervical or supraclavicular lymph nodes. Lungs are clear. Cardiac exam regular in rhythm with no murmurs rubs or bruits. Abdomen is soft. She has good bowel sounds. There is no fluid wave. There is a palpable liver or spleen tip. Breast exam shows right breast with no masses, edema or erythema. There is no right axillary adenopathy. Left chest wall is well-healed mastectomy. The left chest wall nodules noted. There is no left axillary adenopathy. Back exam shows no tenderness over the spine, ribs or hips. Extremities shows some chronic severe lymphedema of the left arm. Right arm is unremarkable. There is no lymphedema the legs. She has good strength. She has good range of motion of her joints. Skin exam no rashes. Neurological exam shows no focal neurological deficits.  Lab Results  Component Value Date   WBC 4.9 12/15/2014   HGB 12.5 12/15/2014   HCT 36.4 12/15/2014   MCV 83 12/15/2014   PLT 234 12/15/2014  Chemistry      Component Value Date/Time   NA 140 06/02/2014 1625   NA 143 10/28/2013 1150   K 4.1 06/02/2014 1625   K 3.2* 10/28/2013 1150   CL 108 06/02/2014 1625   CL 101 10/28/2013 1150   CO2 23 06/02/2014 1625   CO2 27 10/28/2013 1150   BUN 10 06/02/2014 1625   BUN 11 10/28/2013 1150   CREATININE 0.71 06/02/2014 1625   CREATININE 0.9 10/28/2013 1150      Component Value Date/Time   CALCIUM 8.9 06/02/2014 1625   CALCIUM 8.9 10/28/2013 1150   ALKPHOS 80 06/02/2014 1625    ALKPHOS 59 10/28/2013 1150   AST 24 06/02/2014 1625   AST 19 10/28/2013 1150   ALT 17 06/02/2014 1625   ALT 11 10/28/2013 1150   BILITOT 0.5 06/02/2014 1625   BILITOT 0.50 10/28/2013 1150         Impression and Plan: [name] is a 54 year old African-American female. She has a remote history of recurrent adenocarcinoma of the left rest. This is probably 17 years ago. There is no evidence of recurrence.  Her problem continues to be the lymphedema of the left arm. There's really nothing that we can do for this. It keeps her from working. Hopefully, she'll be able to get disability for this.   We will plan to get her back to see [place] in 6 months.   Volanda Napoleon, MD 7/6/20169:36 AM

## 2014-12-22 ENCOUNTER — Telehealth: Payer: Self-pay

## 2014-12-22 NOTE — Telephone Encounter (Signed)
Second time calling. Left message to return call.

## 2015-01-03 ENCOUNTER — Encounter (HOSPITAL_COMMUNITY): Payer: Self-pay

## 2015-01-03 ENCOUNTER — Emergency Department (HOSPITAL_COMMUNITY)
Admission: EM | Admit: 2015-01-03 | Discharge: 2015-01-03 | Disposition: A | Discharge status: Home / Self Care | Payer: Self-pay | Attending: Emergency Medicine | Admitting: Emergency Medicine

## 2015-01-03 DIAGNOSIS — Z79899 Other long term (current) drug therapy: Secondary | ICD-10-CM | POA: Insufficient documentation

## 2015-01-03 DIAGNOSIS — I10 Essential (primary) hypertension: Secondary | ICD-10-CM | POA: Insufficient documentation

## 2015-01-03 DIAGNOSIS — K219 Gastro-esophageal reflux disease without esophagitis: Secondary | ICD-10-CM | POA: Insufficient documentation

## 2015-01-03 DIAGNOSIS — Z853 Personal history of malignant neoplasm of breast: Secondary | ICD-10-CM | POA: Insufficient documentation

## 2015-01-03 DIAGNOSIS — E079 Disorder of thyroid, unspecified: Secondary | ICD-10-CM | POA: Insufficient documentation

## 2015-01-03 DIAGNOSIS — H81399 Other peripheral vertigo, unspecified ear: Secondary | ICD-10-CM | POA: Insufficient documentation

## 2015-01-03 LAB — BASIC METABOLIC PANEL
Anion gap: 5 (ref 5–15)
BUN: 11 mg/dL (ref 6–20)
CHLORIDE: 109 mmol/L (ref 101–111)
CO2: 25 mmol/L (ref 22–32)
CREATININE: 0.77 mg/dL (ref 0.44–1.00)
Calcium: 8.7 mg/dL — ABNORMAL LOW (ref 8.9–10.3)
GFR calc Af Amer: >60 mL/min (ref >60)
GFR calc non Af Amer: >60 mL/min (ref >60)
Glucose, Bld: 87 mg/dL (ref 65–99)
Potassium: 3.8 mmol/L (ref 3.5–5.1)
Sodium: 139 mmol/L (ref 135–145)

## 2015-01-03 LAB — CBC WITH DIFFERENTIAL/PLATELET
BASOS PCT: 0 % (ref 0–1)
Basophils Absolute: 0 10*3/uL (ref 0.0–0.1)
EOS PCT: 3 % (ref 0–5)
Eosinophils Absolute: 0.2 10*3/uL (ref 0.0–0.7)
HCT: 38.5 % (ref 36.0–46.0)
Hemoglobin: 12.8 g/dL (ref 12.0–15.0)
LYMPHS ABS: 2.2 10*3/uL (ref 0.7–4.0)
LYMPHS PCT: 37 % (ref 12–46)
MCH: 27.9 pg (ref 26.0–34.0)
MCHC: 33.2 g/dL (ref 30.0–36.0)
MCV: 84.1 fL (ref 78.0–100.0)
Monocytes Absolute: 0.3 10*3/uL (ref 0.1–1.0)
Monocytes Relative: 5 % (ref 3–12)
NEUTROS ABS: 3.3 10*3/uL (ref 1.7–7.7)
NEUTROS PCT: 55 % (ref 43–77)
Platelets: 246 10*3/uL (ref 150–400)
RBC: 4.58 MIL/uL (ref 3.87–5.11)
RDW: 13.8 % (ref 11.5–15.5)
WBC: 6 10*3/uL (ref 4.0–10.5)

## 2015-01-03 MED ORDER — MECLIZINE HCL 50 MG PO TABS: 25.0000 mg | ORAL_TABLET | Freq: Three times a day (TID) | ORAL | Status: DC | PRN

## 2015-01-03 MED ORDER — PSEUDOEPHEDRINE HCL ER 120 MG PO TB12: 120.0000 mg | ORAL_TABLET | Freq: Two times a day (BID) | ORAL | Status: DC

## 2015-01-03 NOTE — Discharge Instructions (Signed)
Epley Maneuver Self-Care WHAT IS THE EPLEY MANEUVER? The Epley maneuver is an exercise you can do to relieve symptoms of benign paroxysmal positional vertigo (BPPV). This condition is often just referred to as vertigo. BPPV is caused by the movement of tiny crystals (canaliths) inside your inner ear. The accumulation and movement of canaliths in your inner ear causes a sudden spinning sensation (vertigo) when you move your head to certain positions. Vertigo usually lasts about 30 seconds. BPPV usually occurs in just one ear. If you get vertigo when you lie on your left side, you probably have BPPV in your left ear. Your health care provider can tell you which ear is involved.  BPPV may be caused by a head injury. Many people older than 50 get BPPV for unknown reasons. If you have been diagnosed with BPPV, your health care provider may teach you how to do this maneuver. BPPV is not life threatening (benign) and usually goes away in time.  WHEN SHOULD I PERFORM THE EPLEY MANEUVER? You can do this maneuver at home whenever you have symptoms of vertigo. You may do the Epley maneuver up to 3 times a day until your symptoms of vertigo go away. HOW SHOULD I DO THE EPLEY MANEUVER?  Sit on the edge of a bed or table with your back straight. Your legs should be extended or hanging over the edge of the bed or table.   Turn your head halfway toward the affected ear.   Lie backward quickly with your head turned until you are lying flat on your back. You may want to position a pillow under your shoulders.   Hold this position for 30 seconds. You may experience an attack of vertigo. This is normal. Hold this position until the vertigo stops.  Then turn your head to the opposite direction until your unaffected ear is facing the floor.   Hold this position for 30 seconds. You may experience an attack of vertigo. This is normal. Hold this position until the vertigo stops.  Now turn your whole body to the same  side as your head. Hold for another 30 seconds.   You can then sit back up. ARE THERE RISKS TO THIS MANEUVER? In some cases, you may have other symptoms (such as changes in your vision, weakness, or numbness). If you have these symptoms, stop doing the maneuver and call your health care provider. Even if doing these maneuvers relieves your vertigo, you may still have dizziness. Dizziness is the sensation of light-headedness but without the sensation of movement. Even though the Epley maneuver may relieve your vertigo, it is possible that your symptoms will return within 5 years. WHAT SHOULD I DO AFTER THIS MANEUVER? After doing the Epley maneuver, you can return to your normal activities. Ask your doctor if there is anything you should do at home to prevent vertigo. This may include:  Sleeping with two or more pillows to keep your head elevated.  Not sleeping on the side of your affected ear.  Getting up slowly from bed.  Avoiding sudden movements during the day.  Avoiding extreme head movement, like looking up or bending over.  Wearing a cervical collar to prevent sudden head movements. WHAT SHOULD I DO IF MY SYMPTOMS GET WORSE? Call your health care provider if your vertigo gets worse. Call your provider right way if you have other symptoms, including:   Nausea.  Vomiting.  Headache.  Weakness.  Numbness.  Vision changes. Document Released: 06/02/2013 Document Reviewed: 06/02/2013 Exit[employer]  Patient Information 2015 [place]. This information is not intended to replace advice given to you by your health care provider. Make sure you discuss any questions you have with your health care provider.  Vertigo Vertigo means you feel like you or your surroundings are moving when they are not. Vertigo can be dangerous if it occurs when you are at work, driving, or performing difficult activities.  CAUSES  Vertigo occurs when there is a conflict of signals sent to your brain from  the visual and sensory systems in your body. There are many different causes of vertigo, including:  Infections, especially in the inner ear.  A bad reaction to a drug or misuse of alcohol and medicines.  Withdrawal from drugs or alcohol.  Rapidly changing positions, such as lying down or rolling over in bed.  A migraine headache.  Decreased blood flow to the brain.  Increased pressure in the brain from a head injury, infection, tumor, or bleeding. SYMPTOMS  You may feel as though the world is spinning around or you are falling to the ground. Because your balance is upset, vertigo can cause nausea and vomiting. You may have involuntary eye movements (nystagmus). DIAGNOSIS  Vertigo is usually diagnosed by physical exam. If the cause of your vertigo is unknown, your caregiver may perform imaging tests, such as an MRI scan (magnetic resonance imaging). TREATMENT  Most cases of vertigo resolve on their own, without treatment. Depending on the cause, your caregiver may prescribe certain medicines. If your vertigo is related to body position issues, your caregiver may recommend movements or procedures to correct the problem. In rare cases, if your vertigo is caused by certain inner ear problems, you may need surgery. HOME CARE INSTRUCTIONS   Follow your caregiver's instructions.  Avoid driving.  Avoid operating heavy machinery.  Avoid performing any tasks that would be dangerous to you or others during a vertigo episode.  Tell your caregiver if you notice that certain medicines seem to be causing your vertigo. Some of the medicines used to treat vertigo episodes can actually make them worse in some people. SEEK IMMEDIATE MEDICAL CARE IF:   Your medicines do not relieve your vertigo or are making it worse.  You develop problems with talking, walking, weakness, or using your arms, hands, or legs.  You develop severe headaches.  Your nausea or vomiting continues or gets worse.  You  develop visual changes.  A family member notices behavioral changes.  Your condition gets worse. MAKE SURE YOU:  Understand these instructions.  Will watch your condition.  Will get help right away if you are not doing well or get worse. Document Released: 03/07/2005 Document Revised: 08/20/2011 Document Reviewed: 12/14/2010 [facility] Patient Information 2015 [place], [place]. This information is not intended to replace advice given to you by your health care provider. Make sure you discuss any questions you have with your health care provider.

## 2015-01-03 NOTE — ED Notes (Signed)
Pt c/o weakness, dizziness, and L ear pain x 2 weeks.  Pain score 5/10.  Pt reports dizziness increases w/ moving head.

## 2015-01-03 NOTE — ED Provider Notes (Signed)
CSN: [id number]     Arrival date & time 01/03/15  1215 History   First MD Initiated Contact with Patient 01/03/15 1300     Chief Complaint  Patient presents with  . Weakness  . Dizziness  . Otalgia   HPI Pt has had some dizziness off an on when she turns certain positions over the last couple of weeks.  She has noticed the room spinning.  The sx are not constant.  She also has had some left ear pain.   No fevers or chills.  No vomiting or diarrhea.  She has had general malaise.  Some sinus congestion. Past Medical History  Diagnosis Date  . Thyroid disease   . Cancer     breast cancer  . Hypertension   . Acid reflux    Past Surgical History  Procedure Laterality Date  . Abdominal hysterectomy    . Mastectomy    . Esophageal dilation     Family History  Problem Relation Age of Onset  . Asthma Mother   . Diabetes Father   . Breast cancer Paternal Aunt    History  Substance Use Topics  . Smoking status: Never Smoker   . Smokeless tobacco: Never Used     Comment: never used tobacco  . Alcohol Use: No   OB History    Gravida Para Term Preterm AB TAB SAB Ectopic Multiple Living   2 2 2       2      Review of Systems  All other systems reviewed and are negative.     Allergies  Aspirin  Home Medications   Prior to Admission medications   Medication Sig Start Date End Date Taking? Authorizing Provider  acetaminophen (TYLENOL) 500 MG tablet Take 1,000 mg by mouth every 6 (six) hours as needed.   Yes Historical Provider, MD  albuterol (PROVENTIL HFA;VENTOLIN HFA) 108 (90 BASE) MCG/ACT inhaler Inhale 2 puffs into the lungs every 4 (four) hours as needed for wheezing or shortness of breath. 12/15/14  Yes Volanda Napoleon, MD  fexofenadine (ALLEGRA) 180 MG tablet Take 180 mg by mouth daily as needed for allergies or rhinitis.   Yes Historical Provider, MD  levothyroxine (SYNTHROID, LEVOTHROID) 150 MCG tablet TAKE ONE TABLET BY MOUTH ONCE DAILY BEFORE BREAKFAST. 12/01/14  Yes  Volanda Napoleon, MD  lisinopril-hydrochlorothiazide (PRINZIDE,ZESTORETIC) 10-12.5 MG per tablet Take 1 tablet by mouth every evening.    Yes Historical Provider, MD  omeprazole (PRILOSEC) 20 MG capsule Take 1 capsule (20 mg total) by mouth daily. 06/03/14  Yes Eliezer Bottom, NP  meclizine (ANTIVERT) 50 MG tablet Take 0.5 tablets (25 mg total) by mouth 3 (three) times daily as needed for dizziness or nausea. 01/03/15   Dorie Rank, MD  pseudoephedrine (SUDAFED 12 HOUR) 120 MG 12 hr tablet Take 1 tablet (120 mg total) by mouth every 12 (twelve) hours. 01/03/15   Dorie Rank, MD   BP 142/85 mmHg  Pulse 70  Temp(Src) 98.9 F (37.2 C) (Oral)  Resp 17  SpO2 98% Physical Exam  Constitutional: She appears well-developed and well-nourished. No distress.  HENT:  Head: Normocephalic and atraumatic.  Right Ear: External ear normal.  Left Ear: External ear normal.  Eyes: Conjunctivae are normal. Right eye exhibits no discharge. Left eye exhibits no discharge. No scleral icterus.  Neck: Neck supple. No tracheal deviation present.  Cardiovascular: Normal rate, regular rhythm and intact distal pulses.   Pulmonary/Chest: Effort normal and breath sounds normal. No stridor. No  respiratory distress. She has no wheezes. She has no rales.  Abdominal: Soft. Bowel sounds are normal. She exhibits no distension. There is no tenderness. There is no rebound and no guarding.  Musculoskeletal: She exhibits no edema or tenderness.  Neurological: She is alert. She has normal strength. No cranial nerve deficit (no facial droop, extraocular movements intact, no slurred speech) or sensory deficit. She exhibits normal muscle tone. She displays no seizure activity. Coordination normal.  Skin: Skin is warm and dry. No rash noted.  Psychiatric: She has a normal mood and affect.  Nursing note and vitals reviewed.   ED Course  Procedures (including critical care time) Labs Review Labs Reviewed  BASIC METABOLIC PANEL -  Abnormal; Notable for the following:    Calcium 8.7 (*)    All other components within normal limits  CBC WITH DIFFERENTIAL/PLATELET     MDM   Final diagnoses:  Peripheral vertigo, unspecified laterality    Pt's symptoms consistent with mild peripheral vertigo.  Normal neuro exam.  Labs normal.  Plan on dc home, sudafed and meclizine.    Dorie Rank, MD 01/03/15 1535

## 2015-01-11 ENCOUNTER — Telehealth: Payer: Self-pay

## 2015-01-11 NOTE — Telephone Encounter (Signed)
This is the third time calling and leaving a message on home phone. Mobile phone is not taking calls at present time. If does not return call in 3 days will send certified letter.

## 2015-01-22 ENCOUNTER — Emergency Department (HOSPITAL_COMMUNITY)
Admission: EM | Admit: 2015-01-22 | Discharge: 2015-01-22 | Disposition: A | Discharge status: Home / Self Care | Payer: Medicaid Other | Attending: Emergency Medicine | Admitting: Emergency Medicine

## 2015-01-22 ENCOUNTER — Encounter (HOSPITAL_COMMUNITY): Payer: Self-pay | Admitting: *Deleted

## 2015-01-22 DIAGNOSIS — K219 Gastro-esophageal reflux disease without esophagitis: Secondary | ICD-10-CM | POA: Insufficient documentation

## 2015-01-22 DIAGNOSIS — Z79899 Other long term (current) drug therapy: Secondary | ICD-10-CM | POA: Diagnosis not present

## 2015-01-22 DIAGNOSIS — R42 Dizziness and giddiness: Secondary | ICD-10-CM | POA: Diagnosis present

## 2015-01-22 DIAGNOSIS — Z853 Personal history of malignant neoplasm of breast: Secondary | ICD-10-CM | POA: Insufficient documentation

## 2015-01-22 DIAGNOSIS — I10 Essential (primary) hypertension: Secondary | ICD-10-CM | POA: Insufficient documentation

## 2015-01-22 DIAGNOSIS — E079 Disorder of thyroid, unspecified: Secondary | ICD-10-CM | POA: Diagnosis not present

## 2015-01-22 HISTORY — DX: Dizziness and giddiness: R42

## 2015-01-22 MED ORDER — MECLIZINE HCL 50 MG PO TABS: 50.0000 mg | ORAL_TABLET | Freq: Three times a day (TID) | ORAL | Status: DC | PRN

## 2015-01-22 MED ORDER — MECLIZINE HCL 25 MG PO TABS
25.0000 mg | ORAL_TABLET | Freq: Once | ORAL | Status: AC
  Administered 2015-01-22: 25 mg via ORAL
  Filled 2015-01-22: qty 1

## 2015-01-22 MED ORDER — OXYMETAZOLINE HCL 0.05 % NA SOLN
1.0000 | Freq: Two times a day (BID) | NASAL | Status: DC
  Administered 2015-01-22: 1 via NASAL
  Filled 2015-01-22: qty 15

## 2015-01-22 NOTE — ED Notes (Addendum)
Pt reports having nausea x 3 weeks. Had dizziness that occurs when she turns her head to the side. Also has facial sinus pressure and ear fullness. Denies headache, denies vomiting. Hx of vertigo.

## 2015-01-22 NOTE — ED Provider Notes (Signed)
CSN: [id number]     Arrival date & time 01/22/15  1214 History   First MD Initiated Contact with Patient 01/22/15 1431     Chief Complaint  Patient presents with  . Nausea  . Dizziness     (Consider location/radiation/quality/duration/timing/severity/associated sxs/prior Treatment) Patient is a 54 y.o. female presenting with dizziness. The history is provided by the patient.  Dizziness Quality:  Head spinning and vertigo Severity:  Moderate Onset quality:  Unable to specify Timing:  Intermittent Progression:  Worsening Chronicity:  New Context: head movement   Context: not with bowel movement, not with ear pain, not with eye movement, not with inactivity, not with loss of consciousness, not with medication, not when standing up and not when urinating   Relieved by:  Being still Worsened by:  Nothing Ineffective treatments:  None tried Associated symptoms: no blood in stool, no chest pain, no diarrhea, no headaches, no hearing loss, no nausea, no palpitations, no shortness of breath and no syncope   Risk factors: no anemia and no heart disease     Past Medical History  Diagnosis Date  . Thyroid disease   . Cancer     breast cancer  . Hypertension   . Acid reflux   . Vertigo    Past Surgical History  Procedure Laterality Date  . Abdominal hysterectomy    . Mastectomy    . Esophageal dilation     Family History  Problem Relation Age of Onset  . Asthma Mother   . Diabetes Father   . Breast cancer Paternal Aunt    Social History  Substance Use Topics  . Smoking status: Never Smoker   . Smokeless tobacco: Never Used     Comment: never used tobacco  . Alcohol Use: No   OB History    Gravida Para Term Preterm AB TAB SAB Ectopic Multiple Living   2 2 2       2      Review of Systems  HENT: Negative for hearing loss.   Respiratory: Negative for shortness of breath.   Cardiovascular: Negative for chest pain, palpitations and syncope.  Gastrointestinal: Negative  for nausea, diarrhea and blood in stool.  Neurological: Positive for dizziness. Negative for headaches.  All other systems reviewed and are negative.     Allergies  Aspirin  Home Medications   Prior to Admission medications   Medication Sig Start Date End Date Taking? Authorizing Provider  albuterol (PROVENTIL HFA;VENTOLIN HFA) 108 (90 BASE) MCG/ACT inhaler Inhale 2 puffs into the lungs every 4 (four) hours as needed for wheezing or shortness of breath. 12/15/14  Yes Volanda Napoleon, MD  levothyroxine (SYNTHROID, LEVOTHROID) 150 MCG tablet TAKE ONE TABLET BY MOUTH ONCE DAILY BEFORE BREAKFAST. 12/01/14  Yes Volanda Napoleon, MD  lisinopril-hydrochlorothiazide (PRINZIDE,ZESTORETIC) 10-12.5 MG per tablet Take 1 tablet by mouth daily.    Yes Historical Provider, MD  omeprazole (PRILOSEC) 20 MG capsule Take 1 capsule (20 mg total) by mouth daily. Patient taking differently: Take 20 mg by mouth daily as needed (heartburn or indigestion).  06/03/14  Yes [place], NP   BP 125/82 mmHg  Pulse 83  Temp(Src) 98.4 F (36.9 C) (Oral)  Resp 18  Ht 5\' 4"  (1.626 m)  Wt 217 lb (98.431 kg)  BMI 37.23 kg/m2  SpO2 97% Physical Exam  Constitutional: She is oriented to person, place, and time. She appears well-developed and well-nourished.  HENT:  Head: Normocephalic and atraumatic.  Right Ear: Tympanic membrane and  external ear normal.  Left Ear: Tympanic membrane and external ear normal.  Nose: Nose normal. Right sinus exhibits no maxillary sinus tenderness and no frontal sinus tenderness. Left sinus exhibits no maxillary sinus tenderness and no frontal sinus tenderness.  Eyes: Conjunctivae and EOM are normal. Pupils are equal, round, and reactive to light. Right eye exhibits no nystagmus. Left eye exhibits no nystagmus.  Neck: Normal range of motion. Neck supple.  Cardiovascular: Normal rate, regular rhythm, normal heart sounds and intact distal pulses.   Pulmonary/Chest: Effort normal and  breath sounds normal. No respiratory distress. She exhibits no tenderness.  S/p left mastectomy for breast ca  Abdominal: Soft. Bowel sounds are normal. She exhibits no distension and no mass. There is no tenderness.  Musculoskeletal: Normal range of motion. She exhibits no edema or tenderness.  Neurological: She is alert and oriented to person, place, and time. She has normal strength and normal reflexes. No sensory deficit. She displays a negative Romberg sign. GCS eye subscore is 4. GCS verbal subscore is 5. GCS motor subscore is 6.  Reflex Scores:      Tricep reflexes are 2+ on the right side and 2+ on the left side.      Bicep reflexes are 2+ on the right side and 2+ on the left side.      Brachioradialis reflexes are 2+ on the right side and 2+ on the left side.      Patellar reflexes are 2+ on the right side and 2+ on the left side.      Achilles reflexes are 2+ on the right side and 2+ on the left side. Patient with normal gait without ataxia, shuffling, spasm, or antalgia. Speech is normal without dysarthria, dysphasia, or aphasia. Muscle strength is 5/5 in bilateral shoulders, elbow flexor and extensors, wrist flexor and extensors, and intrinsic hand muscles. 5/5 bilateral lower extremity hip flexors, extensors, knee flexors and extensors, and ankle dorsi and plantar flexors.    Skin: Skin is warm and dry. No rash noted.  Psychiatric: She has a normal mood and affect. Her behavior is normal. Judgment and thought content normal.  Nursing note and vitals reviewed.   ED Course  Procedures (including critical care time) L MDM   Final diagnoses:  Vertigo    Patient asymptomatic on exam here except for some ttp over bilateral maxillary sinuses.  Plan nasal decongestant and antivert.  Patient advised regarding need for follow up and return precautions.    Pattricia Boss, MD 01/23/15 [id number]

## 2015-01-22 NOTE — Discharge Instructions (Signed)
Vertigo Vertigo means you feel like you are moving when you are not. Vertigo can make you feel like things around you are moving when they are not. This problem often goes away on its own.  HOME CARE   Follow your doctor's instructions.  Avoid driving.  Avoid using heavy machinery.  Avoid doing any activity that could be dangerous if you have a vertigo attack.  Tell your doctor if a medicine seems to cause your vertigo. GET HELP RIGHT AWAY IF:   Your medicines do not help or make you feel worse.  You have trouble talking or walking.  You feel weak or have trouble using your arms, hands, or legs.  You have bad headaches.  You keep feeling sick to your stomach (nauseous) or throwing up (vomiting).  Your vision changes.  A family member notices changes in your behavior.  Your problems get worse. MAKE SURE YOU:  Understand these instructions.  Will watch your condition.  Will get help right away if you are not doing well or get worse. Document Released: 03/06/2008 Document Revised: 08/20/2011 Document Reviewed: 12/14/2010 [employer] Patient Information 2015 [employer]. This information is not intended to replace advice given to you by your health care provider. Make sure you discuss any questions you have with your health care provider.  

## 2015-01-25 ENCOUNTER — Other Ambulatory Visit: Payer: Self-pay | Admitting: Hematology & Oncology

## 2015-02-07 ENCOUNTER — Emergency Department (HOSPITAL_COMMUNITY): Payer: Medicaid Other

## 2015-02-07 ENCOUNTER — Other Ambulatory Visit: Payer: Self-pay

## 2015-02-07 ENCOUNTER — Emergency Department (HOSPITAL_COMMUNITY)
Admission: EM | Admit: 2015-02-07 | Discharge: 2015-02-08 | Disposition: A | Discharge status: Home / Self Care | Payer: Medicaid Other | Attending: Emergency Medicine | Admitting: Emergency Medicine

## 2015-02-07 ENCOUNTER — Encounter (HOSPITAL_COMMUNITY): Payer: Self-pay | Admitting: Emergency Medicine

## 2015-02-07 DIAGNOSIS — K219 Gastro-esophageal reflux disease without esophagitis: Secondary | ICD-10-CM | POA: Insufficient documentation

## 2015-02-07 DIAGNOSIS — I10 Essential (primary) hypertension: Secondary | ICD-10-CM | POA: Insufficient documentation

## 2015-02-07 DIAGNOSIS — E079 Disorder of thyroid, unspecified: Secondary | ICD-10-CM | POA: Diagnosis not present

## 2015-02-07 DIAGNOSIS — Z853 Personal history of malignant neoplasm of breast: Secondary | ICD-10-CM | POA: Diagnosis not present

## 2015-02-07 DIAGNOSIS — R202 Paresthesia of skin: Secondary | ICD-10-CM | POA: Diagnosis not present

## 2015-02-07 DIAGNOSIS — R498 Other voice and resonance disorders: Secondary | ICD-10-CM | POA: Insufficient documentation

## 2015-02-07 DIAGNOSIS — Z79899 Other long term (current) drug therapy: Secondary | ICD-10-CM | POA: Insufficient documentation

## 2015-02-07 DIAGNOSIS — R200 Anesthesia of skin: Secondary | ICD-10-CM | POA: Diagnosis present

## 2015-02-07 LAB — CBC
HCT: 38.1 % (ref 36.0–46.0)
HEMOGLOBIN: 12.8 g/dL (ref 12.0–15.0)
MCH: 27.9 pg (ref 26.0–34.0)
MCHC: 33.6 g/dL (ref 30.0–36.0)
MCV: 83.2 fL (ref 78.0–100.0)
PLATELETS: 226 10*3/uL (ref 150–400)
RBC: 4.58 MIL/uL (ref 3.87–5.11)
RDW: 13.3 % (ref 11.5–15.5)
WBC: 6.3 10*3/uL (ref 4.0–10.5)

## 2015-02-07 LAB — BASIC METABOLIC PANEL
ANION GAP: 11 (ref 5–15)
BUN: 13 mg/dL (ref 6–20)
CALCIUM: 9.2 mg/dL (ref 8.9–10.3)
CO2: 22 mmol/L (ref 22–32)
Chloride: 105 mmol/L (ref 101–111)
Creatinine, Ser: 0.89 mg/dL (ref 0.44–1.00)
GFR calc Af Amer: >60 mL/min (ref >60)
Glucose, Bld: 102 mg/dL — ABNORMAL HIGH (ref 65–99)
Potassium: 3.4 mmol/L — ABNORMAL LOW (ref 3.5–5.1)
Sodium: 138 mmol/L (ref 135–145)

## 2015-02-07 LAB — I-STAT TROPONIN, ED: Troponin i, poc: 0 ng/mL (ref 0.00–0.08)

## 2015-02-07 LAB — CBG MONITORING, ED: GLUCOSE-CAPILLARY: 98 mg/dL (ref 65–99)

## 2015-02-07 NOTE — ED Provider Notes (Signed)
CSN: [id number]     Arrival date & time 02/07/15  2104 History   First MD Initiated Contact with Patient 02/07/15 2127     Chief Complaint  Patient presents with  . Numbness  . Tingling     The history is provided by the patient. No language interpreter was used.   [name] presents for evaluation of numbness.  Sxs started 1.5 hrs prior to ED arrival (around 7:45pm).  The started at rest, talking to friends.  She has tingling/numbness sensation throughout her arms and legs.  Sxs are waxing and waning in nature with no alleviating or worsening sxs.  She briefly had BLE weakness earlier in the day - now resolved.  She has some discomfort on her left lower teeth described as a nagging sensation.  She denies HA, fever, chest pain, sob, weakness, N/V/abdominal pain.  No hx/o similar previous sxs.  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  She also has a hx/o breast cancer in 95 s/p chemo and mastectomy, currently in remission.    Past Medical History  Diagnosis Date  . Thyroid disease   . Cancer     breast cancer  . Hypertension   . Acid reflux   . Vertigo    Past Surgical History  Procedure Laterality Date  . Abdominal hysterectomy    . Mastectomy    . Esophageal dilation     Family History  Problem Relation Age of Onset  . Asthma Mother   . Diabetes Father   . Breast cancer Paternal Aunt    Social History  Substance Use Topics  . Smoking status: Never Smoker   . Smokeless tobacco: Never Used     Comment: never used tobacco  . Alcohol Use: No   OB History    Gravida Para Term Preterm AB TAB SAB Ectopic Multiple Living   2 2 2       2      Review of Systems  All other systems reviewed and are negative.     Allergies  Aspirin  Home Medications   Prior to Admission medications   Medication Sig Start Date End Date Taking? Authorizing Provider  albuterol (PROVENTIL HFA;VENTOLIN HFA) 108 (90 BASE) MCG/ACT  inhaler Inhale 2 puffs into the lungs every 4 (four) hours as needed for wheezing or shortness of breath. 12/15/14  Yes Volanda Napoleon, MD  levothyroxine (SYNTHROID, LEVOTHROID) 150 MCG tablet TAKE ONE TABLET BY MOUTH ONCE DAILY BEFORE  BREAKFAST Patient taking differently: Take 1 tablet by mouth once daily before breakfasth. 01/25/15  Yes Volanda Napoleon, MD  lisinopril-hydrochlorothiazide (PRINZIDE,ZESTORETIC) 10-12.5 MG per tablet Take 1 tablet by mouth daily.    Yes Historical Provider, MD  meclizine (ANTIVERT) 50 MG tablet Take 1 tablet (50 mg total) by mouth 3 (three) times daily as needed. Patient taking differently: Take 50 mg by mouth 3 (three) times daily as needed for dizziness or nausea.  01/22/15  Yes Pattricia Boss, MD  omeprazole (PRILOSEC) 20 MG capsule Take 1 capsule (20 mg total) by mouth daily. Patient taking differently: Take 20 mg by mouth daily as needed (heartburn or indigestion).  06/03/14  Yes [place], NP   BP 127/75 mmHg  Pulse 67  Temp(Src) 98 F (36.7 C) (Oral)  Resp 14  SpO2 98% Physical Exam  Constitutional: She is oriented to person, place, and time. She appears well-developed and well-nourished.  HENT:  Head:  Normocephalic and atraumatic.  Cardiovascular: Normal rate and regular rhythm.   No murmur heard. Pulmonary/Chest: Effort normal and breath sounds normal. No respiratory distress.  Hoarse voice, no stridor  Abdominal: Soft. There is no tenderness. There is no rebound and no guarding.  Musculoskeletal: She exhibits no edema or tenderness.  Neurological: She is alert and oriented to person, place, and time. No cranial nerve deficit. Coordination normal.  Skin: Skin is warm and dry.  Psychiatric: She has a normal mood and affect. Her behavior is normal.  Nursing note and vitals reviewed.   ED Course  Procedures (including critical care time) Labs Review Labs Reviewed  BASIC METABOLIC PANEL - Abnormal; Notable for the following:    Potassium  3.4 (*)    Glucose, Bld 102 (*)    All other components within normal limits  URINALYSIS, ROUTINE W REFLEX MICROSCOPIC (NOT AT [facility]) - Abnormal; Notable for the following:    Hgb urine dipstick SMALL (*)    All other components within normal limits  CBC  URINE MICROSCOPIC-ADD ON  CBG MONITORING, ED  I-STAT TROPOININ, ED  I-STAT TROPOININ, ED    Imaging Review Ct Head Wo Contrast  02/07/2015   CLINICAL DATA:  Patient not feeling well, with tingling sensation about the entirety of the body, acute onset. Initial encounter.  EXAM: CT HEAD WITHOUT CONTRAST  TECHNIQUE: Contiguous axial images were obtained from the base of the skull through the vertex without intravenous contrast.  COMPARISON:  CT of the head performed 12/06/2012  FINDINGS: There is no evidence of acute infarction, mass lesion, or intra- or extra-axial hemorrhage on CT.  The posterior fossa, including the cerebellum, brainstem and fourth ventricle, is within normal limits. The third and lateral ventricles, and basal ganglia are unremarkable in appearance. The cerebral hemispheres are symmetric in appearance, with normal gray-white differentiation. No mass effect or midline shift is seen.  There is no evidence of fracture; visualized osseous structures are unremarkable in appearance. The orbits are within normal limits. The paranasal sinuses and mastoid air cells are well-aerated. No significant soft tissue abnormalities are seen.  IMPRESSION: Unremarkable noncontrast CT of the head.   Electronically Signed   By: Garald Balding M.D.   On: 02/07/2015 22:46   I have personally reviewed and evaluated these images and lab results as part of my medical decision-making.   EKG Interpretation None      MDM   Final diagnoses:  Paresthesia    Patient here for evaluation of paresthesias, no focal neuro deficits on examination and per paresthesias resolved in the emergency department without intervention. 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 History of presentation is not consistent with CVA, C-spine pathology. EKG performed in triage per protocol. EKG does have diffuse T-wave inversion, no recent EKGs available for comparison. Patient without any cardiac symptoms, no chest pain, shortness of breath, syncope. Discussed with patient cardiology follow-up for her EKG as well as PCP follow-up per paresthesias. Return precautions were discussed.    Quintella Reichert, MD 02/08/15 [phone]

## 2015-02-07 NOTE — ED Notes (Signed)
Dr. Ralene Bathe made aware of patient. Portable ekg performed in triage.

## 2015-02-07 NOTE — ED Notes (Addendum)
Pt states that she started feeling tingling and numbness all over her body 1 hour ago. Speech is clear. Acting appropriate. No one-sided weakness. Pt does report L sided sensory deficit on face. Alert and oriented.

## 2015-02-08 LAB — URINALYSIS, ROUTINE W REFLEX MICROSCOPIC
Bilirubin Urine: NEGATIVE
Glucose, UA: NEGATIVE mg/dL
Ketones, ur: NEGATIVE mg/dL
Leukocytes, UA: NEGATIVE
Nitrite: NEGATIVE
Protein, ur: NEGATIVE mg/dL
SPECIFIC GRAVITY, URINE: 1.013 (ref 1.005–1.030)
Urobilinogen, UA: 0.2 mg/dL (ref 0.0–1.0)
pH: 5 (ref 5.0–8.0)

## 2015-02-08 LAB — URINE MICROSCOPIC-ADD ON

## 2015-02-08 LAB — I-STAT TROPONIN, ED: Troponin i, poc: 0 ng/mL (ref 0.00–0.08)

## 2015-02-08 NOTE — Discharge Instructions (Signed)
The cause of your numbness was not identified today.  Please follow up with your family doctor for further evaluation.  Your EKG was not completely normal, please follow up with the Cardiologist.     Paresthesia Paresthesia is an abnormal burning or prickling sensation. This sensation is generally felt in the hands, arms, legs, or feet. However, it may occur in any part of the body. It is usually not painful. The feeling may be described as:  Tingling or numbness.  "Pins and needles."  Skin crawling.  Buzzing.  Limbs "falling asleep."  Itching. Most people experience temporary (transient) paresthesia at some time in their lives. CAUSES  Paresthesia may occur when you breathe too quickly (hyperventilation). It can also occur without any apparent cause. Commonly, paresthesia occurs when pressure is placed on a nerve. The feeling quickly goes away once the pressure is removed. For some people, however, paresthesia is a long-lasting (chronic) condition caused by an underlying disorder. The underlying disorder may be:  A traumatic, direct injury to nerves. Examples include a:  Broken (fractured) neck.  Fractured skull.  A disorder affecting the brain and spinal cord (central nervous system). Examples include:  Transverse myelitis.  Encephalitis.  Transient ischemic attack.  Multiple sclerosis.  Stroke.  Tumor or blood vessel problems, such as an arteriovenous malformation pressing against the brain or spinal cord.  A condition that damages the peripheral nerves (peripheral neuropathy). Peripheral nerves are not part of the brain and spinal cord. These conditions include:  Diabetes.  Peripheral vascular disease.  Nerve entrapment syndromes, such as carpal tunnel syndrome.  Shingles.  Hypothyroidism.  Vitamin B12 deficiencies.  Alcoholism.  Heavy metal poisoning (lead, arsenic).  Rheumatoid arthritis.  Systemic lupus erythematosus. DIAGNOSIS  Your caregiver  will attempt to find the underlying cause of your paresthesia. Your caregiver may:  Take your medical history.  Perform a physical exam.  Order various lab tests.  Order imaging tests. TREATMENT  Treatment for paresthesia depends on the underlying cause. HOME CARE INSTRUCTIONS  Avoid drinking alcohol.  You may consider massage or acupuncture to help relieve your symptoms.  Keep all follow-up appointments as directed by your caregiver. SEEK IMMEDIATE MEDICAL CARE IF:   You feel weak.  You have trouble walking or moving.  You have problems with speech or vision.  You feel confused.  You cannot control your bladder or bowel movements.  You feel numbness after an injury.  You faint.  Your burning or prickling feeling gets worse when walking.  You have pain, cramps, or dizziness.  You develop a rash. MAKE SURE YOU:  Understand these instructions.  Will watch your condition.  Will get help right away if you are not doing well or get worse. Document Released: 05/18/2002 Document Revised: 08/20/2011 Document Reviewed: 02/16/2011 [facility] Patient Information 2015 [place], [place]. This information is not intended to replace advice given to you by your health care provider. Make sure you discuss any questions you have with your health care provider.

## 2015-02-12 ENCOUNTER — Encounter (HOSPITAL_COMMUNITY): Payer: Self-pay | Admitting: Emergency Medicine

## 2015-02-12 ENCOUNTER — Emergency Department (HOSPITAL_COMMUNITY)
Admission: EM | Admit: 2015-02-12 | Discharge: 2015-02-12 | Disposition: A | Discharge status: Home / Self Care | Payer: Medicaid Other | Attending: Emergency Medicine | Admitting: Emergency Medicine

## 2015-02-12 DIAGNOSIS — K219 Gastro-esophageal reflux disease without esophagitis: Secondary | ICD-10-CM | POA: Diagnosis not present

## 2015-02-12 DIAGNOSIS — Z853 Personal history of malignant neoplasm of breast: Secondary | ICD-10-CM | POA: Diagnosis not present

## 2015-02-12 DIAGNOSIS — R1310 Dysphagia, unspecified: Secondary | ICD-10-CM

## 2015-02-12 DIAGNOSIS — Z79899 Other long term (current) drug therapy: Secondary | ICD-10-CM | POA: Diagnosis not present

## 2015-02-12 DIAGNOSIS — E079 Disorder of thyroid, unspecified: Secondary | ICD-10-CM | POA: Insufficient documentation

## 2015-02-12 DIAGNOSIS — I10 Essential (primary) hypertension: Secondary | ICD-10-CM | POA: Insufficient documentation

## 2015-02-12 DIAGNOSIS — R0989 Other specified symptoms and signs involving the circulatory and respiratory systems: Secondary | ICD-10-CM | POA: Diagnosis present

## 2015-02-12 MED ORDER — GLUCAGON HCL RDNA (DIAGNOSTIC) 1 MG IJ SOLR: 1.0000 mg | Freq: Once | INTRAMUSCULAR | Status: DC

## 2015-02-12 MED ORDER — STERILE WATER FOR INJECTION IJ SOLN
INTRAMUSCULAR | Status: AC
  Administered 2015-02-12: 10 mL
  Filled 2015-02-12: qty 10

## 2015-02-12 MED ORDER — LORAZEPAM 2 MG/ML IJ SOLN
1.0000 mg | Freq: Once | INTRAMUSCULAR | Status: AC
  Administered 2015-02-12: 1 mg via INTRAMUSCULAR
  Filled 2015-02-12: qty 1

## 2015-02-12 MED ORDER — LORAZEPAM 2 MG/ML IJ SOLN: 1.0000 mg | Freq: Once | INTRAMUSCULAR | Status: DC

## 2015-02-12 MED ORDER — GLUCAGON HCL RDNA (DIAGNOSTIC) 1 MG IJ SOLR
1.0000 mg | Freq: Once | INTRAMUSCULAR | Status: AC
  Administered 2015-02-12: 1 mg via INTRAMUSCULAR
  Filled 2015-02-12: qty 1

## 2015-02-12 NOTE — ED Provider Notes (Signed)
CSN: [id number]     Arrival date & time 02/12/15  1609 History   First MD Initiated Contact with Patient 02/12/15 1656     Chief Complaint  Patient presents with  . foreign object in throat     HPI  Ms. Greenfield is a 54 year old female presenting today with foreign object sensation in her throat. Pt states she was eating a grilled cheese on [place] toast yesterday and feels that she has a piece of food stuck on the left side of her throat. She is able to handle her secretions, drink fluids and eat solid foods. Pt has tried drinking and eating other soft foods to resolve the sensation with no success. Denies throat pain or painful swallowing. States that she has had this sensation in the past with multiple ED visits for it. Pt has a history of esophageal stricture "a long time ago" that required esophageal dilation. Denies fevers, headaches, chest pain, SOB, coughing, wheezing, abdominal pain, nausea or vomiting.   Past Medical History  Diagnosis Date  . Thyroid disease   . Cancer     breast cancer  . Hypertension   . Acid reflux   . Vertigo    Past Surgical History  Procedure Laterality Date  . Abdominal hysterectomy    . Mastectomy    . Esophageal dilation     Family History  Problem Relation Age of Onset  . Asthma Mother   . Diabetes Father   . Breast cancer Paternal Aunt    Social History  Substance Use Topics  . Smoking status: Never Smoker   . Smokeless tobacco: Never Used     Comment: never used tobacco  . Alcohol Use: No   OB History    Gravida Para Term Preterm AB TAB SAB Ectopic Multiple Living   2 2 2       2      Review of Systems  Constitutional: Negative for fever.  HENT: Positive for trouble swallowing (foreign body sensation). Negative for drooling, sore throat and voice change.   Respiratory: Negative for cough, choking, shortness of breath, wheezing and stridor.   Cardiovascular: Negative for chest pain.  Gastrointestinal: Negative for nausea, vomiting  and abdominal pain.  Musculoskeletal: Negative for neck pain.  Neurological: Negative for headaches.      Allergies  Aspirin  Home Medications   Prior to Admission medications   Medication Sig Start Date End Date Taking? Authorizing Provider  albuterol (PROVENTIL HFA;VENTOLIN HFA) 108 (90 BASE) MCG/ACT inhaler Inhale 2 puffs into the lungs every 4 (four) hours as needed for wheezing or shortness of breath. 12/15/14  Yes Volanda Napoleon, MD  levothyroxine (SYNTHROID, LEVOTHROID) 150 MCG tablet TAKE ONE TABLET BY MOUTH ONCE DAILY BEFORE  BREAKFAST Patient taking differently: Take 1 tablet by mouth once daily before breakfasth. 01/25/15  Yes Volanda Napoleon, MD  lisinopril-hydrochlorothiazide (PRINZIDE,ZESTORETIC) 10-12.5 MG per tablet Take 1 tablet by mouth daily.    Yes Historical Provider, MD  meclizine (ANTIVERT) 50 MG tablet Take 1 tablet (50 mg total) by mouth 3 (three) times daily as needed. Patient taking differently: Take 50 mg by mouth 3 (three) times daily as needed for dizziness or nausea.  01/22/15  Yes Pattricia Boss, MD  omeprazole (PRILOSEC) 20 MG capsule Take 1 capsule (20 mg total) by mouth daily. Patient taking differently: Take 20 mg by mouth daily as needed (heartburn or indigestion).  06/03/14  Yes [place], NP   BP 118/76 mmHg  Pulse  84  Temp(Src) 98.3 F (36.8 C) (Oral)  Resp 18  SpO2 100% Physical Exam  Constitutional: She is oriented to person, place, and time. She appears well-developed and well-nourished. No distress.  HENT:  Head: Normocephalic and atraumatic.  Eyes: Conjunctivae and EOM are normal.  Neck: Normal range of motion. Neck supple.  No TTP over neck. Pt able to rotate head and chin to chest with no pain. No audible stridor or wheezing. 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   Cardiovascular: Normal rate, regular rhythm and normal heart sounds.   Pulmonary/Chest: Effort normal and breath sounds  normal. No stridor. No respiratory distress. She has no wheezes.  Musculoskeletal: Normal range of motion.  Neurological: She is alert and oriented to person, place, and time.  Skin: Skin is warm and dry.  Psychiatric: She has a normal mood and affect. Her behavior is normal.  Nursing note and vitals reviewed.   ED Course  Procedures (including critical care time) Labs Review Labs Reviewed - No data to display  Imaging Review No results found. I have personally reviewed and evaluated these images and lab results as part of my medical decision-making.   EKG Interpretation None      MDM   Final diagnoses:  Dysphagia   Pt presenting with foreign object sensation in throat after eating a grilled cheese yesterday evening. Pt handling secretions and able to drink fluids and eat solids without issue. No painful swallowing, chest pain, SOB or other. Pt reports this has happened before and received glucagon in ED with symptom improvement. Given glucagon and ativan in ED. Pt reports improvement of symptoms. PO challenge successful. Pt will call her gastroenterologist to schedule a follow up appointment this week. Return precautions given in discharge paperwork and discussed with pt. Pt agrees with this plan.     Josephina Gip, PA-C 02/13/15 1147  Merrily Pew, MD 02/13/15 1149

## 2015-02-12 NOTE — ED Notes (Addendum)
Pt reported having a feeling of food in her throat from yesterday and feels like its still there. Pt denies n/v but stated that she could swallow a little bit.

## 2015-02-12 NOTE — ED Notes (Signed)
PA at bedside and pt given water for fld challenged without difficulty noted.

## 2015-02-12 NOTE — ED Notes (Signed)
Patient is ready to go home.

## 2015-02-12 NOTE — Discharge Instructions (Signed)
-   Return to ED with difficulty swallowing you saliva or liquids, difficulty breathing, change in voice or further worsening of symptoms - Call your GI doctor for follow up if sensation of foreign object persists

## 2015-02-12 NOTE — ED Notes (Signed)
Per pt, states she was eating a grilled cheese on [place] Toast yesterday-feels like a part of it is still in throat-able to drink fluids

## 2015-02-15 NOTE — Progress Notes (Signed)
Cardiology Office Note   Date:  02/16/2015   ID:  [name], DOB [birth year], MRN [id number]  PCP:  Gar Ponto, MD  Cardiologist:   Sharol Harness, MD   Chief Complaint  Patient presents with  . New Evaluation    Patient has felt light headed, dizzy, SOB, and has swelling in her left arm.      History of Present Illness: [name] is a 54 y.o. female  With hypertensin and breast cancer who presents for evaluation of an abnormal ECG.  [name] presented to the ED on 02/13/15 with the sensation of a foreign object in her throat.  ECG at that time showed sinus rhythm with inferior and anteriolateral T wave inversions and <47mm ST depression.  Troponin was negative x2.  Based on these findings she was referred to cardiology for evaluation.  She denies any chest pain, orthopnea, PND, or LE edema.  She does note exertional dyspnea that has been gradually increasing over time.   Past Medical History  Diagnosis Date  . Thyroid disease   . Cancer     breast cancer  . Hypertension   . Acid reflux   . Vertigo     Past Surgical History  Procedure Laterality Date  . Abdominal hysterectomy    . Mastectomy    . Esophageal dilation       Current Outpatient Prescriptions  Medication Sig Dispense Refill  . albuterol (PROVENTIL HFA;VENTOLIN HFA) 108 (90 BASE) MCG/ACT inhaler Inhale 2 puffs into the lungs every 4 (four) hours as needed for wheezing or shortness of breath. 1 Inhaler 3  . levothyroxine (SYNTHROID, LEVOTHROID) 150 MCG tablet TAKE ONE TABLET BY MOUTH ONCE DAILY BEFORE  BREAKFAST (Patient taking differently: Take 1 tablet by mouth once daily before breakfasth.) 30 tablet 0  . lisinopril-hydrochlorothiazide (PRINZIDE,ZESTORETIC) 10-12.5 MG per tablet Take 1 tablet by mouth daily.     . meclizine (ANTIVERT) 50 MG tablet Take 1 tablet (50 mg total) by mouth 3 (three) times daily as needed. (Patient taking differently: Take 50 mg by mouth 3 (three) times  daily as needed for dizziness or nausea. ) 30 tablet 0  . omeprazole (PRILOSEC) 20 MG capsule Take 1 capsule (20 mg total) by mouth daily. (Patient taking differently: Take 20 mg by mouth daily as needed (heartburn or indigestion). ) 30 capsule 6   No current facility-administered medications for this visit.    Allergies:   Aspirin    Social History:  The patient  reports that she has never smoked. She has never used smokeless tobacco. She reports that she does not drink alcohol or use illicit drugs.   Family History:  The patient's family history includes Asthma in her mother; Breast cancer in her paternal aunt; Diabetes in her father.    ROS:  Please see the history of present illness.   Otherwise, review of systems are positive for none.   All other systems are reviewed and negative.    PHYSICAL EXAM: VS:  BP 114/82 mmHg  Pulse 86  Ht 5\' 4"  (1.626 m)  Wt 93.441 kg (206 lb)  BMI 35.34 kg/m2 , BMI Body mass index is 35.34 kg/(m^2). GENERAL:  Well appearing HEENT:  Pupils equal round and reactive, fundi not visualized, oral mucosa unremarkable NECK:  No jugular venous distention, waveform within normal limits, carotid upstroke brisk and symmetric, no bruits, no thyromegaly LYMPHATICS:  No cervical adenopathy LUNGS:  Clear to auscultation bilaterally HEART:  RRR.  PMI not displaced or sustained,S1 and S2 within normal limits, no S3, no S4, no clicks, no rubs, no murmurs ABD:  Flat, positive bowel sounds normal in frequency in pitch, no bruits, no rebound, no guarding, no midline pulsatile mass, no hepatomegaly, no splenomegaly EXT:  2 plus pulses throughout, no edema, no cyanosis no clubbing SKIN:  No rashes no nodules NEURO:  Cranial nerves II through XII grossly intact, motor grossly intact throughout PSYCH:  Cognitively intact, oriented to person place and time    EKG:  EKG is ordered today. The ekg ordered today demonstrates sinus rhythm rate 86 bpm.  Inferior T wave  inversions concerning for ischemia.   Recent Labs: 12/15/2014: ALT 13 02/07/2015: BUN 13; Creatinine, Ser 0.89; Hemoglobin 12.8; Platelets 226; Potassium 3.4*; Sodium 138    Lipid Panel    Component Value Date/Time   CHOL 225* 05/21/2014 1023   TRIG 121 05/21/2014 1023   HDL 58 05/21/2014 1023   CHOLHDL 3.9 05/21/2014 1023   VLDL 24 05/21/2014 1023   LDLCALC 143* 05/21/2014 1023      Wt Readings from Last 3 Encounters:  02/16/15 93.441 kg (206 lb)  01/22/15 98.431 kg (217 lb)  12/15/14 98.431 kg (217 lb)      Other studies Reviewed: Additional studies/ records that were reviewed today include: . Review of the above records demonstrates:  Please see elsewhere in the note.    ASSESSMENT AND PLAN:  # Exertional dyspnea/ T wave inversions: [name]'s dyspnea is concerning for a possible anginal equivalent.  Given this finding and the T wave inversions on ECG, we will pursue an ischemia evaluation. - Exercise Myoview  # Hypertension: BP well-controlled.  Continue lisinopril-HCTZ.  # Obesity: [name] was encouraged to increase her physical activity and make at least one dietary change in an effort to lose weight and improve her cardiovascular heath  Current medicines are reviewed at length with the patient today.  The patient does not have concerns regarding medicines.  The following changes have been made:  no change  Labs/ tests ordered today include:   Orders Placed This Encounter  Procedures  . Myocardial Perfusion Imaging  . EKG 12-Lead     Disposition:   FU with Dr. Jonelle Sidle C. Oval Linsey prn   Signed, Sharol Harness, MD  02/16/2015 7:46 PM    [facility]

## 2015-02-16 ENCOUNTER — Encounter: Payer: Self-pay | Admitting: Cardiovascular Disease

## 2015-02-16 ENCOUNTER — Ambulatory Visit (INDEPENDENT_AMBULATORY_CARE_PROVIDER_SITE_OTHER): Payer: Medicaid Other | Admitting: Cardiovascular Disease

## 2015-02-16 VITALS — BP 114/82 | HR 86 | Ht 64.0 in | Wt 206.0 lb

## 2015-02-16 DIAGNOSIS — E669 Obesity, unspecified: Secondary | ICD-10-CM

## 2015-02-16 DIAGNOSIS — R0602 Shortness of breath: Secondary | ICD-10-CM

## 2015-02-16 DIAGNOSIS — J019 Acute sinusitis, unspecified: Secondary | ICD-10-CM

## 2015-02-16 DIAGNOSIS — R9431 Abnormal electrocardiogram [ECG] [EKG]: Secondary | ICD-10-CM

## 2015-02-16 DIAGNOSIS — I10 Essential (primary) hypertension: Secondary | ICD-10-CM

## 2015-02-16 HISTORY — DX: Essential (primary) hypertension: I10

## 2015-02-16 HISTORY — DX: Obesity, unspecified: E66.9

## 2015-02-16 NOTE — Patient Instructions (Addendum)
Your physician has requested that you have en exercise stress myoview. For further information please visit [email or web address]. Please follow instruction sheet, as given.  No changes with medication at the present time   Will contact you with results.  Your physician wants you to follow-up in 12 months with Dr Oval Linsey. You will receive a reminder letter in the mail two months in advance. If you don't receive a letter, please call our office to schedule the follow-up appointment.

## 2015-02-23 NOTE — Progress Notes (Signed)
Certified letter sent in response to no return calls from patient. Receipt returned signed and patient called office. Patient did say that did not want to continue Health Coaching at this time. Stated that wanted to see if doctor Ennever checked A1C. Doctor did not rechecked A1C. Will re-screen the first of 2017 if patient returns to [facility] and does not have insurance.

## 2015-03-01 ENCOUNTER — Other Ambulatory Visit: Payer: Self-pay | Admitting: *Deleted

## 2015-03-01 DIAGNOSIS — I10 Essential (primary) hypertension: Secondary | ICD-10-CM

## 2015-03-01 MED ORDER — LISINOPRIL-HYDROCHLOROTHIAZIDE 10-12.5 MG PO TABS: 1.0000 | ORAL_TABLET | Freq: Every day | ORAL | Status: AC

## 2015-03-03 ENCOUNTER — Ambulatory Visit: Payer: Self-pay | Admitting: Cardiovascular Disease

## 2015-03-04 ENCOUNTER — Inpatient Hospital Stay (HOSPITAL_COMMUNITY): Admission: RE | Admit: 2015-03-04 | Payer: Self-pay | Source: Ambulatory Visit

## 2015-03-04 ENCOUNTER — Telehealth (HOSPITAL_COMMUNITY): Payer: Self-pay

## 2015-03-04 NOTE — Telephone Encounter (Signed)
Encounter complete. 

## 2015-03-07 ENCOUNTER — Ambulatory Visit: Payer: Self-pay | Admitting: Cardiovascular Disease

## 2015-03-09 ENCOUNTER — Ambulatory Visit (HOSPITAL_COMMUNITY)
Admission: RE | Admit: 2015-03-09 | Discharge: 2015-03-09 | Disposition: A | Discharge status: Home / Self Care | Payer: Medicaid Other | Source: Ambulatory Visit | Attending: Cardiovascular Disease | Admitting: Cardiovascular Disease

## 2015-03-09 DIAGNOSIS — R9431 Abnormal electrocardiogram [ECG] [EKG]: Secondary | ICD-10-CM

## 2015-03-09 DIAGNOSIS — R0609 Other forms of dyspnea: Secondary | ICD-10-CM | POA: Diagnosis not present

## 2015-03-09 DIAGNOSIS — E079 Disorder of thyroid, unspecified: Secondary | ICD-10-CM | POA: Diagnosis not present

## 2015-03-09 DIAGNOSIS — J019 Acute sinusitis, unspecified: Secondary | ICD-10-CM

## 2015-03-09 DIAGNOSIS — Z6836 Body mass index (BMI) 36.0-36.9, adult: Secondary | ICD-10-CM | POA: Diagnosis not present

## 2015-03-09 DIAGNOSIS — E669 Obesity, unspecified: Secondary | ICD-10-CM | POA: Insufficient documentation

## 2015-03-09 DIAGNOSIS — R0602 Shortness of breath: Secondary | ICD-10-CM

## 2015-03-09 DIAGNOSIS — I10 Essential (primary) hypertension: Secondary | ICD-10-CM | POA: Diagnosis not present

## 2015-03-09 DIAGNOSIS — R42 Dizziness and giddiness: Secondary | ICD-10-CM | POA: Diagnosis not present

## 2015-03-09 LAB — MYOCARDIAL PERFUSION IMAGING
CHL CUP MPHR: 168 {beats}/min
CHL CUP NUCLEAR SRS: 1
CHL CUP NUCLEAR SSS: 1
CHL CUP STRESS STAGE 1 GRADE: 0 %
CHL CUP STRESS STAGE 2 HR: 113 {beats}/min
CHL CUP STRESS STAGE 4 GRADE: 10 %
CHL CUP STRESS STAGE 4 SPEED: 1.7 mph
CHL CUP STRESS STAGE 5 HR: 166 {beats}/min
CHL CUP STRESS STAGE 5 SPEED: 2.5 mph
CHL CUP STRESS STAGE 6 DBP: 77 mmHg
CHL CUP STRESS STAGE 7 SPEED: 0 mph
CSEPHR: 101 %
CSEPPHR: 166 {beats}/min
Estimated workload: 5.8 METS
Exercise duration (min): 4 min
LV dias vol: 95 mL
LVSYSVOL: 45 mL
Percent of predicted max HR: 99 %
RPE: 16
Rest HR: 78 {beats}/min
SDS: 0
Stage 1 HR: 78 {beats}/min
Stage 1 Speed: 0 mph
Stage 2 DBP: 93 mmHg
Stage 2 Grade: 0 %
Stage 2 SBP: 145 mmHg
Stage 2 Speed: 1 mph
Stage 3 Grade: 0 %
Stage 3 HR: 114 {beats}/min
Stage 3 Speed: 1 mph
Stage 4 HR: 153 {beats}/min
Stage 5 Grade: 12 %
Stage 6 Grade: 0 %
Stage 6 HR: 157 {beats}/min
Stage 6 SBP: 93 mmHg
Stage 6 Speed: 0 mph
Stage 7 DBP: 81 mmHg
Stage 7 Grade: 0 %
Stage 7 HR: 96 {beats}/min
Stage 7 SBP: 129 mmHg
TID: 1.09

## 2015-03-09 MED ORDER — TECHNETIUM TC 99M SESTAMIBI GENERIC - CARDIOLITE
31.4000 | Freq: Once | INTRAVENOUS | Status: AC | PRN
  Administered 2015-03-09: 31.4 via INTRAVENOUS

## 2015-03-09 MED ORDER — TECHNETIUM TC 99M SESTAMIBI GENERIC - CARDIOLITE
10.2000 | Freq: Once | INTRAVENOUS | Status: AC | PRN
Start: 2015-03-09 — End: 2015-03-09
  Administered 2015-03-09: 10.2 via INTRAVENOUS

## 2015-03-10 ENCOUNTER — Other Ambulatory Visit: Payer: Self-pay | Admitting: Hematology & Oncology

## 2015-03-10 ENCOUNTER — Telehealth: Payer: Self-pay | Admitting: *Deleted

## 2015-03-10 DIAGNOSIS — R0602 Shortness of breath: Secondary | ICD-10-CM

## 2015-03-10 NOTE — Telephone Encounter (Signed)
Spoke to patient. Result given . Verbalized understanding She is aware echo is need. Will order and contact after results are available

## 2015-03-10 NOTE — Telephone Encounter (Signed)
-----   Message from Skeet Latch, MD sent at 03/10/2015  9:30 AM EDT ----- Stress test shows that the blood flow in the heart is normal with exercise, but her heart may not be squeezing normally.  An echo is a better test to assess this.  Please order transthoracic echo for shortness of breath.

## 2015-03-21 ENCOUNTER — Other Ambulatory Visit: Payer: Self-pay

## 2015-03-21 ENCOUNTER — Ambulatory Visit (HOSPITAL_COMMUNITY): Payer: Medicaid Other | Attending: Internal Medicine

## 2015-03-21 DIAGNOSIS — R0600 Dyspnea, unspecified: Secondary | ICD-10-CM | POA: Diagnosis not present

## 2015-03-21 DIAGNOSIS — R0602 Shortness of breath: Secondary | ICD-10-CM

## 2015-03-24 ENCOUNTER — Telehealth: Payer: Self-pay | Admitting: *Deleted

## 2015-03-24 NOTE — Telephone Encounter (Signed)
Spoke to patient. Result given . Verbalized understanding  

## 2015-03-24 NOTE — Telephone Encounter (Signed)
-----   Message from Skeet Latch, MD sent at 03/23/2015  7:05 PM EDT ----- Echo shows that the heart is squeezing normally.  Her heart does not relax completely.  It will be important keep her blood pressure well-controlled.

## 2015-05-06 ENCOUNTER — Other Ambulatory Visit: Payer: Self-pay | Admitting: Hematology & Oncology

## 2015-05-09 ENCOUNTER — Other Ambulatory Visit: Payer: Self-pay | Admitting: Obstetrics and Gynecology

## 2015-05-09 DIAGNOSIS — Z1231 Encounter for screening mammogram for malignant neoplasm of breast: Secondary | ICD-10-CM

## 2015-05-18 ENCOUNTER — Encounter (HOSPITAL_COMMUNITY): Payer: Self-pay | Admitting: Emergency Medicine

## 2015-05-18 ENCOUNTER — Emergency Department (HOSPITAL_COMMUNITY)
Admission: EM | Admit: 2015-05-18 | Discharge: 2015-05-18 | Disposition: A | Discharge status: Home / Self Care | Payer: Medicaid Other | Attending: Emergency Medicine | Admitting: Emergency Medicine

## 2015-05-18 DIAGNOSIS — E079 Disorder of thyroid, unspecified: Secondary | ICD-10-CM | POA: Diagnosis not present

## 2015-05-18 DIAGNOSIS — R1310 Dysphagia, unspecified: Secondary | ICD-10-CM | POA: Insufficient documentation

## 2015-05-18 DIAGNOSIS — Z79899 Other long term (current) drug therapy: Secondary | ICD-10-CM | POA: Insufficient documentation

## 2015-05-18 DIAGNOSIS — E669 Obesity, unspecified: Secondary | ICD-10-CM | POA: Diagnosis not present

## 2015-05-18 DIAGNOSIS — Z9889 Other specified postprocedural states: Secondary | ICD-10-CM | POA: Diagnosis not present

## 2015-05-18 DIAGNOSIS — K219 Gastro-esophageal reflux disease without esophagitis: Secondary | ICD-10-CM | POA: Insufficient documentation

## 2015-05-18 DIAGNOSIS — Z853 Personal history of malignant neoplasm of breast: Secondary | ICD-10-CM | POA: Diagnosis not present

## 2015-05-18 DIAGNOSIS — I10 Essential (primary) hypertension: Secondary | ICD-10-CM | POA: Insufficient documentation

## 2015-05-18 DIAGNOSIS — R498 Other voice and resonance disorders: Secondary | ICD-10-CM | POA: Diagnosis present

## 2015-05-18 MED ORDER — GLUCAGON HCL RDNA (DIAGNOSTIC) 1 MG IJ SOLR
1.0000 mg | Freq: Once | INTRAMUSCULAR | Status: AC
  Administered 2015-05-18: 1 mg via INTRAMUSCULAR
  Filled 2015-05-18: qty 1

## 2015-05-18 NOTE — ED Notes (Signed)
Patient observed by this RN for 20 minutes per order.  Patient states that feels the bolus has been relieved.  Breathing even and unlabored, NAD.

## 2015-05-18 NOTE — Discharge Instructions (Signed)

## 2015-05-18 NOTE — ED Notes (Signed)
Patient c/o "something stuck in her throat".  Denies pain, states that intermittently something is "sticking me". Patient denies SOB or difficulty breathing.  Patient is talking without difficulty but has a "raspyness" in her voice that she states is normal due to vocal cord surgery in 1997.  Breathing even and unlabored.  NAD at this time.

## 2015-05-18 NOTE — ED Notes (Signed)
MD at bedside. 

## 2015-05-18 NOTE — ED Notes (Signed)
Pt states that she feels like something she ate yesterday is still stuck in her throat.  Pt states that she has had this problem before and had to come in here and get medicine that relaxed her muscles in her throat.  Pt is hoarse when she talked but says it's her baseline. Pt states that she can swallow PO liquids but hasnt tried eating any solids this am.

## 2015-05-18 NOTE — ED Notes (Signed)
Patient d/c'd self care.  F/U discussed, patient verbalized understanding.

## 2015-05-18 NOTE — ED Provider Notes (Signed)
CSN: [id number]     Arrival date & time 05/18/15  [id number] History   First MD Initiated Contact with Patient 05/18/15 1102     Chief Complaint  Patient presents with  . feels like something stuck in throat       HPI  Patient presents for evaluation of a sensation of "something stuck in my throat". Describes a "hoarse voice" since chest and neck surgery. 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 States her left vocal cord "moves", but doesn't move as well as the right presents that she will occasionally feel a sensation of something in her throat after eating. She ate peanuts in a regular diet and meals yesterday. It still feels like there is something stuck. Is able swallow water. Has not taken by mouth since. Has responded well to glucagon in the past.  Past Medical History  Diagnosis Date  . Thyroid disease   . Cancer [facility])     breast cancer  . Hypertension   . Acid reflux   . Vertigo   . Essential hypertension 02/16/2015  . Obesity 02/16/2015   Past Surgical History  Procedure Laterality Date  . Abdominal hysterectomy    . Mastectomy    . Esophageal dilation     Family History  Problem Relation Age of Onset  . Asthma Mother   . Diabetes Father   . Breast cancer Paternal Aunt    Social History  Substance Use Topics  . Smoking status: Never Smoker   . Smokeless tobacco: Never Used     Comment: never used tobacco  . Alcohol Use: No   OB History    Gravida Para Term Preterm AB TAB SAB Ectopic Multiple Living   2 2 2       2      Review of Systems  Constitutional: Negative for fever, chills, diaphoresis, appetite change and fatigue.  HENT: Positive for trouble swallowing. Negative for mouth sores and sore throat.   Eyes: Negative for visual disturbance.  Respiratory: Negative for cough, chest tightness, shortness of breath and wheezing.   Cardiovascular: Negative for chest pain.  Gastrointestinal: Negative for nausea, vomiting, abdominal pain,  diarrhea and abdominal distention.  Endocrine: Negative for polydipsia, polyphagia and polyuria.  Genitourinary: Negative for dysuria, frequency and hematuria.  Musculoskeletal: Negative for gait problem.  Skin: Negative for color change, pallor and rash.  Neurological: Negative for dizziness, syncope, light-headedness and headaches.  Hematological: Does not bruise/bleed easily.  Psychiatric/Behavioral: Negative for behavioral problems and confusion.      Allergies  Aspirin  Home Medications   Prior to Admission medications   Medication Sig Start Date End Date Taking? Authorizing Provider  albuterol (PROVENTIL HFA;VENTOLIN HFA) 108 (90 BASE) MCG/ACT inhaler Inhale 2 puffs into the lungs every 4 (four) hours as needed for wheezing or shortness of breath. 12/15/14  Yes Volanda Napoleon, MD  levothyroxine (SYNTHROID, LEVOTHROID) 150 MCG tablet TAKE ONE TABLET BY MOUTH ONCE DAILY BEFORE  BREAKFAST 05/06/15  Yes Volanda Napoleon, MD  lisinopril-hydrochlorothiazide (PRINZIDE,ZESTORETIC) 10-12.5 MG per tablet Take 1 tablet by mouth daily. 03/01/15  Yes Volanda Napoleon, MD  meclizine (ANTIVERT) 50 MG tablet Take 1 tablet (50 mg total) by mouth 3 (three) times daily as needed. Patient taking differently: Take 50 mg by mouth 3 (three) times daily as needed for dizziness or nausea.  01/22/15  Yes Pattricia Boss, MD  omeprazole (PRILOSEC) 20 MG capsule Take 1 capsule (20 mg total) by mouth daily. Patient  taking differently: Take 20 mg by mouth daily as needed (heartburn or indigestion).  06/03/14  Yes Eliezer Bottom, NP   BP 112/73 mmHg  Pulse 75  Temp(Src) 98 F (36.7 C) (Oral)  Resp 20  SpO2 100% Physical Exam  Constitutional: She is oriented to person, place, and time. She appears well-developed and well-nourished. No distress.  HENT:  Head: Normocephalic.  Eyes: Conjunctivae are normal. Pupils are equal, round, and reactive to light. No scleral icterus.  Neck: Normal range of motion. Neck  supple. No thyromegaly present.  Trachea midline. Hoarse voice. Not drooling or stridorous. Normal posterior pharynx. Clear lungs.  Cardiovascular: Normal rate and regular rhythm.  Exam reveals no gallop and no friction rub.   No murmur heard. Pulmonary/Chest: Effort normal and breath sounds normal. No respiratory distress. She has no wheezes. She has no rales.  Abdominal: Soft. Bowel sounds are normal. She exhibits no distension. There is no tenderness. There is no rebound.  Musculoskeletal: Normal range of motion.  Neurological: She is alert and oriented to person, place, and time.  Skin: Skin is warm and dry. No rash noted.  Psychiatric: She has a normal mood and affect. Her behavior is normal.    ED Course  Procedures (including critical care time) Labs Review Labs Reviewed - No data to display  Imaging Review No results found. I have personally reviewed and evaluated these images and lab results as part of my medical decision-making.   EKG Interpretation None      MDM   Final diagnoses:  Dysphagia    She given IM glucagon. Is insistent that she cannot wait for any type of response because she "has to go pick up her kids". Was observed for 20 minutes following injection. Plan will be discharge home. Return if worsening. Continue her primary care follow-up.    Tanna Furry, MD 05/18/15 [phone]

## 2015-05-19 ENCOUNTER — Ambulatory Visit (HOSPITAL_COMMUNITY)
Admission: RE | Admit: 2015-05-19 | Discharge: 2015-05-19 | Disposition: A | Discharge status: Home / Self Care | Payer: Self-pay | Source: Ambulatory Visit | Attending: Obstetrics and Gynecology | Admitting: Obstetrics and Gynecology

## 2015-05-19 ENCOUNTER — Encounter (HOSPITAL_COMMUNITY): Payer: Self-pay

## 2015-05-19 VITALS — BP 110/72 | Temp 98.1°F | Ht 64.0 in | Wt 205.0 lb

## 2015-05-19 DIAGNOSIS — Z1239 Encounter for other screening for malignant neoplasm of breast: Secondary | ICD-10-CM

## 2015-05-19 NOTE — Progress Notes (Signed)
No complaints today.  Pap Smear: Pap smear not completed today. Last Pap smear was in 2007 and normal per patient. Per patient has no history of an abnormal Pap smear. Patient has a history of a hysterectomy for fibroids in May 2007. Patient no longer needs Pap smears due to her history of a hysterectomy for benign reasons per BCCCP and ACOG guidelines. No Pap smear results in EPIC.  Physical exam: Breasts Patient has a history of a left breast mastectomy for breast cancer in 1995. No skin abnormalities bilateral breasts. No nipple retraction right breast. No nipple discharge right breast. No lymphadenopathy. No lumps palpated bilateral breasts. No complaints of pain or tenderness on exam. Referred patient to the [facility] for a screening mammogram. Appointment scheduled for Tuesday, May 24, 2015 at 1000.   Pelvic/Bimanual No Pap smear completed today since patient has a history of a hysterectomy for benign reasons Pap smear not indicated per BCCCP guidelines.

## 2015-05-19 NOTE — Patient Instructions (Signed)
Educational materials on self breat awareness given. Explained to [name] that she did not need a Pap smear today due to a history of a hysterectomy for benign reasons. Let patient know that she will not need any further Pap smears due to her history of a hysterectomy for benign reasons. Referred patient to the [facility] for a screening mammogram. Appointment scheduled for Tuesday, May 24, 2015 at 1000. Patient aware of appointment and will be there. [name] verbalized understanding.  Dayton Sherr, Arvil Chaco, RN 8:29 AM

## 2015-05-24 ENCOUNTER — Ambulatory Visit
Admission: RE | Admit: 2015-05-24 | Discharge: 2015-05-24 | Disposition: A | Discharge status: Home / Self Care | Payer: No Typology Code available for payment source | Source: Ambulatory Visit | Attending: Obstetrics and Gynecology | Admitting: Obstetrics and Gynecology

## 2015-05-24 ENCOUNTER — Other Ambulatory Visit: Payer: Self-pay | Admitting: Obstetrics and Gynecology

## 2015-05-24 DIAGNOSIS — Z1231 Encounter for screening mammogram for malignant neoplasm of breast: Secondary | ICD-10-CM

## 2015-05-31 ENCOUNTER — Telehealth: Payer: Self-pay | Admitting: *Deleted

## 2015-05-31 NOTE — Telephone Encounter (Signed)
Medical Records from March 2010 to present faxed to   [facility]  Per faxed request including patient release. Will scan release into chart.

## 2015-06-08 ENCOUNTER — Encounter: Payer: Self-pay | Admitting: Hematology & Oncology

## 2015-06-15 ENCOUNTER — Other Ambulatory Visit: Payer: No Typology Code available for payment source

## 2015-06-15 ENCOUNTER — Ambulatory Visit: Payer: Self-pay | Admitting: Hematology & Oncology

## 2015-06-17 ENCOUNTER — Telehealth: Payer: Self-pay

## 2015-06-17 NOTE — Telephone Encounter (Signed)
Called to re screen in [facility] program for A1C and Lipid profile. Patient has not had either re checked since last year. Left message for patient to return call. Will try again if do not get a return call.

## 2015-06-24 ENCOUNTER — Other Ambulatory Visit: Payer: Self-pay | Admitting: Hematology & Oncology

## 2015-06-25 ENCOUNTER — Other Ambulatory Visit: Payer: Self-pay | Admitting: Hematology & Oncology

## 2015-07-04 ENCOUNTER — Ambulatory Visit: Payer: Self-pay | Admitting: Hematology & Oncology

## 2015-07-04 ENCOUNTER — Other Ambulatory Visit: Payer: No Typology Code available for payment source

## 2015-08-05 ENCOUNTER — Other Ambulatory Visit: Payer: No Typology Code available for payment source

## 2015-08-05 ENCOUNTER — Ambulatory Visit: Payer: No Typology Code available for payment source

## 2015-08-28 ENCOUNTER — Emergency Department (HOSPITAL_COMMUNITY)
Admission: EM | Admit: 2015-08-28 | Discharge: 2015-08-28 | Disposition: A | Discharge status: Home / Self Care | Payer: Medicaid Other | Attending: Emergency Medicine | Admitting: Emergency Medicine

## 2015-08-28 ENCOUNTER — Emergency Department (HOSPITAL_COMMUNITY): Payer: Medicaid Other

## 2015-08-28 ENCOUNTER — Encounter (HOSPITAL_COMMUNITY): Payer: Self-pay | Admitting: *Deleted

## 2015-08-28 DIAGNOSIS — R05 Cough: Secondary | ICD-10-CM | POA: Diagnosis present

## 2015-08-28 DIAGNOSIS — Z79899 Other long term (current) drug therapy: Secondary | ICD-10-CM | POA: Diagnosis not present

## 2015-08-28 DIAGNOSIS — I10 Essential (primary) hypertension: Secondary | ICD-10-CM | POA: Insufficient documentation

## 2015-08-28 DIAGNOSIS — Z853 Personal history of malignant neoplasm of breast: Secondary | ICD-10-CM | POA: Diagnosis not present

## 2015-08-28 DIAGNOSIS — E039 Hypothyroidism, unspecified: Secondary | ICD-10-CM | POA: Insufficient documentation

## 2015-08-28 DIAGNOSIS — E669 Obesity, unspecified: Secondary | ICD-10-CM | POA: Insufficient documentation

## 2015-08-28 DIAGNOSIS — K219 Gastro-esophageal reflux disease without esophagitis: Secondary | ICD-10-CM | POA: Diagnosis not present

## 2015-08-28 DIAGNOSIS — J069 Acute upper respiratory infection, unspecified: Secondary | ICD-10-CM

## 2015-08-28 LAB — I-STAT CHEM 8, ED
BUN: 16 mg/dL (ref 6–20)
CALCIUM ION: 1 mmol/L — AB (ref 1.12–1.23)
Chloride: 101 mmol/L (ref 101–111)
Creatinine, Ser: 0.9 mg/dL (ref 0.44–1.00)
GLUCOSE: 102 mg/dL — AB (ref 65–99)
HCT: 44 % (ref 36.0–46.0)
HEMOGLOBIN: 15 g/dL (ref 12.0–15.0)
POTASSIUM: 5.8 mmol/L — AB (ref 3.5–5.1)
Sodium: 136 mmol/L (ref 135–145)
TCO2: 28 mmol/L (ref 0–100)

## 2015-08-28 LAB — BASIC METABOLIC PANEL
ANION GAP: 10 (ref 5–15)
BUN: 12 mg/dL (ref 6–20)
CALCIUM: 8.8 mg/dL — AB (ref 8.9–10.3)
CO2: 25 mmol/L (ref 22–32)
Chloride: 104 mmol/L (ref 101–111)
Creatinine, Ser: 0.88 mg/dL (ref 0.44–1.00)
Glucose, Bld: 101 mg/dL — ABNORMAL HIGH (ref 65–99)
Potassium: 3.7 mmol/L (ref 3.5–5.1)
SODIUM: 139 mmol/L (ref 135–145)

## 2015-08-28 MED ORDER — IBUPROFEN 800 MG PO TABS: 800.0000 mg | ORAL_TABLET | Freq: Three times a day (TID) | ORAL | Status: DC

## 2015-08-28 MED ORDER — SODIUM CHLORIDE 0.9 % IV BOLUS (SEPSIS)
1000.0000 mL | Freq: Once | INTRAVENOUS | Status: AC
  Administered 2015-08-28: 1000 mL via INTRAVENOUS

## 2015-08-28 MED ORDER — BENZONATATE 100 MG PO CAPS: 100.0000 mg | ORAL_CAPSULE | Freq: Three times a day (TID) | ORAL | Status: DC

## 2015-08-28 MED ORDER — IBUPROFEN 800 MG PO TABS
800.0000 mg | ORAL_TABLET | Freq: Once | ORAL | Status: AC
  Administered 2015-08-28: 800 mg via ORAL
  Filled 2015-08-28: qty 1

## 2015-08-28 MED ORDER — BENZONATATE 100 MG PO CAPS
100.0000 mg | ORAL_CAPSULE | Freq: Once | ORAL | Status: AC
  Administered 2015-08-28: 100 mg via ORAL
  Filled 2015-08-28: qty 1

## 2015-08-28 MED ORDER — SODIUM CHLORIDE 0.9 % IV BOLUS (SEPSIS)
500.0000 mL | Freq: Once | INTRAVENOUS | Status: AC
  Administered 2015-08-28: 500 mL via INTRAVENOUS

## 2015-08-28 NOTE — Discharge Instructions (Signed)
1. Hold off on taking your lisinopril.   2. Start taking ibuprofen and tessalon pereles. 3.  Follow up with your PCP in 2-3 days for re-evaluation of your blood pressure.  Drink plenty of fluids.    Upper Respiratory Infection, Adult Most upper respiratory infections (URIs) are a viral infection of the air passages leading to the lungs. A URI affects the nose, throat, and upper air passages. The most common type of URI is nasopharyngitis and is typically referred to as "the common cold." URIs run their course and usually go away on their own. Most of the time, a URI does not require medical attention, but sometimes a bacterial infection in the upper airways can follow a viral infection. This is called a secondary infection. Sinus and middle ear infections are common types of secondary upper respiratory infections. Bacterial pneumonia can also complicate a URI. A URI can worsen asthma and chronic obstructive pulmonary disease (COPD). Sometimes, these complications can require emergency medical care and may be life threatening.  CAUSES Almost all URIs are caused by viruses. A virus is a type of germ and can spread from one person to another.  RISKS FACTORS You may be at risk for a URI if:   You smoke.   You have chronic heart or lung disease.  You have a weakened defense (immune) system.   You are very young or very old.   You have nasal allergies or asthma.  You work in crowded or poorly ventilated areas.  You work in health care facilities or schools. SIGNS AND SYMPTOMS  Symptoms typically develop 2-3 days after you come in contact with a cold virus. Most viral URIs last 7-10 days. However, viral URIs from the influenza virus (flu virus) can last 14-18 days and are typically more severe. Symptoms may include:   Runny or stuffy (congested) nose.   Sneezing.   Cough.   Sore throat.   Headache.   Fatigue.   Fever.   Loss of appetite.   Pain in your forehead,  behind your eyes, and over your cheekbones (sinus pain).  Muscle aches.  DIAGNOSIS  Your health care provider may diagnose a URI by:  Physical exam.  Tests to check that your symptoms are not due to another condition such as:  Strep throat.  Sinusitis.  Pneumonia.  Asthma. TREATMENT  A URI goes away on its own with time. It cannot be cured with medicines, but medicines may be prescribed or recommended to relieve symptoms. Medicines may help:  Reduce your fever.  Reduce your cough.  Relieve nasal congestion. HOME CARE INSTRUCTIONS   Take medicines only as directed by your health care provider.   Gargle warm saltwater or take cough drops to comfort your throat as directed by your health care provider.  Use a warm mist humidifier or inhale steam from a shower to increase air moisture. This may make it easier to breathe.  Drink enough fluid to keep your urine clear or pale yellow.   Eat soups and other clear broths and maintain good nutrition.   Rest as needed.   Return to work when your temperature has returned to normal or as your health care provider advises. You may need to stay home longer to avoid infecting others. You can also use a face mask and careful hand washing to prevent spread of the virus.  Increase the usage of your inhaler if you have asthma.   Do not use any tobacco products, including cigarettes, chewing tobacco, or  electronic cigarettes. If you need help quitting, ask your health care provider. PREVENTION  The best way to protect yourself from getting a cold is to practice good hygiene.   Avoid oral or hand contact with people with cold symptoms.   Wash your hands often if contact occurs.  There is no clear evidence that vitamin C, vitamin E, echinacea, or exercise reduces the chance of developing a cold. However, it is always recommended to get plenty of rest, exercise, and practice good nutrition.  SEEK MEDICAL CARE IF:   You are getting  worse rather than better.   Your symptoms are not controlled by medicine.   You have chills.  You have worsening shortness of breath.  You have brown or red mucus.  You have yellow or brown nasal discharge.  You have pain in your face, especially when you bend forward.  You have a fever.  You have swollen neck glands.  You have pain while swallowing.  You have white areas in the back of your throat. SEEK IMMEDIATE MEDICAL CARE IF:   You have severe or persistent:  Headache.  Ear pain.  Sinus pain.  Chest pain.  You have chronic lung disease and any of the following:  Wheezing.  Prolonged cough.  Coughing up blood.  A change in your usual mucus.  You have a stiff neck.  You have changes in your:  Vision.  Hearing.  Thinking.  Mood. MAKE SURE YOU:   Understand these instructions.  Will watch your condition.  Will get help right away if you are not doing well or get worse.   This information is not intended to replace advice given to you by your health care provider. Make sure you discuss any questions you have with your health care provider.   Document Released: 11/21/2000 Document Revised: 10/12/2014 Document Reviewed: 09/02/2013 Elsevier Interactive Patient Education [employer].

## 2015-08-28 NOTE — ED Provider Notes (Signed)
CSN: [id number]     Arrival date & time 08/28/15  1014 History   First MD Initiated Contact with Patient 08/28/15 1123     Chief Complaint  Patient presents with  . URI     (Consider location/radiation/quality/duration/timing/severity/associated sxs/prior Treatment) HPI   [name] is a 55 y.o. female with PMH significant for thyroid disease, HTN, acid reflux, obesity, breast cancer in remission who presents with 3 day history of sudden onset, constant, worsening productive cough (mucous), bilateral ear pain, generalized headache, hemoptysis (occassional streaks in sputum intermittently), and nausea.  Denies sore throat, vomiting, rhinorrhea, CP, SOB, fever, visual disturbance, unilateral leg swelling, recent surgery/immobilization/travel, or hx of DVT/PE.  She does not smoke.  She reports drinking plenty of fluids and normal PO intake.  She has been taking Tylenol for her symptoms with minimal relief.     Past Medical History  Diagnosis Date  . Thyroid disease   . Hypertension   . Acid reflux   . Vertigo   . Essential hypertension 02/16/2015  . Obesity 02/16/2015  . Cancer [facility])     breast cancer   Past Surgical History  Procedure Laterality Date  . Abdominal hysterectomy    . Mastectomy    . Esophageal dilation     Family History  Problem Relation Age of Onset  . Asthma Mother   . Diabetes Father   . Breast cancer Paternal Aunt    Social History  Substance Use Topics  . Smoking status: Never Smoker   . Smokeless tobacco: Never Used     Comment: never used tobacco  . Alcohol Use: No   OB History    Gravida Para Term Preterm AB TAB SAB Ectopic Multiple Living   2 2 2       2      Review of Systems All other systems negative unless otherwise stated in HPI    Allergies  Aspirin  Home Medications   Prior to Admission medications   Medication Sig Start Date End Date Taking? Authorizing Provider  albuterol (PROVENTIL HFA;VENTOLIN HFA) 108 (90 BASE) MCG/ACT  inhaler Inhale 2 puffs into the lungs every 4 (four) hours as needed for wheezing or shortness of breath. 12/15/14  Yes Volanda Napoleon, MD  levothyroxine (SYNTHROID, LEVOTHROID) 150 MCG tablet TAKE ONE TABLET BY MOUTH ONCE DAILY BEFORE BREAKFAST 06/27/15  Yes Volanda Napoleon, MD  lisinopril-hydrochlorothiazide (PRINZIDE,ZESTORETIC) 10-12.5 MG per tablet Take 1 tablet by mouth daily. 03/01/15  Yes Volanda Napoleon, MD  meclizine (ANTIVERT) 50 MG tablet Take 1 tablet (50 mg total) by mouth 3 (three) times daily as needed. Patient taking differently: Take 50 mg by mouth 3 (three) times daily as needed for dizziness.  01/22/15  Yes Pattricia Boss, MD  omeprazole (PRILOSEC) 20 MG capsule Take 1 capsule (20 mg total) by mouth daily. Patient taking differently: Take 20 mg by mouth daily as needed (heartburn or indigestion).  06/03/14  Yes Eliezer Bottom, NP  benzonatate (TESSALON) 100 MG capsule Take 1 capsule (100 mg total) by mouth every 8 (eight) hours. 08/28/15   Gloriann Loan, PA-C  ibuprofen (ADVIL,MOTRIN) 800 MG tablet Take 1 tablet (800 mg total) by mouth 3 (three) times daily. 08/28/15   Gloriann Loan, PA-C  levothyroxine (SYNTHROID, LEVOTHROID) 150 MCG tablet TAKE ONE TABLET BY MOUTH ONCE DAILY BEFORE BREAKFAST 06/24/15   Volanda Napoleon, MD   BP 107/68 mmHg  Pulse 83  Temp(Src) 98.9 F (37.2 C) (Oral)  Resp 23  SpO2  99% Physical Exam  Constitutional: She is oriented to person, place, and time. She appears well-developed and well-nourished.  Non-toxic appearance. She does not have a sickly appearance. She does not appear ill.  HENT:  Head: Normocephalic and atraumatic.  Right Ear: Tympanic membrane and external ear normal.  Left Ear: Tympanic membrane and external ear normal.  Nose: Nose normal. Right sinus exhibits no maxillary sinus tenderness and no frontal sinus tenderness. Left sinus exhibits no maxillary sinus tenderness and no frontal sinus tenderness.  Mouth/Throat: Uvula is midline,  oropharynx is clear and moist and mucous membranes are normal. No oropharyngeal exudate, posterior oropharyngeal edema or posterior oropharyngeal erythema.  Eyes: Conjunctivae are normal.  Neck: Normal range of motion. Neck supple.  Cardiovascular: Normal rate, regular rhythm and normal heart sounds.   No murmur heard. Pulmonary/Chest: Effort normal and breath sounds normal. No accessory muscle usage or stridor. No respiratory distress. She has no wheezes. She has no rhonchi. She has no rales.  Abdominal: Soft. Bowel sounds are normal. She exhibits no distension. There is no tenderness.  Musculoskeletal: Normal range of motion.  Lymphadenopathy:    She has no cervical adenopathy.  Neurological: She is alert and oriented to person, place, and time.  Speech clear without dysarthria.  Skin: Skin is warm and dry.  Psychiatric: She has a normal mood and affect. Her behavior is normal.    ED Course  Procedures (including critical care time) Labs Review Labs Reviewed  BASIC METABOLIC PANEL - Abnormal; Notable for the following:    Glucose, Bld 101 (*)    Calcium 8.8 (*)    All other components within normal limits  I-STAT CHEM 8, ED - Abnormal; Notable for the following:    Potassium 5.8 (*)    Glucose, Bld 102 (*)    Calcium, Ion 1.00 (*)    All other components within normal limits    Imaging Review Dg Chest 2 View  08/28/2015  CLINICAL DATA:  Cough EXAM: CHEST  2 VIEW COMPARISON:  06/02/2014 chest radiograph. FINDINGS: Surgical clips overlie the left axilla and left mediastinum. Stable cardiomediastinal silhouette with normal heart size. No pneumothorax. No pleural effusion. Stable pleural-parenchymal scarring at the lateral left lung base. Stable elevation of the left hemidiaphragm. No pulmonary edema. No acute consolidative airspace disease. IMPRESSION: No active cardiopulmonary disease. Stable pleural-parenchymal scarring at the lateral left lung base. Electronically Signed   By:  Ilona Sorrel M.D.   On: 08/28/2015 12:20   I have personally reviewed and evaluated these images and lab results as part of my medical decision-making.   EKG Interpretation   Date/Time:  Sunday August 28 2015 12:24:23 EDT Ventricular Rate:  90 PR Interval:  159 QRS Duration: 89 QT Interval:  331 QTC Calculation: 405 R Axis:   25 Text Interpretation:  Sinus rhythm Borderline repolarization abnormality  No significant change since last tracing Confirmed by [facility] MD, [place]  ([id number]) on 08/28/2015 3:09:23 PM     MDM   Final diagnoses:  URI (upper respiratory infection)   Patient presents with likely viral URI.  On exam, heart RRR, lungs CTAB, abdomen soft and benign.  HENT exam unremarkable.  Plan to obtain CXR and chem 8 to evaluate hgb given intermittent hemoptysis consisting of small streaks of blood.  Patient given ibuprofen and tessalon perle.  Low suspicion for PE given no CP, SOB.  CXR negative for acute pulmonary disease.  Doubt PNA. Chem 8 showed K 5.8.  Will obtain EKG and BMP. BP  98/66, this is lower than her normal baseline, will give IVF.   EKG shows no acute changes.  BMP shows K 3.7.  BP improved 118/63 with 1L NS  Anticipate discharge home with tessalon perles and ibuprofen.  Instructed patient to hold her lisinopril given softer BP.  Follow up PCP 2-3 days.  Discussed return precautions.  Patient agrees and acknowledges the above plan for discharge.     Gloriann Loan, PA-C 08/28/15 1514  Gloriann Loan, PA-C 08/28/15 1533  Gareth Morgan, MD 08/29/15 0000

## 2015-08-28 NOTE — ED Notes (Signed)
Sudden onset of URI symptoms Friday, congestion, productive cough, bil ear pain, head and eye pain

## 2015-08-28 NOTE — ED Notes (Addendum)
Pt reports not feeling well for the past 4 days.  Cough and bila ear pain.  Pt denies any dizziness or lightheadedness at this time.  Pt also denies any cp or SOB.  States she has been weak and spent laying in bed for the last 3 days.

## 2015-08-28 NOTE — ED Notes (Signed)
Bed: [id number] Expected date:  Expected time:  Means of arrival:  Comments: Fast Track

## 2015-09-06 ENCOUNTER — Other Ambulatory Visit: Payer: Self-pay | Admitting: Hematology & Oncology

## 2015-09-09 ENCOUNTER — Other Ambulatory Visit: Payer: Self-pay | Admitting: *Deleted

## 2015-09-09 MED ORDER — OMEPRAZOLE 20 MG PO CPDR: 20.0000 mg | DELAYED_RELEASE_CAPSULE | Freq: Every day | ORAL | Status: DC

## 2015-10-11 ENCOUNTER — Other Ambulatory Visit: Payer: Self-pay | Admitting: Hematology & Oncology

## 2015-11-24 ENCOUNTER — Other Ambulatory Visit: Payer: Self-pay | Admitting: Hematology & Oncology

## 2015-12-23 IMAGING — CT CT NECK W/ CM
3 of 5 series · 12 of 33 positions shown, 14 images · IV contrast (APPLIED)
Comparison: Radiography same day.  MRI chest 08/28/2013.

CLINICAL DATA: Previous surgery for glottic region mass. Sensation
of something stuck in her throat.

EXAM:
CT NECK WITH CONTRAST
TECHNIQUE: Multidetector CT imaging of the neck was performed using the
standard protocol following the bolus administration of intravenous
contrast.
CONTRAST:  75mL OMNIPAQUE IOHEXOL 300 MG/ML  SOLN

[Series 5: coronal st · coronal · 0.49mm/px · 3 of 99 slices shown]
[im 20/99  bone]
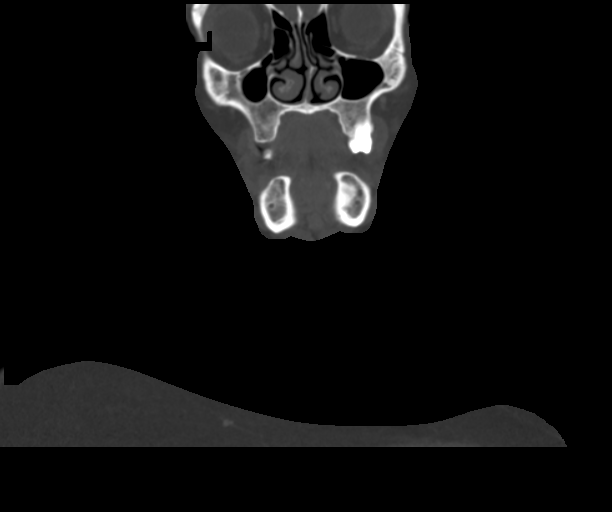
[im 40/99  bone]
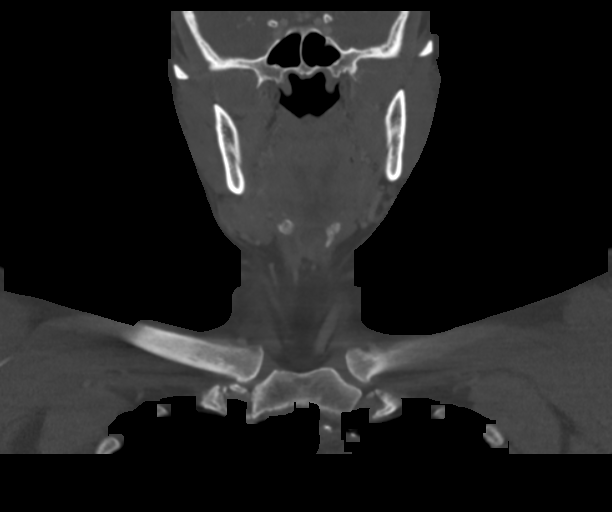
[im 59/99  bone]
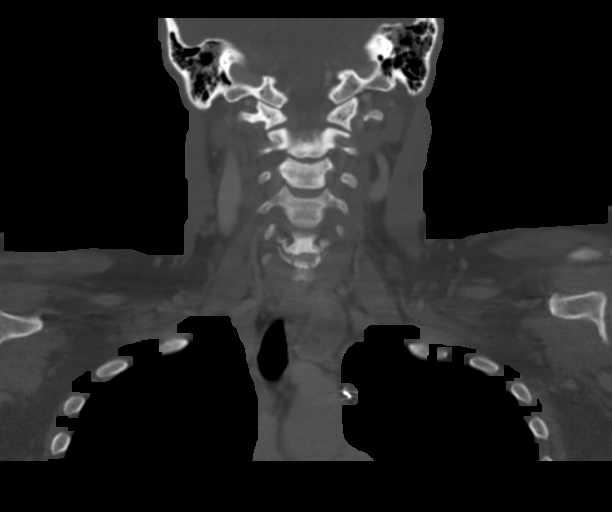

[Series 6: sagittal st · sagittal · 0.45mm/px · 5 of 75 slices shown, 6 images]
[im 25/75  bone]
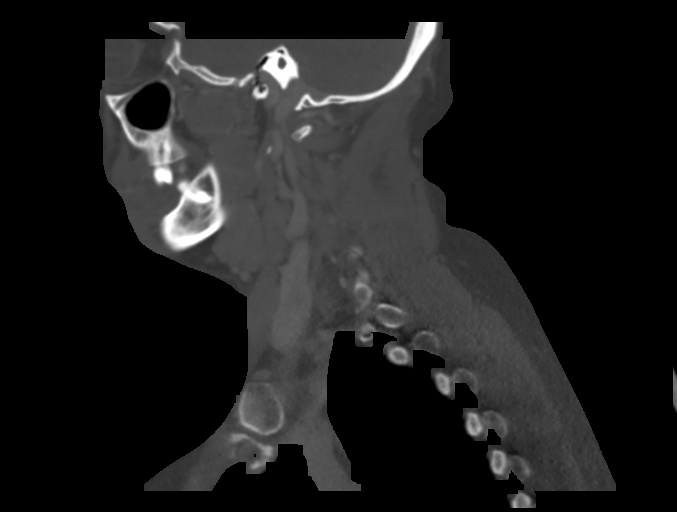
[im 31/75  bone]
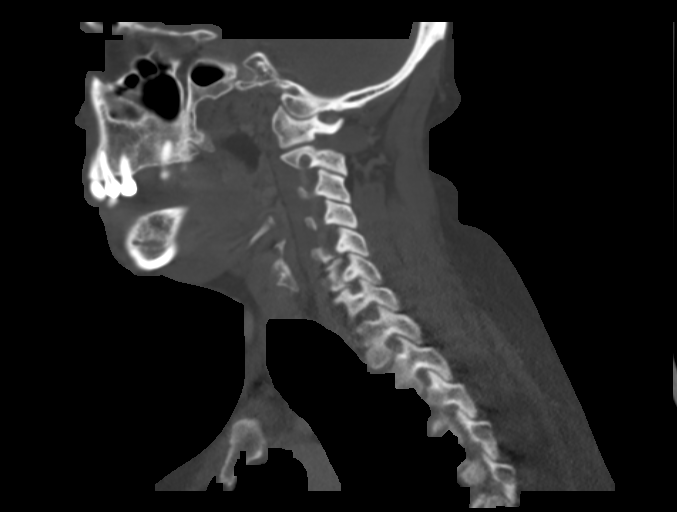
[im 38/75  soft-tissue]
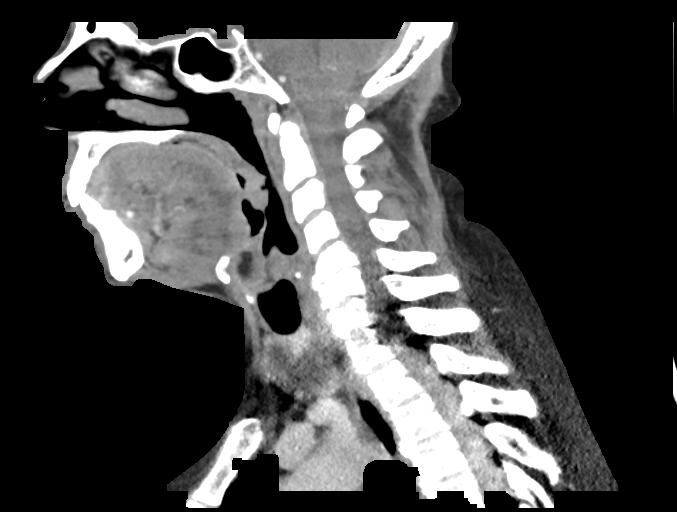
[im 38/75  bone]
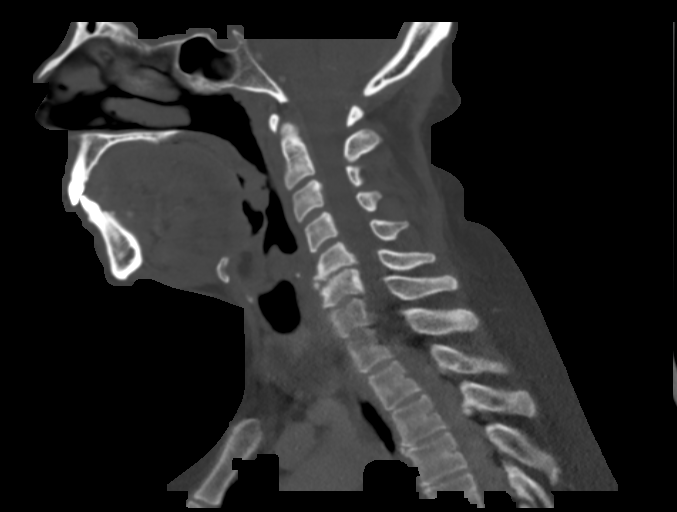
[im 44/75  bone]
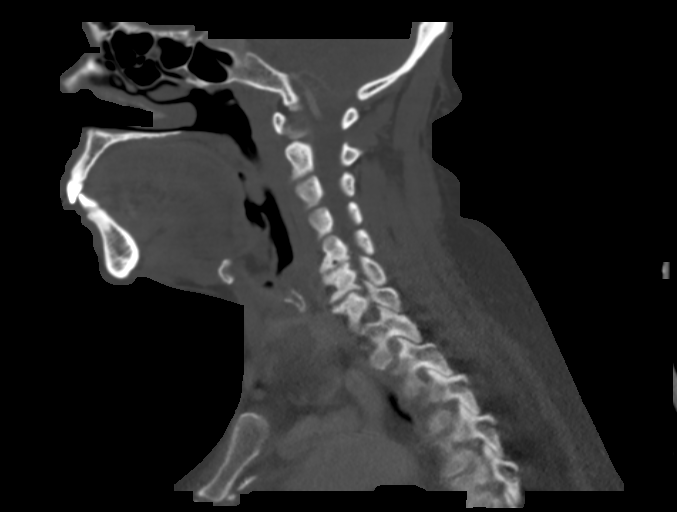
[im 50/75  bone]
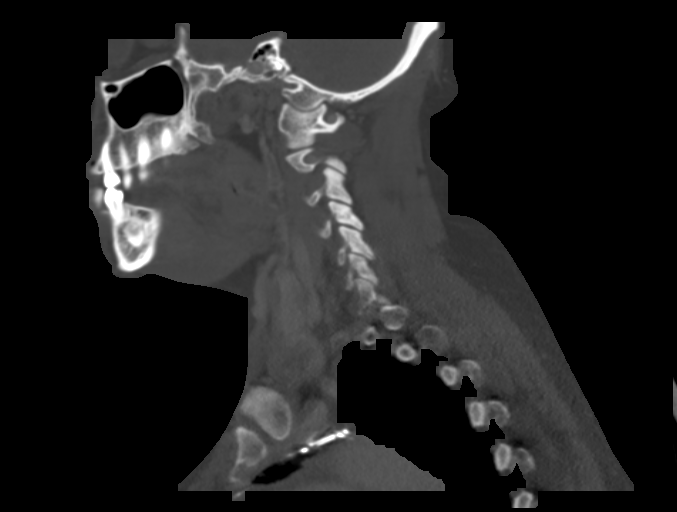

[Series 7: orthogonal st · axial · 0.39mm/px · z∈[+1117,+1260]mm · 4 of 123 slices shown, 5 images]
[im 25/123  soft-tissue]
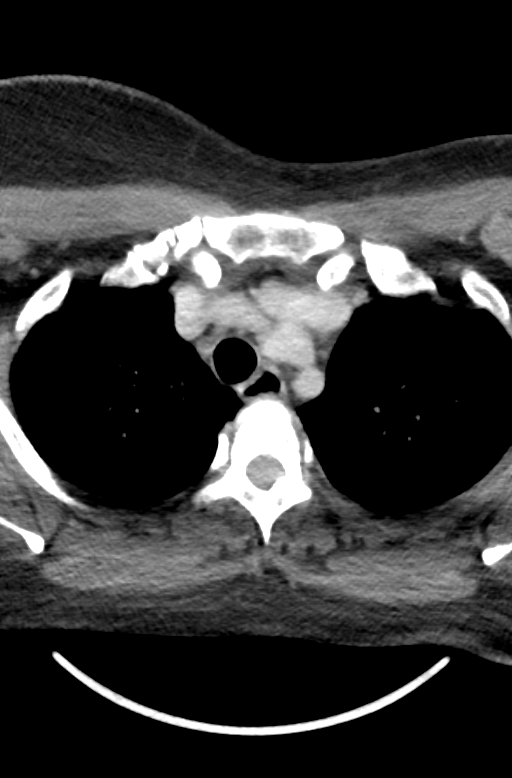
[im 25/123  bone]
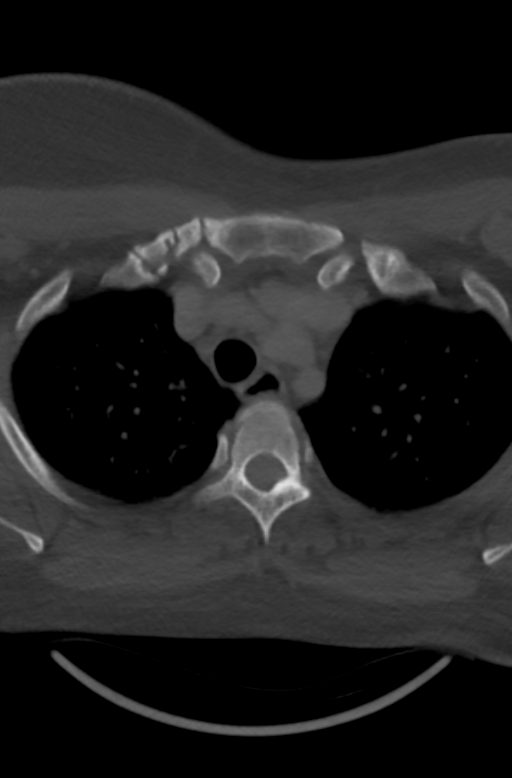
[im 49/123  bone]
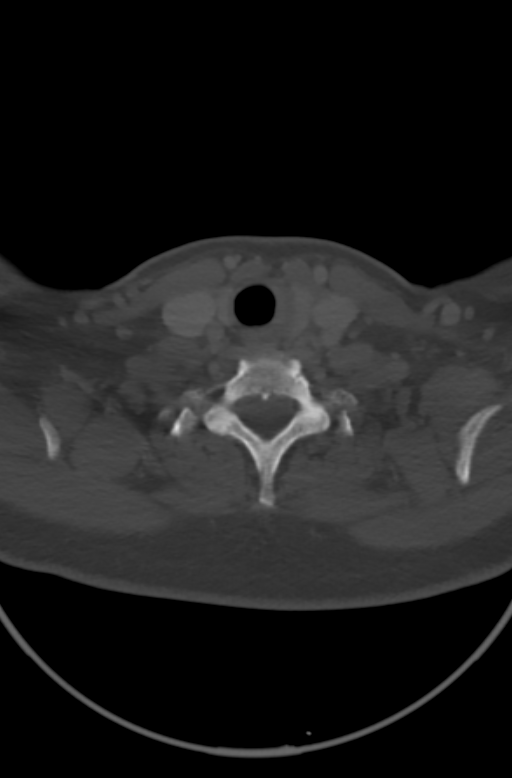
[im 74/123  bone]
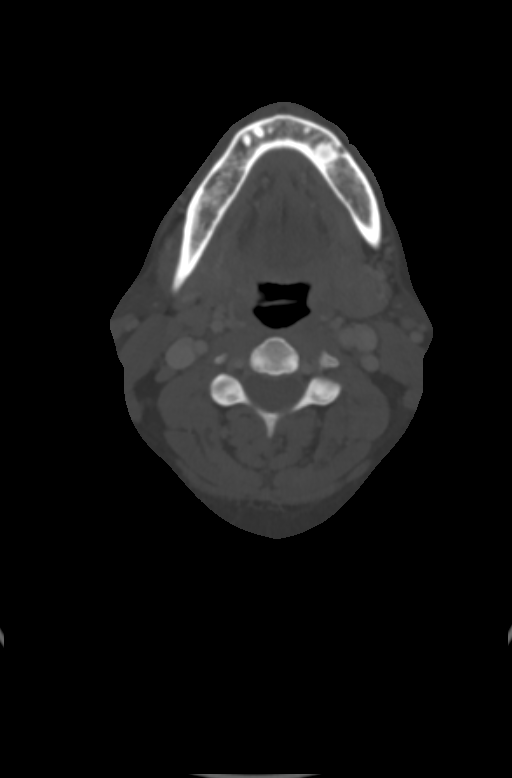
[im 98/123  bone]
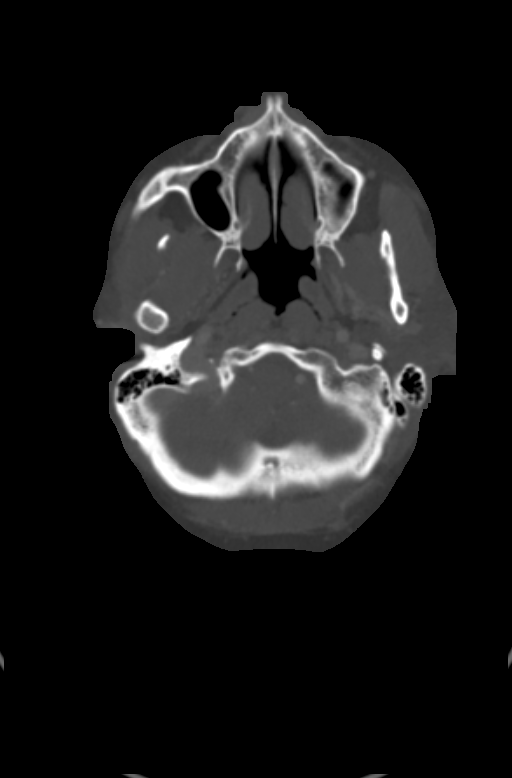

[12 of 33 positions shown; findings below may reference images not displayed]

FINDINGS: Lung apices are clear except for a 2 mm subpleural nodule in the
right upper lobe on image 35. Surgical clips are present in the
anterior mediastinum. There is a 9 x 12 mm right paratracheal lymph
node as demonstrated on the recent MRI.

Both parotid glands are normal. There are normal intra parotid lymph
nodes. Both submandibular glands are normal. The thyroid gland shows
a dominant mass in the left lobe measuring 4 by 3.5 by 3.5 cm with a
complex internal appearance. This was demonstrated on an MRI scan
done yesterday. Ultrasound and possible biopsy were recommended and
that recommendation is repeated.

There is distortion in the region of the glottis. There is probably
vocal cord paralysis on the left. I cannot see a focal mucosal or
submucosal mass. I do not see any impacted foreign object in either
the airway or the region of the esophagus imaged.

No definitely pathologically enlarged lymph nodes are seen. As noted
at the chest MRI, there are level 4 and level 5 nodes that are
slightly prominent, the largest being a left supraclavicular node
measuring 9 by 6 mm.

Ordinary spondylosis affects the cervical spine.
IMPRESSION: No evidence of impacted foreign object.

Complex left thyroid mass measuring 4 x 3.5 x 3.5 cm. As suggested
at MRI, ultrasound is recommended, with consideration of subsequent
biopsy.

Vocal cord paralysis on the left. Definite mucosal or submucosal
mass is not identified. Anatomic distortion because of vocal cord
paralysis makes detection of mucosal lesions more difficult.
Consider endoscopic evaluation.

## 2015-12-28 ENCOUNTER — Emergency Department (HOSPITAL_COMMUNITY)
Admission: EM | Admit: 2015-12-28 | Discharge: 2015-12-28 | Disposition: A | Discharge status: Home / Self Care | Payer: Medicaid Other | Attending: Emergency Medicine | Admitting: Emergency Medicine

## 2015-12-28 ENCOUNTER — Encounter (HOSPITAL_COMMUNITY): Payer: Self-pay | Admitting: Emergency Medicine

## 2015-12-28 DIAGNOSIS — I10 Essential (primary) hypertension: Secondary | ICD-10-CM | POA: Insufficient documentation

## 2015-12-28 DIAGNOSIS — Z791 Long term (current) use of non-steroidal anti-inflammatories (NSAID): Secondary | ICD-10-CM | POA: Diagnosis not present

## 2015-12-28 DIAGNOSIS — J0101 Acute recurrent maxillary sinusitis: Secondary | ICD-10-CM | POA: Diagnosis not present

## 2015-12-28 DIAGNOSIS — Z79899 Other long term (current) drug therapy: Secondary | ICD-10-CM | POA: Diagnosis not present

## 2015-12-28 DIAGNOSIS — R0981 Nasal congestion: Secondary | ICD-10-CM | POA: Diagnosis present

## 2015-12-28 DIAGNOSIS — Z853 Personal history of malignant neoplasm of breast: Secondary | ICD-10-CM | POA: Insufficient documentation

## 2015-12-28 MED ORDER — FLUTICASONE PROPIONATE 50 MCG/ACT NA SUSP: 1.0000 | Freq: Every day | NASAL | Status: DC

## 2015-12-28 MED ORDER — DEXAMETHASONE 4 MG PO TABS
10.0000 mg | ORAL_TABLET | Freq: Once | ORAL | Status: AC
  Administered 2015-12-28: 10 mg via ORAL
  Filled 2015-12-28: qty 2

## 2015-12-28 MED ORDER — OXYMETAZOLINE HCL 0.05 % NA SOLN: 1.0000 | Freq: Two times a day (BID) | NASAL | Status: DC | PRN

## 2015-12-28 MED ORDER — AMOXICILLIN-POT CLAVULANATE 875-125 MG PO TABS: 1.0000 | ORAL_TABLET | Freq: Two times a day (BID) | ORAL | Status: DC

## 2015-12-28 NOTE — ED Notes (Addendum)
Pt c/o left ear pain since last night. Pt reports sharp pain intermittently radiates from the top of the ear into the face. Pt also reports cough and congestion x2 days. Denies N/V/D.

## 2015-12-28 NOTE — Discharge Instructions (Signed)
Your symptoms are likely due to sinus inflammation and infection. It is important to take the medications as prescribed. For the oxymetazoline (Afrin), use it twice a day for 2-3 days, then STOP. It is important not to use this for more than 3 days as it can make symptoms worse and you can become dependent on it. If you start to develop any face drooping, difficulty swallowing, difficulty speaking, loss of hearing, or other concerning symptoms, return to the ED If your symptoms do not improve, follow-up with your doctor in 3-5 days

## 2015-12-28 NOTE — ED Provider Notes (Signed)
CSN: [id number]     Arrival date & time 12/28/15  0730 History   First MD Initiated Contact with Patient 12/28/15 [phone]     Chief Complaint  Patient presents with  . Otalgia     (Consider location/radiation/quality/duration/timing/severity/associated sxs/prior Treatment) The history is provided by the patient.    55 yo F with PMHx of HTN who presents with facial fullness, pain, and sharp left facial pain. The patient states that over the last several days, she has had increased nasal congestion and left sided facial sinus pain. She has a history of recurrent sinus issues, but has never seen a specialist for this. She endorses nasal congestion and intermittent drainage for several weeks leading up to the worsening of symptoms over the last several days. She states that yesterday, she began to notice sharp shooting pain starting just above her left ear and shooting towards her nose. Denies any pain in the ear. Denies any tinnitus or hearing changes. She also noticed that she has a similar pain when she chews food on the left side of her mouth but denies any significant pain in her teeth. Denies any recent dental trauma. Denies any facial numbness or weakness. Denies any vision changes or pain with extraocular movements.   Past Medical History  Diagnosis Date  . Thyroid disease   . Hypertension   . Acid reflux   . Vertigo   . Essential hypertension 02/16/2015  . Obesity 02/16/2015  . Cancer [facility])     breast cancer   Past Surgical History  Procedure Laterality Date  . Abdominal hysterectomy    . Mastectomy    . Esophageal dilation     Family History  Problem Relation Age of Onset  . Asthma Mother   . Diabetes Father   . Breast cancer Paternal Aunt    Social History  Substance Use Topics  . Smoking status: Never Smoker   . Smokeless tobacco: Never Used     Comment: never used tobacco  . Alcohol Use: No   OB History    Gravida Para Term Preterm AB TAB SAB Ectopic Multiple Living    '2 2 2       2     '$ Review of Systems  Constitutional: Negative for fever, chills and fatigue.  HENT: Positive for congestion, ear pain and sinus pressure. Negative for dental problem, ear discharge, facial swelling, rhinorrhea, trouble swallowing and voice change.   Eyes: Negative for visual disturbance.  Respiratory: Negative for cough and shortness of breath.   Cardiovascular: Negative for chest pain and leg swelling.  Gastrointestinal: Negative for nausea, vomiting, abdominal pain and diarrhea.  Genitourinary: Negative for dysuria and flank pain.  Musculoskeletal: Negative for neck pain and neck stiffness.  Skin: Negative for rash.  Allergic/Immunologic: Negative for immunocompromised state.  Neurological: Negative for syncope and weakness.      Allergies  Aspirin  Home Medications   Prior to Admission medications   Medication Sig Start Date End Date Taking? Authorizing Provider  albuterol (PROVENTIL HFA;VENTOLIN HFA) 108 (90 BASE) MCG/ACT inhaler Inhale 2 puffs into the lungs every 4 (four) hours as needed for wheezing or shortness of breath. 12/15/14   Volanda Napoleon, MD  benzonatate (TESSALON) 100 MG capsule Take 1 capsule (100 mg total) by mouth every 8 (eight) hours. 08/28/15   Gloriann Loan, PA-C  ibuprofen (ADVIL,MOTRIN) 800 MG tablet Take 1 tablet (800 mg total) by mouth 3 (three) times daily. 08/28/15   Gloriann Loan, PA-C  levothyroxine (SYNTHROID,  LEVOTHROID) 150 MCG tablet TAKE ONE TABLET BY MOUTH ONCE DAILY BEFORE BREAKFAST 06/24/15   Volanda Napoleon, MD  levothyroxine (SYNTHROID, LEVOTHROID) 150 MCG tablet TAKE ONE TABLET BY MOUTH ONCE DAILY BEFORE BREAKFAST 11/25/15   Volanda Napoleon, MD  lisinopril-hydrochlorothiazide (PRINZIDE,ZESTORETIC) 10-12.5 MG per tablet Take 1 tablet by mouth daily. 03/01/15   Volanda Napoleon, MD  meclizine (ANTIVERT) 50 MG tablet Take 1 tablet (50 mg total) by mouth 3 (three) times daily as needed. Patient taking differently: Take 50 mg by mouth 3  (three) times daily as needed for dizziness.  01/22/15   Pattricia Boss, MD  omeprazole (PRILOSEC) 20 MG capsule Take 1 capsule (20 mg total) by mouth daily. 09/09/15   Volanda Napoleon, MD   BP 147/91 mmHg  Pulse 84  Temp(Src) 98 F (36.7 C) (Oral)  Resp 16  Ht '5\' 4"'$  (1.626 m)  Wt 205 lb (92.987 kg)  BMI 35.17 kg/m2  SpO2 98% Physical Exam  Constitutional: She appears well-developed and well-nourished. No distress.  HENT:  Head: Normocephalic and atraumatic.  Right Ear: Tympanic membrane and external ear normal. No mastoid tenderness. No middle ear effusion.  Left Ear: Tympanic membrane and external ear normal. No mastoid tenderness.  No middle ear effusion.  Nose: Mucosal edema present. Left sinus exhibits maxillary sinus tenderness.  Opacification and TTP over left maxillary sinus. No overlying skin changes, redness, or fluctuance. Mild tenderness to percussion of upper left teeth but no gingival abscess. No facial swelling or edema. Sensation fully intact along trigeminal distribution. No TTP over left temporal area. No pain with EOM.  Eyes: Conjunctivae are normal. Pupils are equal, round, and reactive to light.  Neck: Normal range of motion. Neck supple.  Cardiovascular: Normal rate, regular rhythm and normal heart sounds.  Exam reveals no friction rub.   No murmur heard. Pulmonary/Chest: Effort normal and breath sounds normal. No respiratory distress. She has no wheezes. She has no rales.  Abdominal: Soft. She exhibits no distension. There is no tenderness.  Neurological: She is alert.  Skin: Skin is warm. No rash noted.  Nursing note and vitals reviewed.   Neurological Exam:  - Cranial Nerves: Visual fields intact to confrontation in all quadrants bilaterally. EOMI and PERRLA. No nystagmus noted. Facial sensation intact at forehead, maxillary cheek, and chin/mandible bilaterally. No weakness of masticatory muscles. No facial asymmetry or weakness. Hearing grossly normal to finer  rub. Uvula is midline, and palate elevates symmetrically. Normal SCM and trapezius strength. Tongue midline without fasciculations  ED Course  Procedures (including critical care time) Labs Review Labs Reviewed - No data to display  Imaging Review No results found. I have personally reviewed and evaluated these images and lab results as part of my medical decision-making.   EKG Interpretation None      MDM  55 yo F with remote PMHx of breast CA who presents with a several week h/o maxillary sinus tenderness, nasal congestion, and now sharp left-sided facial pain. See HPI above. VSS and WNL. Exam is as above. Exam, history is most c/w likely acute to subacute maxillary sinusitis with subsequent pain. No HA, neck stiffness, fever, and pt is well appearing - do not suspect meningitis or encephalitis. No fever, chills, or signs of systemic illness. No evidence of AOM or OE. No mastoid TTP or signs of mastoiditis. Also on DDx is trigeminal neuralgia, though less likely in setting of sinus sx as above. Pt does also have a h/o breast CA but this  is in remission and pt has no focal neuro deficits to suggest intracranial mass/lesion or met. I discussed my findings, diagnosis with pt. Advised her to f/u with PCP in 1 week for repeat exam and check-up. Will give decadron for sinus inflammation, start augmentin and given short course of nasal spray for sinus drainage. Pt in agreement. Return precautions given.  Clinical Impression: 1. Acute recurrent maxillary sinusitis     Disposition: Discharge  Condition: Good  I have discussed the results, Dx and Tx plan with the pt(& family if present). He/she/they expressed understanding and agree(s) with the plan. Discharge instructions discussed at great length. Strict return precautions discussed and pt &/or family have verbalized understanding of the instructions. No further questions at time of discharge.    Discharge Medication List as of 12/28/2015   8:07 AM    START taking these medications   Details  amoxicillin-clavulanate (AUGMENTIN) 875-125 MG tablet Take 1 tablet by mouth every 12 (twelve) hours., Starting 12/28/2015, Until Discontinued, Print    fluticasone (FLONASE) 50 MCG/ACT nasal spray Place 1 spray into both nostrils daily., Starting 12/28/2015, Until Discontinued, Print    oxymetazoline (AFRIN NASAL SPRAY) 0.05 % nasal spray Place 1 spray into both nostrils 2 (two) times daily as needed for congestion. Do NOT use for more than 3 days., Starting 12/28/2015, Until Discontinued, Print        Follow Up: [place]aryl [place], [place] [id number] [phone]   Follow-up in 5-7 days if symptoms worsen or do not improve      Duffy Bruce, MD 12/28/15 [phone]

## 2016-01-06 ENCOUNTER — Other Ambulatory Visit: Payer: Self-pay | Admitting: Hematology & Oncology

## 2016-02-08 ENCOUNTER — Other Ambulatory Visit: Payer: Self-pay | Admitting: Hematology & Oncology

## 2016-03-21 ENCOUNTER — Other Ambulatory Visit: Payer: Self-pay | Admitting: Hematology & Oncology

## 2016-05-01 ENCOUNTER — Other Ambulatory Visit: Payer: Self-pay | Admitting: Hematology & Oncology

## 2016-05-01 DIAGNOSIS — C50912 Malignant neoplasm of unspecified site of left female breast: Secondary | ICD-10-CM

## 2016-05-01 DIAGNOSIS — J45901 Unspecified asthma with (acute) exacerbation: Secondary | ICD-10-CM

## 2016-05-01 DIAGNOSIS — E032 Hypothyroidism due to medicaments and other exogenous substances: Secondary | ICD-10-CM

## 2016-05-01 MED FILL — PROAIR HFA 90 MCG INHALER: 108 (90 BAS | 30 days supply | Qty: 9 | Fill #0

## 2016-05-01 MED FILL — OMEPRAZOLE DR 20 MG CAPSULE: 20 | 30 days supply | Qty: 30 | Fill #0

## 2016-05-02 ENCOUNTER — Other Ambulatory Visit: Payer: Self-pay | Admitting: Hematology & Oncology

## 2016-05-28 ENCOUNTER — Other Ambulatory Visit: Payer: Self-pay | Admitting: Hematology & Oncology

## 2016-07-09 ENCOUNTER — Other Ambulatory Visit: Payer: Self-pay | Admitting: Hematology & Oncology

## 2016-07-13 ENCOUNTER — Other Ambulatory Visit: Payer: Self-pay | Admitting: Hematology & Oncology

## 2016-07-17 ENCOUNTER — Emergency Department (HOSPITAL_COMMUNITY)
Admission: EM | Admit: 2016-07-17 | Discharge: 2016-07-17 | Disposition: A | Discharge status: Home / Self Care | Payer: Medicaid Other | Attending: Emergency Medicine | Admitting: Emergency Medicine

## 2016-07-17 ENCOUNTER — Encounter (HOSPITAL_COMMUNITY): Payer: Self-pay

## 2016-07-17 DIAGNOSIS — R05 Cough: Secondary | ICD-10-CM | POA: Diagnosis present

## 2016-07-17 DIAGNOSIS — Z79899 Other long term (current) drug therapy: Secondary | ICD-10-CM | POA: Insufficient documentation

## 2016-07-17 DIAGNOSIS — J069 Acute upper respiratory infection, unspecified: Secondary | ICD-10-CM | POA: Diagnosis not present

## 2016-07-17 DIAGNOSIS — Z853 Personal history of malignant neoplasm of breast: Secondary | ICD-10-CM | POA: Diagnosis not present

## 2016-07-17 DIAGNOSIS — I10 Essential (primary) hypertension: Secondary | ICD-10-CM | POA: Insufficient documentation

## 2016-07-17 NOTE — ED Provider Notes (Signed)
[facility] DEPT Provider Note   CSN: [id number] Arrival date & time: 07/17/16  0206     History   Chief Complaint Chief Complaint  Patient presents with  . Cough  . Throat irritation    HPI [name] is a 56 y.o. female.  She was in the waiting room with her son when she developed a scratching in her throat and started having a cough productive of some clear sputum. She denies sore throat and denies rhinorrhea. She denies fever, chills, sweats. There is been no nausea or vomiting. She denies arthralgias or myalgias.   The history is provided by the patient.  Cough     Past Medical History:  Diagnosis Date  . Acid reflux   . Cancer [facility])    breast cancer  . Essential hypertension 02/16/2015  . Hypertension   . Obesity 02/16/2015  . Thyroid disease   . Vertigo     Patient Active Problem List   Diagnosis Date Noted  . Essential hypertension 02/16/2015  . Obesity 02/16/2015  . Acute sinus infection 06/09/2014  . Adenocarcinoma of left breast ([place]) 10/27/2013    Past Surgical History:  Procedure Laterality Date  . ABDOMINAL HYSTERECTOMY    . ESOPHAGEAL DILATION    . MASTECTOMY      OB History    Gravida Para Term Preterm AB Living   2 2 2     2    SAB TAB Ectopic Multiple Live Births                   Home Medications    Prior to Admission medications   Medication Sig Start Date End Date Taking? Authorizing Provider  amoxicillin-clavulanate (AUGMENTIN) 875-125 MG tablet Take 1 tablet by mouth every 12 (twelve) hours. 12/28/15   Duffy Bruce, MD  benzonatate (TESSALON) 100 MG capsule Take 1 capsule (100 mg total) by mouth every 8 (eight) hours. 08/28/15   Kayla Rose, PA-C  fluticasone (FLONASE) 50 MCG/ACT nasal spray Place 1 spray into both nostrils daily. 12/28/15   Duffy Bruce, MD  ibuprofen (ADVIL,MOTRIN) 800 MG tablet Take 1 tablet (800 mg total) by mouth 3 (three) times daily. 08/28/15   Gloriann Loan, PA-C  levothyroxine (SYNTHROID,  LEVOTHROID) 150 MCG tablet TAKE ONE TABLET BY MOUTH ONCE DAILY BEFORE BREAKFAST 06/24/15   Volanda Napoleon, MD  levothyroxine (SYNTHROID, LEVOTHROID) 150 MCG tablet TAKE ONE TABLET BY MOUTH ONCE DAILY BEFORE BREAKFAST 07/09/16   Volanda Napoleon, MD  levothyroxine (SYNTHROID, LEVOTHROID) 150 MCG tablet TAKE ONE TABLET BY MOUTH ONCE DAILY BEFORE  BREAKFAST 07/13/16   Eliezer Bottom, NP  lisinopril-hydrochlorothiazide (PRINZIDE,ZESTORETIC) 10-12.5 MG per tablet Take 1 tablet by mouth daily. 03/01/15   Volanda Napoleon, MD  meclizine (ANTIVERT) 50 MG tablet Take 1 tablet (50 mg total) by mouth 3 (three) times daily as needed. Patient taking differently: Take 50 mg by mouth 3 (three) times daily as needed for dizziness.  01/22/15   Pattricia Boss, MD  omeprazole (PRILOSEC) 20 MG capsule Take 1 capsule (20 mg total) by mouth daily. 09/09/15   Volanda Napoleon, MD  oxymetazoline (AFRIN NASAL SPRAY) 0.05 % nasal spray Place 1 spray into both nostrils 2 (two) times daily as needed for congestion. Do NOT use for more than 3 days. 12/28/15   Duffy Bruce, MD  PROVENTIL HFA 108 [phone] Base) MCG/ACT inhaler INHALE 2 PUFFS BY MOUTH INTO THE LUNGS EVERY 4 HOURS AS NEEDED FOR WHEEZING OR SHORTNESS OF BREATH 05/01/16  Volanda Napoleon, MD    Family History Family History  Problem Relation Age of Onset  . Asthma Mother   . Diabetes Father   . Breast cancer Paternal Aunt     Social History Social History  Substance Use Topics  . Smoking status: Never Smoker  . Smokeless tobacco: Never Used     Comment: never used tobacco  . Alcohol use No     Allergies   Aspirin   Review of Systems Review of Systems  Respiratory: Positive for cough.   All other systems reviewed and are negative.    Physical Exam Updated Vital Signs BP (!) 173/101 (BP Location: Right Arm)   Pulse 87   Temp 98 F (36.7 C) (Oral)   Resp 17   Ht 5\' 4"  (1.626 m)   Wt 205 lb (93 kg)   SpO2 99%   BMI 35.19 kg/m   Physical Exam    Nursing note and vitals reviewed.  56 year old female, resting comfortably and in no acute distress. Vital signs are significant for hypertension. Oxygen saturation is 99%, which is normal. Head is normocephalic and atraumatic. PERRLA, EOMI. Oropharynx is clear. Voice is hoarse, but patient states that this is her normal voice. Neck is nontender and supple without adenopathy or JVD. Back is nontender and there is no CVA tenderness. Lungs are clear without rales, wheezes, or rhonchi. Chest is nontender. Heart has regular rate and rhythm without murmur. Abdomen is soft, flat, nontender without masses or hepatosplenomegaly and peristalsis is normoactive. Extremities have no cyanosis or edema, full range of motion is present. Skin is warm and dry without rash. Neurologic: Mental status is normal, cranial nerves are intact, there are no motor or sensory deficits.   ED Treatments / Results   Procedures Procedures (including critical care time)  Medications Ordered in ED Medications - No data to display   Initial Impression / Assessment and Plan / ED Course  I have reviewed the triage vital signs and the nursing notes.  Scattered tract infection. This appears to be viral. No indication for imaging or labs. She will would be a candidate for antiviral medication for influenza. I discussed this with the patient which she states that she is not interested in taking oral antiviral medications. Therefore, influenza testing is not done. She is given routine URI instructions. Advised to monitor her blood pressure at home. Of note, she has not taken her blood pressure medication in the last 2 days, but did take a dose while in the waiting room.  Final Clinical Impressions(s) / ED Diagnoses   Final diagnoses:  Viral upper respiratory tract infection    New Prescriptions New Prescriptions   No medications on file     Delora Fuel, MD [id number]

## 2016-07-17 NOTE — ED Triage Notes (Addendum)
Pt presents with c/o cough and irritation of her throat. Pt reports that she was sitting in the lobby waiting with another patient who is waiting to be seen and she started to have a dry cough and have some irritation in her throat. Pt reports that she felt some shortness of breath when she started coughing but that has since resolved. Pt denies any chest pain. Pt's voice is hoarse but she reports this is normal for her.

## 2016-08-21 IMAGING — US US SOFT TISSUE HEAD/NECK
1 series · 14 of 25 positions shown · non-contrast
Comparison: 09/08/2013

CLINICAL DATA: 53-year-old female with a history of thyroid
nodules.

EXAM:
THYROID ULTRASOUND
TECHNIQUE: Ultrasound examination of the thyroid gland and adjacent soft
tissues was performed.

[Series 1: us soft tissue head/neck · 0.07mm/px · 14 of 45 slices shown]
[im 1/45]
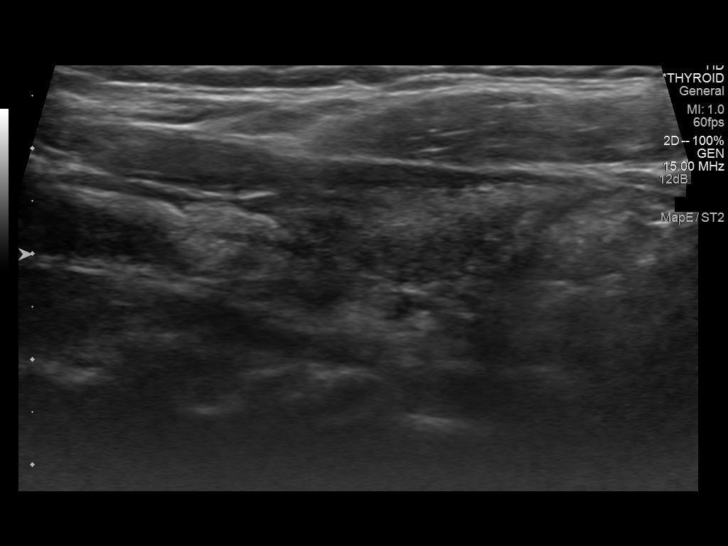
[im 4/45]
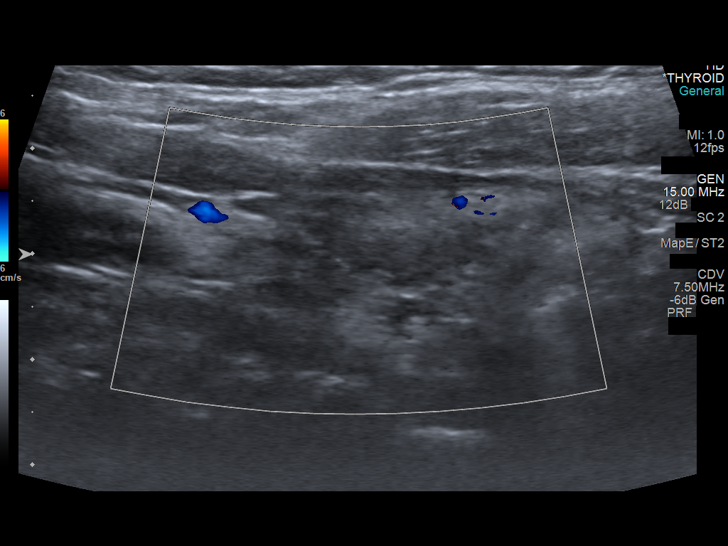
[im 8/45]
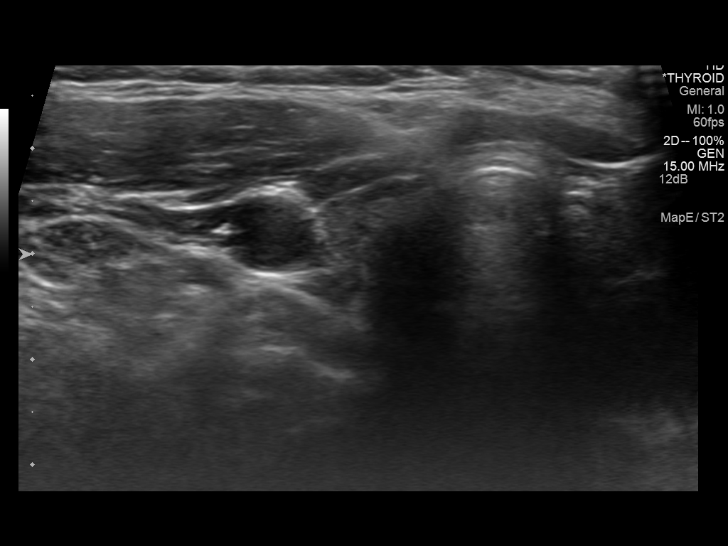
[im 12/45]
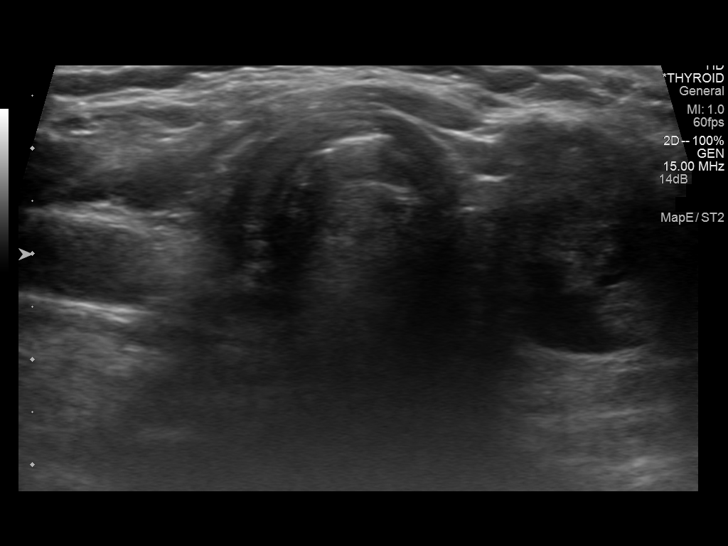
[im 15/45]
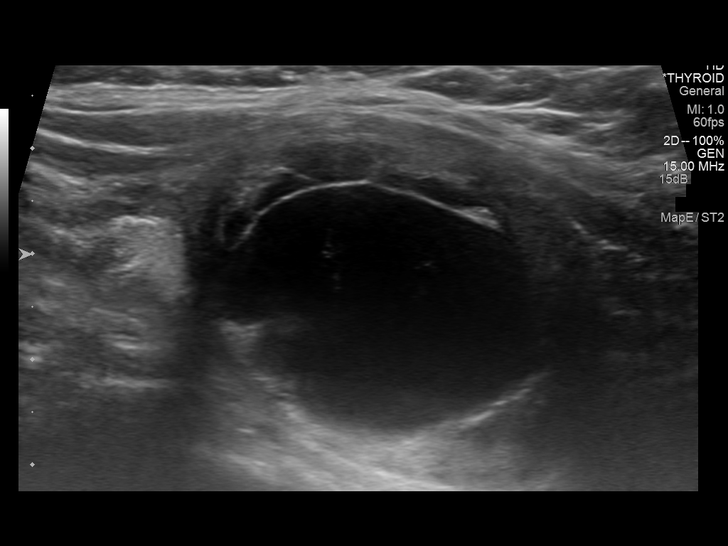
[im 17/45]
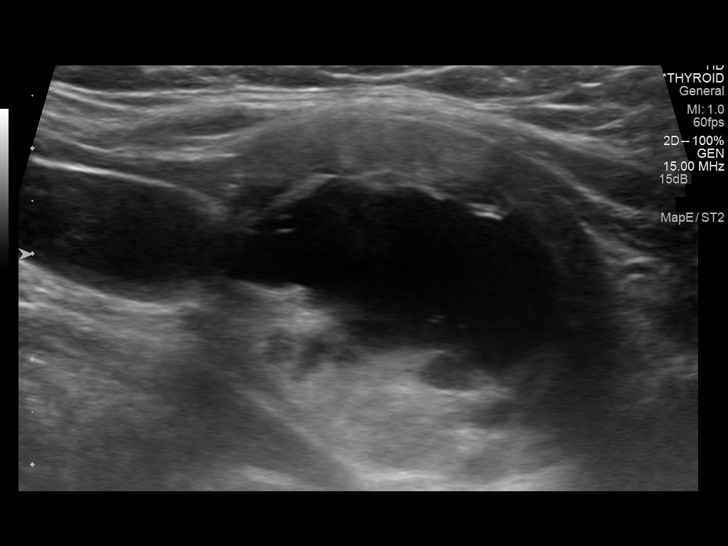
[im 21/45]
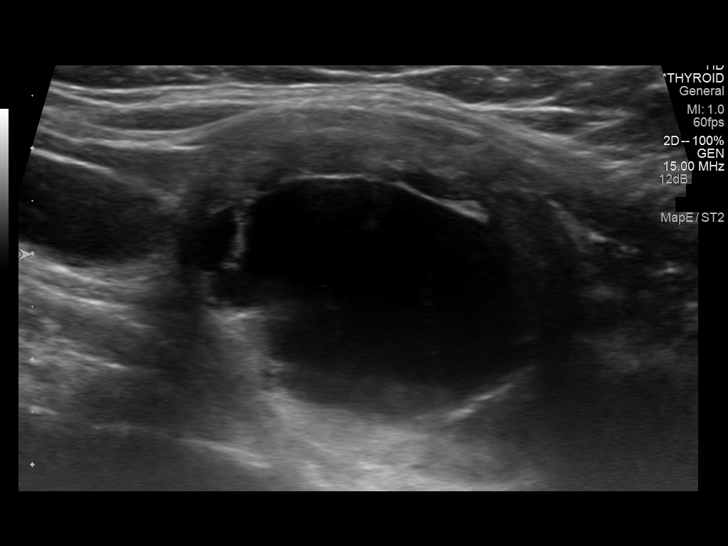
[im 24/45]
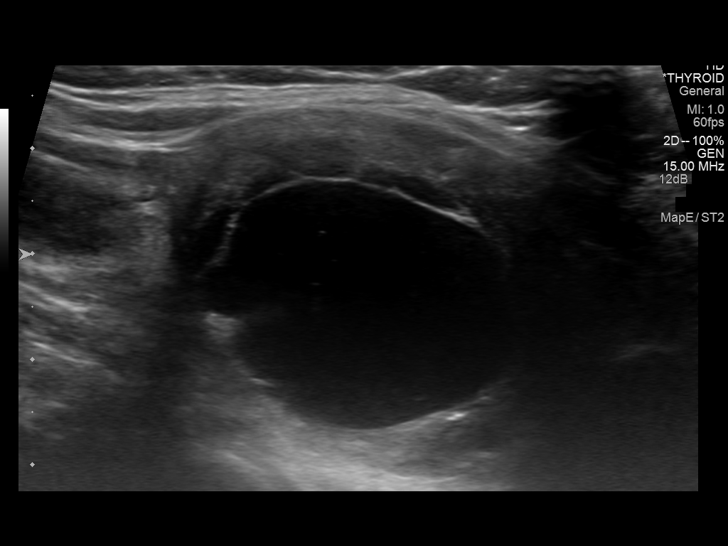
[im 28/45]
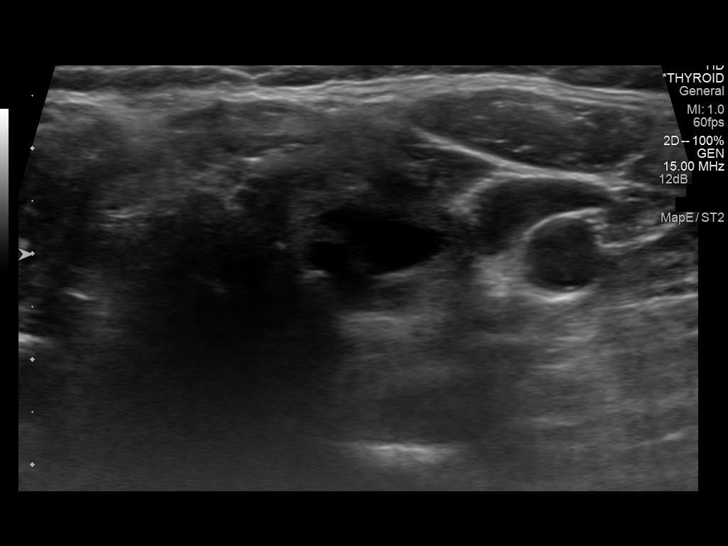
[im 30/45]
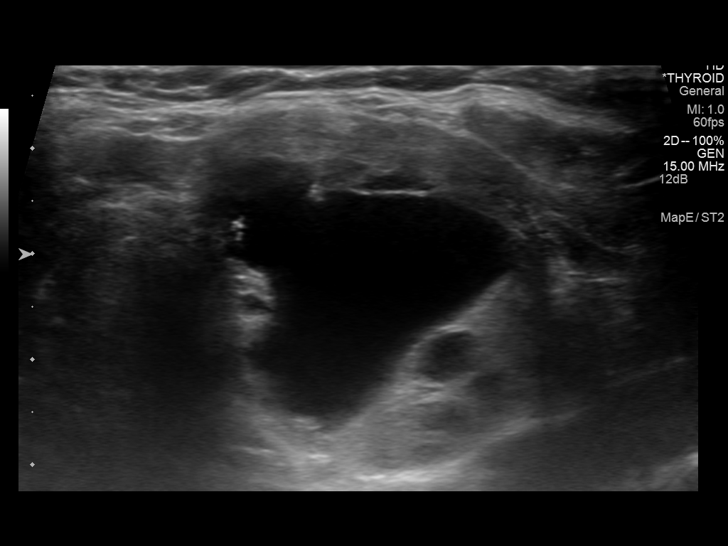
[im 34/45]
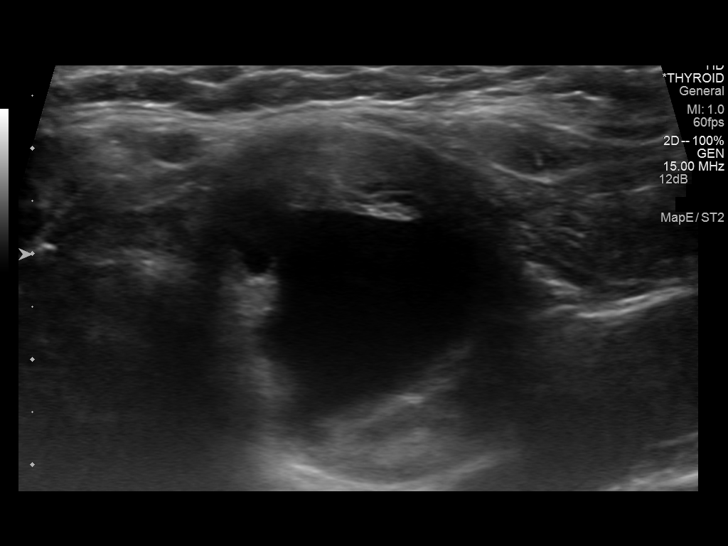
[im 37/45]
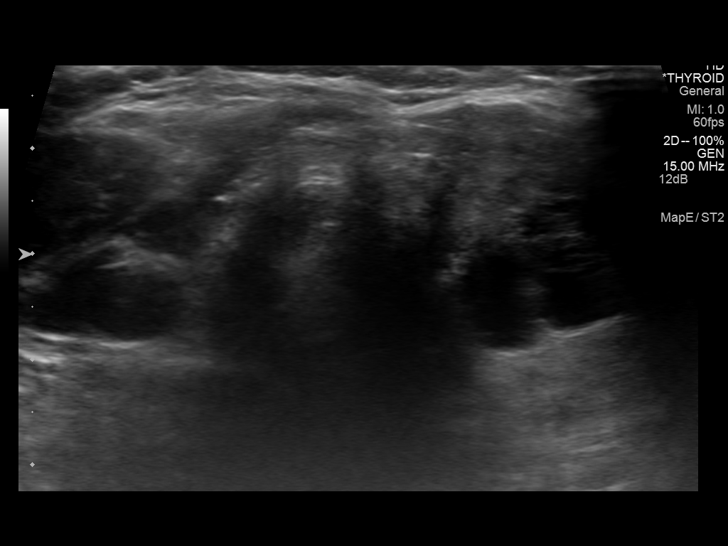
[im 41/45]
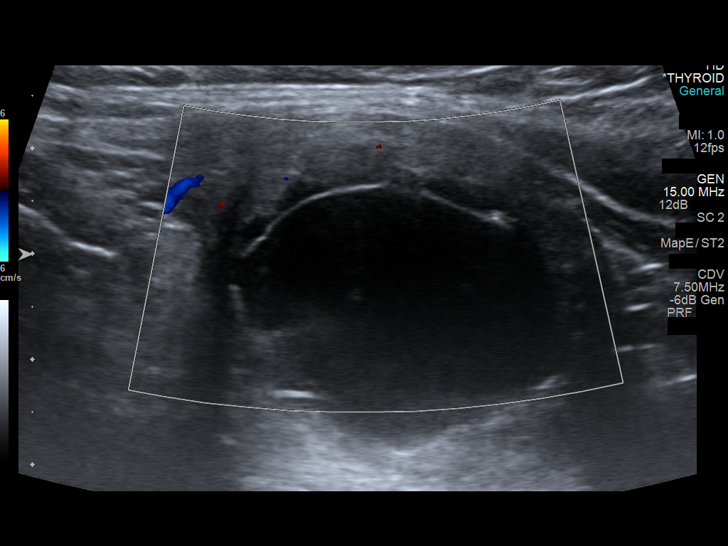
[im 45/45]
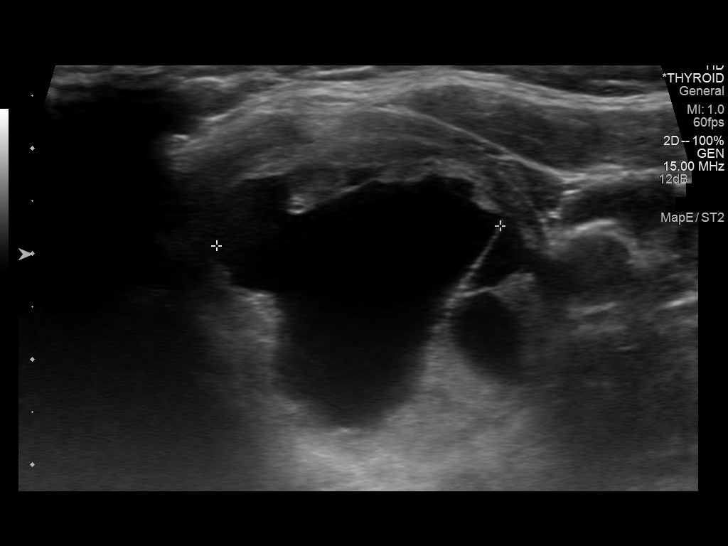

[14 of 25 positions shown; findings below may reference images not displayed]

FINDINGS: Right thyroid lobe

Measurements: 2.3 cm x 0.9 cm x 0.6 cm.  No nodules visualized.

Left thyroid lobe

Measurements: 4.6 cm x 3.1 cm x 3.4 cm. Dominant nodule on the left
with partially cystic and partially solid components. This nodule
has been previously biopsied (09/15/2013, 01/14/2014). Current
measurement 3.6 cm x 2.9 cm x 3.0 cm. This is slightly smaller than
the previous measurement, status post aspiration.

Isthmus

Thickness: 3 mm.  No nodules visualized.

Lymphadenopathy

None visualized.
IMPRESSION: Re- demonstration of a dominant left-sided nodule with cystic and
solid components. This nodule has been previously biopsied and
aspirated, now measuring slightly smaller than previously, with
greatest diameter 3.6 cm.

No new nodule identified.

## 2016-11-13 ENCOUNTER — Other Ambulatory Visit: Payer: Self-pay | Admitting: Family

## 2017-06-02 IMAGING — CT CT HEAD W/O CM
2 series · 16 of 30 positions shown, 19 images · non-contrast
Comparison: CT of the head performed 12/06/2012

CLINICAL DATA: Patient not feeling well, with tingling sensation
about the entirety of the body, acute onset. Initial encounter.

EXAM:
CT HEAD WITHOUT CONTRAST
TECHNIQUE: Contiguous axial images were obtained from the base of the skull
through the vertex without intravenous contrast.

[Series 2: head w/o · axial · non-contrast · 0.41mm/px · z∈[+421,+541]mm · 9 of 32 slices shown, 12 images]
[im 4/32  brain]
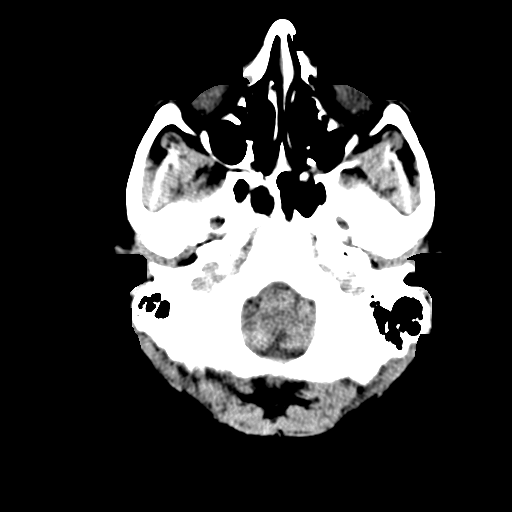
[im 4/32  bone]
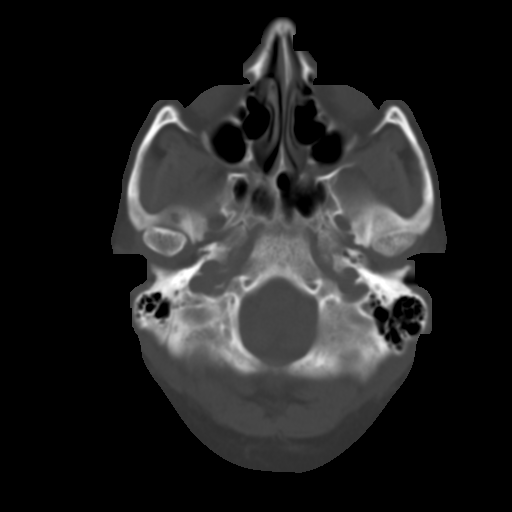
[im 7/32  brain]
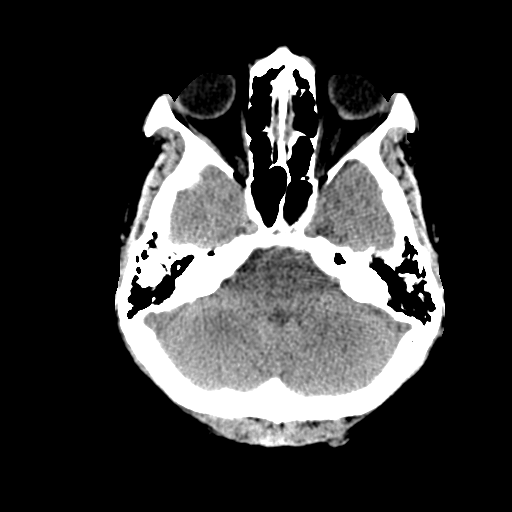
[im 10/32  brain]
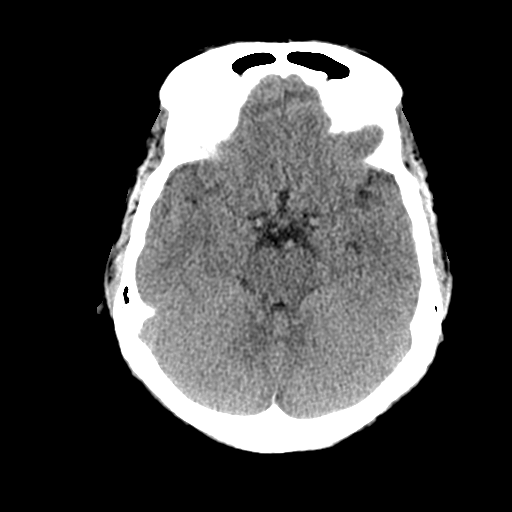
[im 13/32  brain]
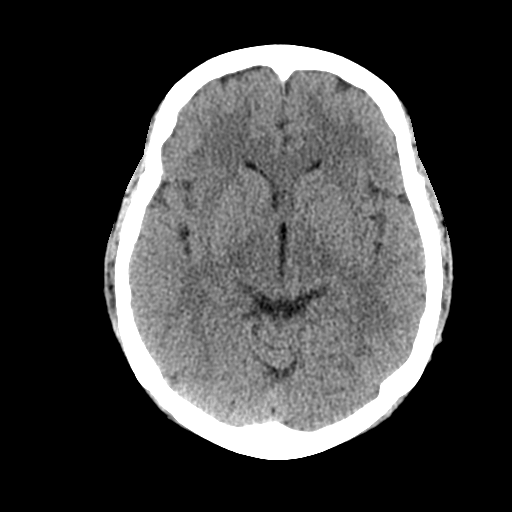
[im 16/32  brain]
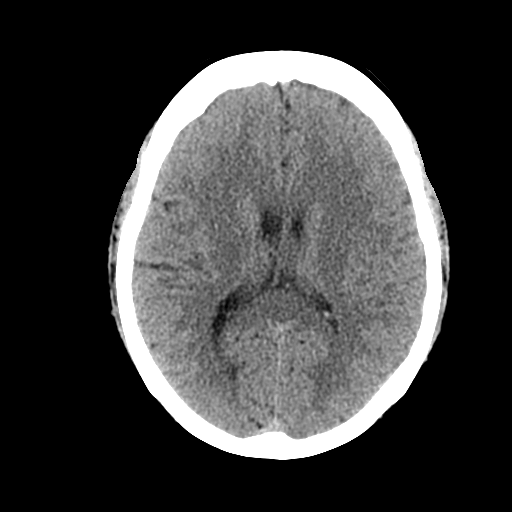
[im 16/32  bone]
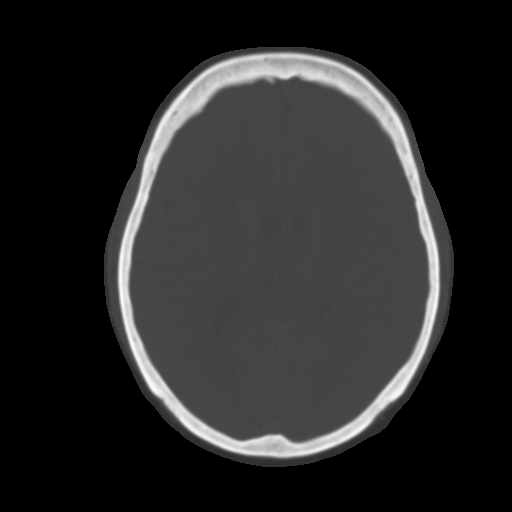
[im 19/32  brain]
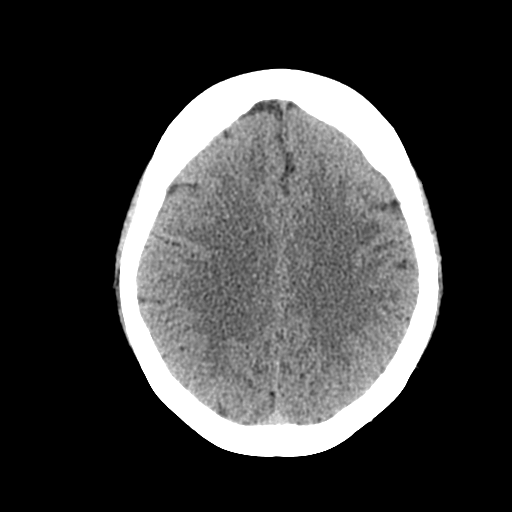
[im 22/32  brain]
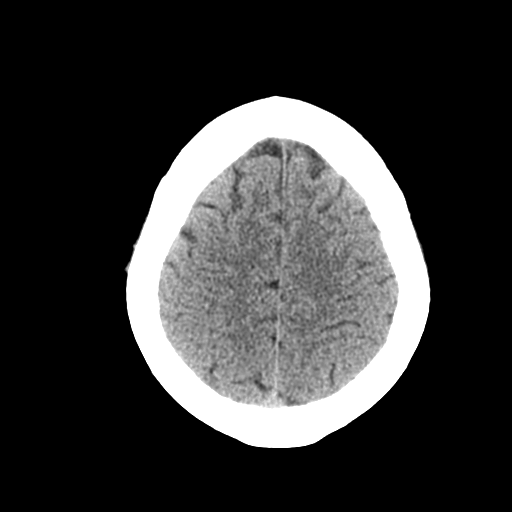
[im 25/32  brain]
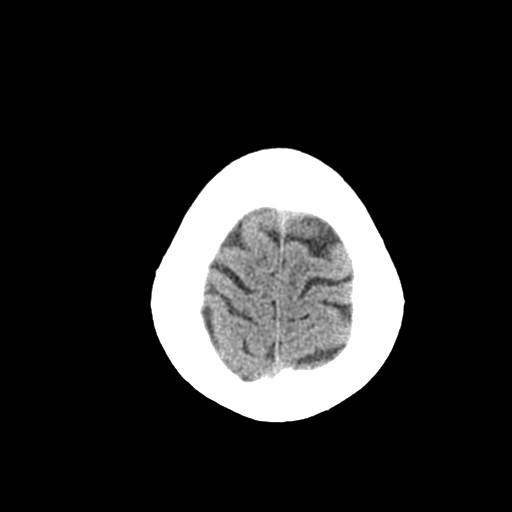
[im 28/32  brain]
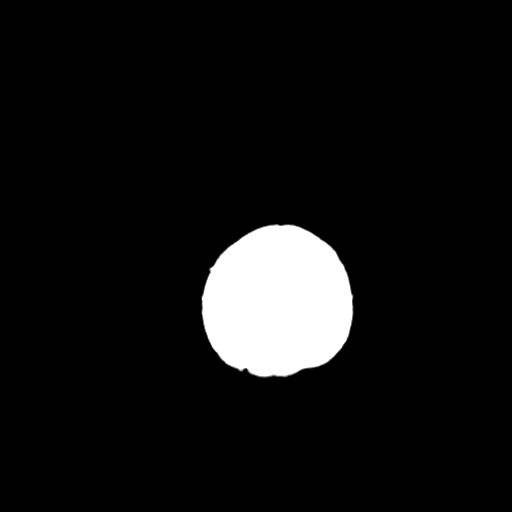
[im 28/32  bone]
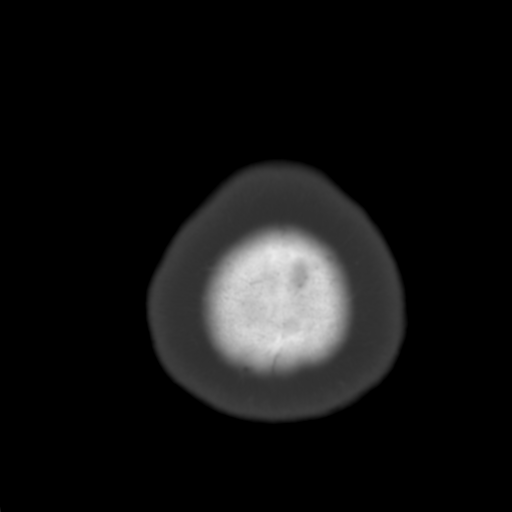

[Series 3: bone windows · axial · 0.41mm/px · z∈[+421,+526]mm · 7 of 53 slices shown]
[im 6/53  bone]
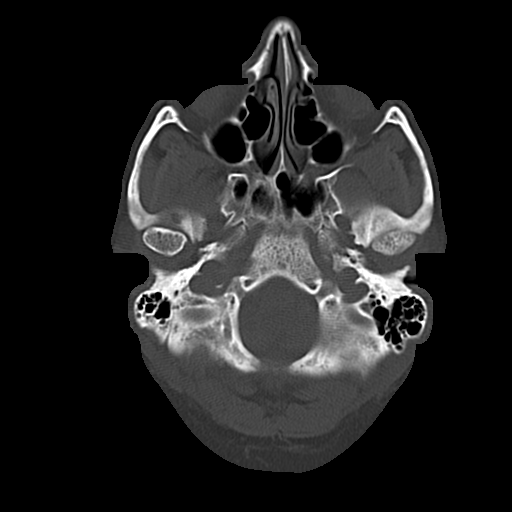
[im 12/53  bone]
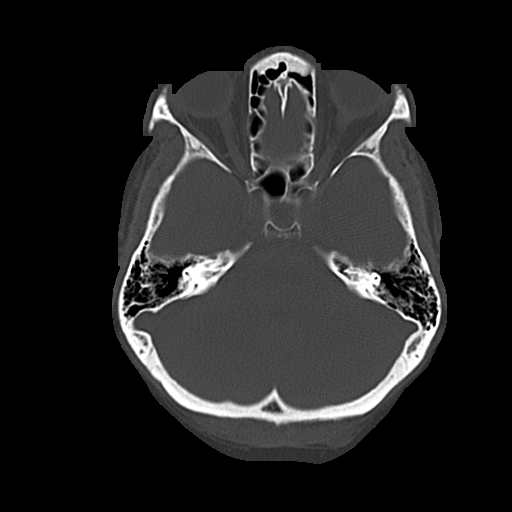
[im 18/53  bone]
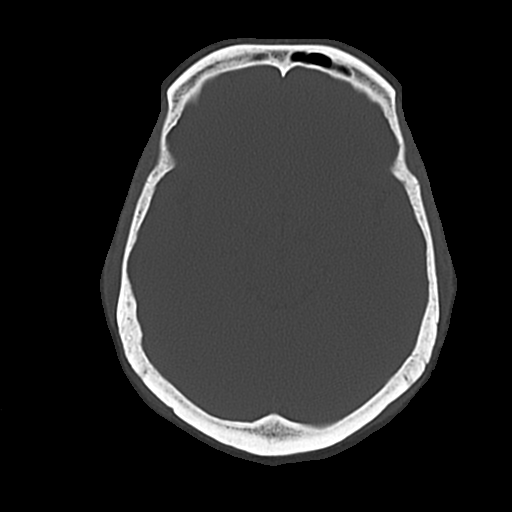
[im 24/53  bone]
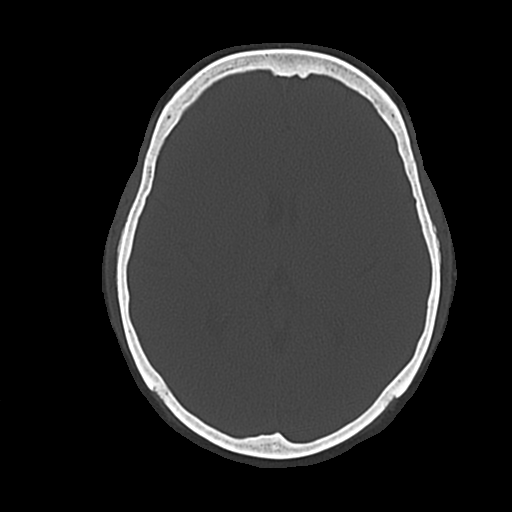
[im 29/53  bone]
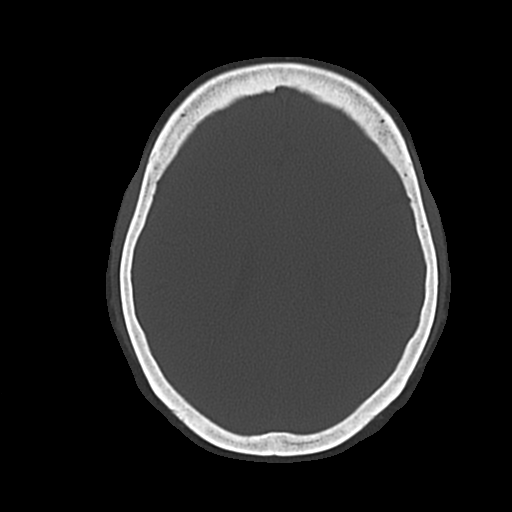
[im 35/53  bone]
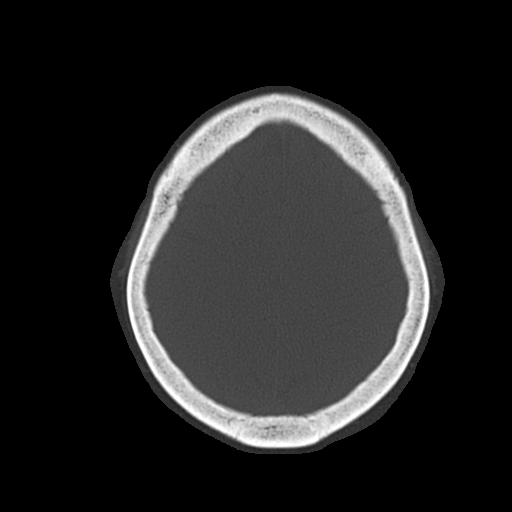
[im 41/53  bone]
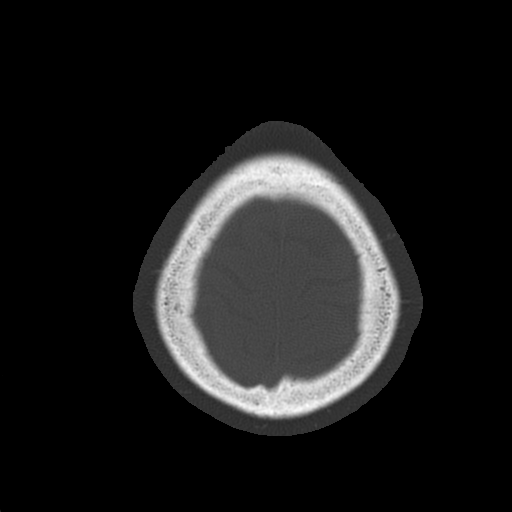

[16 of 30 positions shown; findings below may reference images not displayed]

FINDINGS: There is no evidence of acute infarction, mass lesion, or intra- or
extra-axial hemorrhage on CT.

The posterior fossa, including the cerebellum, brainstem and fourth
ventricle, is within normal limits. The third and lateral
ventricles, and basal ganglia are unremarkable in appearance. The
cerebral hemispheres are symmetric in appearance, with normal
gray-white differentiation. No mass effect or midline shift is seen.

There is no evidence of fracture; visualized osseous structures are
unremarkable in appearance. The orbits are within normal limits. The
paranasal sinuses and mastoid air cells are well-aerated. No
significant soft tissue abnormalities are seen.
IMPRESSION: Unremarkable noncontrast CT of the head.

## 2017-06-07 ENCOUNTER — Other Ambulatory Visit: Payer: Self-pay

## 2017-06-07 ENCOUNTER — Encounter (HOSPITAL_COMMUNITY): Payer: Self-pay | Admitting: Emergency Medicine

## 2017-06-07 ENCOUNTER — Emergency Department (HOSPITAL_COMMUNITY): Payer: Medicaid Other

## 2017-06-07 DIAGNOSIS — I10 Essential (primary) hypertension: Secondary | ICD-10-CM | POA: Insufficient documentation

## 2017-06-07 DIAGNOSIS — Z853 Personal history of malignant neoplasm of breast: Secondary | ICD-10-CM | POA: Insufficient documentation

## 2017-06-07 DIAGNOSIS — R0609 Other forms of dyspnea: Secondary | ICD-10-CM | POA: Insufficient documentation

## 2017-06-07 DIAGNOSIS — R221 Localized swelling, mass and lump, neck: Secondary | ICD-10-CM | POA: Insufficient documentation

## 2017-06-07 DIAGNOSIS — Z79899 Other long term (current) drug therapy: Secondary | ICD-10-CM | POA: Diagnosis not present

## 2017-06-07 DIAGNOSIS — E039 Hypothyroidism, unspecified: Secondary | ICD-10-CM | POA: Insufficient documentation

## 2017-06-07 LAB — CBC WITH DIFFERENTIAL/PLATELET
BASOS ABS: 0 10*3/uL (ref 0.0–0.1)
Basophils Relative: 0 %
EOS ABS: 0.2 10*3/uL (ref 0.0–0.7)
EOS PCT: 3 %
HCT: 39.2 % (ref 36.0–46.0)
Hemoglobin: 13 g/dL (ref 12.0–15.0)
LYMPHS PCT: 46 %
Lymphs Abs: 3.2 10*3/uL (ref 0.7–4.0)
MCH: 28.1 pg (ref 26.0–34.0)
MCHC: 33.2 g/dL (ref 30.0–36.0)
MCV: 84.7 fL (ref 78.0–100.0)
Monocytes Absolute: 0.3 10*3/uL (ref 0.1–1.0)
Monocytes Relative: 4 %
Neutro Abs: 3.3 10*3/uL (ref 1.7–7.7)
Neutrophils Relative %: 47 %
PLATELETS: 256 10*3/uL (ref 150–400)
RBC: 4.63 MIL/uL (ref 3.87–5.11)
RDW: 14.4 % (ref 11.5–15.5)
WBC: 7 10*3/uL (ref 4.0–10.5)

## 2017-06-07 LAB — COMPREHENSIVE METABOLIC PANEL
ALBUMIN: 4.2 g/dL (ref 3.5–5.0)
ALT: 23 U/L (ref 14–54)
AST: 33 U/L (ref 15–41)
Alkaline Phosphatase: 76 U/L (ref 38–126)
Anion gap: 12 (ref 5–15)
BUN: 11 mg/dL (ref 6–20)
CHLORIDE: 102 mmol/L (ref 101–111)
CO2: 24 mmol/L (ref 22–32)
CREATININE: 0.89 mg/dL (ref 0.44–1.00)
Calcium: 9 mg/dL (ref 8.9–10.3)
GFR calc Af Amer: >60 mL/min (ref >60)
GFR calc non Af Amer: >60 mL/min (ref >60)
GLUCOSE: 107 mg/dL — AB (ref 65–99)
Potassium: 3.7 mmol/L (ref 3.5–5.1)
SODIUM: 138 mmol/L (ref 135–145)
Total Bilirubin: 0.4 mg/dL (ref 0.3–1.2)
Total Protein: 8.2 g/dL — ABNORMAL HIGH (ref 6.5–8.1)

## 2017-06-07 NOTE — ED Triage Notes (Addendum)
Patient reports SOB with occasional dry cough worse with exertion onset this week , denies fever or chills . Hypertensive at triage - she has not taken her antihypertensive medication today .

## 2017-06-08 ENCOUNTER — Encounter (HOSPITAL_COMMUNITY): Payer: Self-pay | Admitting: Radiology

## 2017-06-08 ENCOUNTER — Emergency Department (HOSPITAL_COMMUNITY)
Admission: EM | Admit: 2017-06-08 | Discharge: 2017-06-08 | Disposition: A | Discharge status: Home / Self Care | Payer: Medicaid Other | Attending: Emergency Medicine | Admitting: Emergency Medicine

## 2017-06-08 ENCOUNTER — Emergency Department (HOSPITAL_COMMUNITY): Payer: Medicaid Other

## 2017-06-08 DIAGNOSIS — R0609 Other forms of dyspnea: Secondary | ICD-10-CM

## 2017-06-08 DIAGNOSIS — R221 Localized swelling, mass and lump, neck: Secondary | ICD-10-CM

## 2017-06-08 DIAGNOSIS — Z853 Personal history of malignant neoplasm of breast: Secondary | ICD-10-CM

## 2017-06-08 MED ORDER — IOPAMIDOL (ISOVUE-300) INJECTION 61%
INTRAVENOUS | Status: AC
  Administered 2017-06-08: 100 mL
  Filled 2017-06-08: qty 100

## 2017-06-08 NOTE — ED Notes (Signed)
Attempted to start IV x 1-- pt is difficult stick, with only 1 arm available due to prior mastecomy. Will place IV team consult.

## 2017-06-08 NOTE — ED Notes (Signed)
IV team at bedside at present--

## 2017-06-08 NOTE — ED Notes (Signed)
Assisted pt to restroom. Pt ambulated self efficiently. Pt returned to bedside safely with no difficulties.

## 2017-06-08 NOTE — Discharge Instructions (Signed)
1.  Call Dr. Marin Olp to schedule a recheck and review of your CT results. 2.  Follow-up with your family doctor for recheck.

## 2017-06-08 NOTE — ED Provider Notes (Signed)
[place] EMERGENCY DEPARTMENT Provider Note   CSN: [id number] Arrival date & time: 06/07/17  2155     History   Chief Complaint Chief Complaint  Patient presents with  . Shortness of Breath    Cough    HPI [name] is a 56 y.o. female.  HPI Patient reports for about a week she has been feeling shortness of breath like her "air is getting cut off".  She reports this is particularly noticeable when she raises her arms over her head to try to do her hair.  She feels a pressure into her left side of the neck.  Patient reports about 2 days ago she was having some productive cough.  First it had been brown tinged then green then mucousy.  That however has not been present now for about 2 days.  No fever no chills.  No lower extremity swelling or calf pain.  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  She reports she used to have swelling of the left arm but that improved but now she is noting worsening again over the past few months.  She has a history of a polyp resection from her vocal cords that is left her with a chronically hoarse voice.  She denies is been any change in the quality of her voice.  No associated sore throat. Past Medical History:  Diagnosis Date  . Acid reflux   . Cancer [facility])    breast cancer  . Essential hypertension 02/16/2015  . Hypertension   . Obesity 02/16/2015  . Thyroid disease   . Vertigo     Patient Active Problem List   Diagnosis Date Noted  . Essential hypertension 02/16/2015  . Obesity 02/16/2015  . Acute sinus infection 06/09/2014  . Adenocarcinoma of left breast ([place]) 10/27/2013    Past Surgical History:  Procedure Laterality Date  . ABDOMINAL HYSTERECTOMY    . ESOPHAGEAL DILATION    . MASTECTOMY      OB History    Gravida Para Term Preterm AB Living   2 2 2     2    SAB TAB Ectopic Multiple Live Births                   Home Medications    Prior to  Admission medications   Medication Sig Start Date End Date Taking? Authorizing Provider  amoxicillin-clavulanate (AUGMENTIN) 875-125 MG tablet Take 1 tablet by mouth every 12 (twelve) hours. 12/28/15   Duffy Bruce, MD  benzonatate (TESSALON) 100 MG capsule Take 1 capsule (100 mg total) by mouth every 8 (eight) hours. 08/28/15   Gloriann Loan, PA-C  fluticasone (FLONASE) 50 MCG/ACT nasal spray Place 1 spray into both nostrils daily. 12/28/15   Duffy Bruce, MD  ibuprofen (ADVIL,MOTRIN) 800 MG tablet Take 1 tablet (800 mg total) by mouth 3 (three) times daily. 08/28/15   Gloriann Loan, PA-C  levothyroxine (SYNTHROID, LEVOTHROID) 150 MCG tablet TAKE ONE TABLET BY MOUTH ONCE DAILY BEFORE BREAKFAST 06/24/15   Volanda Napoleon, MD  levothyroxine (SYNTHROID, LEVOTHROID) 150 MCG tablet TAKE ONE TABLET BY MOUTH ONCE DAILY BEFORE BREAKFAST 07/09/16   Volanda Napoleon, MD  levothyroxine (SYNTHROID, LEVOTHROID) 150 MCG tablet TAKE ONE TABLET BY MOUTH ONCE DAILY BEFORE  BREAKFAST 07/13/16   [place], Holli Humbles, NP  lisinopril-hydrochlorothiazide (PRINZIDE,ZESTORETIC) 10-12.5 MG per tablet Take 1 tablet by mouth daily. 03/01/15   Volanda Napoleon, MD  meclizine (ANTIVERT) 50 MG  tablet Take 1 tablet (50 mg total) by mouth 3 (three) times daily as needed. Patient taking differently: Take 50 mg by mouth 3 (three) times daily as needed for dizziness.  01/22/15   Pattricia Boss, MD  omeprazole (PRILOSEC) 20 MG capsule Take 1 capsule (20 mg total) by mouth daily. 09/09/15   Volanda Napoleon, MD  oxymetazoline (AFRIN NASAL SPRAY) 0.05 % nasal spray Place 1 spray into both nostrils 2 (two) times daily as needed for congestion. Do NOT use for more than 3 days. 12/28/15   Duffy Bruce, MD  PROVENTIL HFA 108 [phone] Base) MCG/ACT inhaler INHALE 2 PUFFS BY MOUTH INTO THE LUNGS EVERY 4 HOURS AS NEEDED FOR WHEEZING OR SHORTNESS OF BREATH 05/01/16   Volanda Napoleon, MD    Family History Family History  Problem Relation Age of Onset  .  Asthma Mother   . Diabetes Father   . Breast cancer Paternal Aunt     Social History Social History   Tobacco Use  . Smoking status: Never Smoker  . Smokeless tobacco: Never Used  . Tobacco comment: never used tobacco  Substance Use Topics  . Alcohol use: No  . Drug use: No     Allergies   Aspirin   Review of Systems Review of Systems 10 Systems reviewed and are negative for acute change except as noted in the HPI.   Physical Exam Updated Vital Signs BP (!) 150/95 (BP Location: Right Arm)   Pulse 66   Temp 98.2 F (36.8 C) (Oral)   Resp 14   SpO2 100%   Physical Exam  Constitutional: She is oriented to person, place, and time. She appears well-developed and well-nourished. No distress.  Patient is alert and nontoxic.  No respiratory distress.  Moderate obesity.  Clinically well in appearance.  HENT:  Head: Normocephalic and atraumatic.  Mouth/Throat: Oropharynx is clear and moist.  Posterior airway widely patent.  No tonsillar erythema or exudate.  Mucous membranes pink and moist.  Voice has hoarse quality which patient reports is baseline.  Bilateral TMs normal.  Eyes: Conjunctivae and EOM are normal. Pupils are equal, round, and reactive to light.  Neck: Neck supple.  Diffuse firmness left lateral neck that seems to correspond to the SCM but is unusually firm and enlarged.  No stridor.  Cardiovascular: Normal rate and regular rhythm.  No murmur heard. Pulmonary/Chest: Effort normal and breath sounds normal. No respiratory distress.  Abdominal: Soft. She exhibits no distension. There is no tenderness. There is no guarding.  Musculoskeletal: Normal range of motion. She exhibits no edema or tenderness.  Neurological: She is alert and oriented to person, place, and time. No cranial nerve deficit. She exhibits normal muscle tone. Coordination normal.  Skin: Skin is warm and dry.  Psychiatric: She has a normal mood and affect.  Nursing note and vitals  reviewed.    ED Treatments / Results  Labs (all labs ordered are listed, but only abnormal results are displayed) Labs Reviewed  COMPREHENSIVE METABOLIC PANEL - Abnormal; Notable for the following components:      Result Value   Glucose, Bld 107 (*)    Total Protein 8.2 (*)    All other components within normal limits  CBC WITH DIFFERENTIAL/PLATELET    EKG  EKG Interpretation None       Radiology Dg Chest 2 View  Result Date: 06/07/2017 CLINICAL DATA:  Shortness of breath with cough EXAM: CHEST  2 VIEW COMPARISON:  08/28/2015, 06/02/2014 FINDINGS: Stable appearance of  scarring in the left thorax. Surgical clips in the left axilla. No acute consolidation or pleural effusion. Stable cardiomediastinal silhouette. No pneumothorax. IMPRESSION: Stable scarring on the left. No definite acute pulmonary infiltrate is seen. Electronically Signed   By: Donavan Foil M.D.   On: 06/07/2017 22:57   Ct Soft Tissue Neck W Contrast  Result Date: 06/08/2017 CLINICAL DATA:  Neck mass. Personal history of malignancy. Shortness of breath. Difficulty breathing. Worse with arms overhead. EXAM: CT NECK WITH CONTRAST TECHNIQUE: Multidetector CT imaging of the neck was performed using the standard protocol following the bolus administration of intravenous contrast. CONTRAST:  156mL ISOVUE-300 IOPAMIDOL (ISOVUE-300) INJECTION 61% COMPARISON:  CT neck with contrast 08/29/2013 FINDINGS: Pharynx and larynx: There is mild prominence of the palatine tonsils bilaterally. There is some prominence of the lingual tonsils as well. No discrete mass lesion is present. Epiglottis is within normal limits. The hypopharynx is unremarkable. 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 The right vocal cord is within normal limits. Salivary glands: The submandibular and parotid glands are normal bilaterally. Thyroid: A dominant heterogeneous left thyroid mass demonstrates slight interval increase in size, now measuring 4.2 x 3.5 x  3.8 cm. This compares with previous measurements of 3.9 x 3.5 x 3.7 cm. Lymph nodes: Borderline left supraclavicular and level 3 lymph nodes are stable. These nodes all measure less than 1 cm in short access. No significant cervical adenopathy is present. There are no significant right-sided nodes. Vascular: Negative. Limited intracranial: Unremarkable. Visualized orbits: The visualized orbits are within normal limits. Mastoids and visualized paranasal sinuses: Visualized paranasal sinuses and mastoid air cells are clear. Skeleton: Mild endplate degenerative changes and uncovertebral spurring are most pronounced at C5-6 and C6-7. Upper chest: The lung apices are clear. IMPRESSION: 1. Stable slight increase in size of heterogeneous left thyroid mass now measuring 4.2 x 3.5 x 3.8 cm. Given the stability over time, malignancy is considered less likely. 2. Stable left vocal cord paralysis. This may be secondary to the left thyroid mass. 3. Stable subcentimeter left supraclavicular and level 3 lymph nodes. Electronically Signed   By: San Morelle M.D.   On: 06/08/2017 12:51   Ct Chest W Contrast  Result Date: 06/08/2017 CLINICAL DATA:  Shortness of breath with dry cough. Worse over the past week. EXAM: CT CHEST WITH CONTRAST TECHNIQUE: Multidetector CT imaging of the chest was performed during intravenous contrast administration. CONTRAST:  168mL ISOVUE-300 IOPAMIDOL (ISOVUE-300) INJECTION 61% COMPARISON:  Chest radiograph 06/07/2017. No prior chest CT. Today's neck CT dictated separately. A chest MRI of 08/28/2013 is reviewed. FINDINGS: Cardiovascular: Bovine arch. Tortuous thoracic aorta. Mild cardiomegaly, without pericardial effusion. Lad coronary artery atherosclerosis, including on image 69/series 3. No central pulmonary embolism, on this non-dedicated study. Mediastinum/Nodes: Left-sided thyroid enlargement with a dominant 3.8 x 3.0 cm mass which extends into the upper chest.This measured 3.7 x 3.2 cm  back in 2015. Small adjacent low left jugular nodes including at 6 mm on image 3/series 3. Left axillary node dissection. No mediastinal or hilar adenopathy. Favor residual thymic tissue in the anterior mediastinum, including on image 49/series 3. Adjacent surgical clips in the prevascular space. No internal mammary adenopathy. Lungs/Pleura: No pleural fluid. Tracheal deviation to the right secondary to left-sided thyroid enlargement. Left base scarring. Upper Abdomen: Moderate hepatic steatosis. Normal imaged portions of the spleen, stomach, pancreas, adrenal glands, kidneys. Musculoskeletal: Left mastectomy. Eighth left rib defect is likely postoperative. IMPRESSION: 1.  No acute process in the chest. 2. Age advanced coronary  artery atherosclerosis. Recommend assessment of coronary risk factors and consideration of medical therapy. 3. Dominant left-sided thyroid mass is grossly similar in size back to 2015 and was biopsied on 09/15/2013. Electronically Signed   By: Abigail Miyamoto M.D.   On: 06/08/2017 12:46    Procedures Procedures (including critical care time)  Medications Ordered in ED Medications  iopamidol (ISOVUE-300) 61 % injection (100 mLs  Contrast Given 06/08/17 1157)     Initial Impression / Assessment and Plan / ED Course  I have reviewed the triage vital signs and the nursing notes.  Pertinent labs & imaging results that were available during my care of the patient were reviewed by me and considered in my medical decision making (see chart for details).  Clinical Course as of Jun 08 1358  Sat Jun 08, 2017  0826 Hemoglobin: 13.0 [MP]    Clinical Course User Index [MP] Charlesetta Shanks, MD     Final Clinical Impressions(s) / ED Diagnoses   Final diagnoses:  Other form of dyspnea  Neck mass  History of breast cancer   Patient presents with a feeling of her air getting "cut off" when she puts her arms over her head.  She does have palpable mass on the left side of her neck.   I had concern for possible malignancy that was recurrent or metastatic.  CT scan of the neck and chest do not show likely new disease.  She has a stable appearing mass in the left neck thought to be less likely malignant based on stability over the past 3 years.  Patient is aware nonetheless she needs to follow-up with Dr. Marin Olp to review these findings and make sure he has no other concerns regarding her history of malignancy.  Patient is otherwise stable and clinically well in appearance.  I have low suspicion for cardiac ischemic etiology.  She is also advised to follow-up with her PCP. ED Discharge Orders    None       Charlesetta Shanks, MD 06/08/17 [phone]

## 2017-06-08 NOTE — ED Notes (Signed)
Pt states that "when I lift my arms up over my head it cuts my breath off" pt has hx of hoarse sounding voice -- does state she had a cough productive for brownish sputum.

## 2017-06-08 NOTE — ED Notes (Signed)
Pt verbalized understanding discharge instructions and denies any further needs or questions at this time. VS stable, ambulatory and steady gait.   

## 2017-06-08 NOTE — ED Notes (Signed)
Walked patient to the bathroom  

## 2017-06-08 NOTE — ED Notes (Signed)
Hooked patient up to the monitor patient is resting with family at bedside and call bell in reach

## 2017-07-02 ENCOUNTER — Other Ambulatory Visit: Payer: Self-pay | Admitting: *Deleted

## 2017-07-02 DIAGNOSIS — C50912 Malignant neoplasm of unspecified site of left female breast: Secondary | ICD-10-CM

## 2017-07-03 ENCOUNTER — Encounter: Payer: Self-pay | Admitting: Hematology & Oncology

## 2017-07-03 ENCOUNTER — Other Ambulatory Visit: Payer: Self-pay

## 2017-07-03 ENCOUNTER — Inpatient Hospital Stay (HOSPITAL_BASED_OUTPATIENT_CLINIC_OR_DEPARTMENT_OTHER): Payer: Medicaid Other | Admitting: Hematology & Oncology

## 2017-07-03 ENCOUNTER — Inpatient Hospital Stay: Payer: Medicaid Other | Attending: Hematology & Oncology

## 2017-07-03 VITALS — BP 144/91 | HR 98 | Temp 98.2°F | Resp 18 | Wt 215.4 lb

## 2017-07-03 DIAGNOSIS — C50912 Malignant neoplasm of unspecified site of left female breast: Secondary | ICD-10-CM

## 2017-07-03 DIAGNOSIS — J45901 Unspecified asthma with (acute) exacerbation: Secondary | ICD-10-CM

## 2017-07-03 DIAGNOSIS — E032 Hypothyroidism due to medicaments and other exogenous substances: Secondary | ICD-10-CM

## 2017-07-03 DIAGNOSIS — Z780 Asymptomatic menopausal state: Secondary | ICD-10-CM | POA: Insufficient documentation

## 2017-07-03 DIAGNOSIS — M818 Other osteoporosis without current pathological fracture: Secondary | ICD-10-CM

## 2017-07-03 DIAGNOSIS — Z923 Personal history of irradiation: Secondary | ICD-10-CM

## 2017-07-03 DIAGNOSIS — Z853 Personal history of malignant neoplasm of breast: Secondary | ICD-10-CM

## 2017-07-03 DIAGNOSIS — I890 Lymphedema, not elsewhere classified: Secondary | ICD-10-CM | POA: Insufficient documentation

## 2017-07-03 LAB — CBC WITH DIFFERENTIAL (CANCER CENTER ONLY)
BASOS ABS: 0 10*3/uL (ref 0.0–0.1)
Basophils Relative: 0 %
Eosinophils Absolute: 0.1 10*3/uL (ref 0.0–0.5)
Eosinophils Relative: 2 %
HCT: 37.7 % (ref 34.8–46.6)
Hemoglobin: 12.6 g/dL (ref 11.6–15.9)
LYMPHS PCT: 34 %
Lymphs Abs: 2 10*3/uL (ref 0.9–3.3)
MCH: 28.7 pg (ref 26.0–34.0)
MCHC: 33.4 g/dL (ref 32.0–36.0)
MCV: 85.9 fL (ref 81.0–101.0)
MONO ABS: 0.3 10*3/uL (ref 0.1–0.9)
Monocytes Relative: 4 %
Neutro Abs: 3.5 10*3/uL (ref 1.5–6.5)
Neutrophils Relative %: 60 %
Platelet Count: 219 10*3/uL (ref 145–400)
RBC: 4.39 MIL/uL (ref 3.70–5.32)
RDW: 14 % (ref 11.1–15.7)
WBC: 5.8 10*3/uL (ref 3.9–10.3)

## 2017-07-03 LAB — CMP (CANCER CENTER ONLY)
ALT: 19 U/L (ref 0–55)
AST: 18 U/L (ref 5–34)
Albumin: 4.1 g/dL (ref 3.5–5.0)
Alkaline Phosphatase: 81 U/L (ref 40–150)
Anion gap: 11 (ref 3–11)
BUN: 14 mg/dL (ref 7–26)
CO2: 23 mmol/L (ref 22–29)
CREATININE: 1 mg/dL (ref 0.60–1.10)
Calcium: 9.2 mg/dL (ref 8.4–10.4)
Chloride: 105 mmol/L (ref 98–109)
GFR, Est AFR Am: >60 mL/min (ref >60)
Glucose, Bld: 121 mg/dL (ref 70–140)
Potassium: 3.7 mmol/L (ref 3.3–4.7)
Sodium: 139 mmol/L (ref 136–145)
TOTAL PROTEIN: 7.9 g/dL (ref 6.4–8.3)

## 2017-07-03 MED ORDER — ALBUTEROL SULFATE HFA 108 (90 BASE) MCG/ACT IN AERS: INHALATION_SPRAY | RESPIRATORY_TRACT | 3 refills | Status: DC

## 2017-07-03 NOTE — Progress Notes (Signed)
Hematology and Oncology Follow Up Visit  [name] [id number] 1960-12-18 57 y.o. 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  We last saw her 2 and half years ago.  [name] has had problems with lack of insurance.  I think [name] is on Medicaid.  Her last mammogram was back in December 2016.  [name] was seen in the emergency room recently with some breathing problems.  [name] had a CT scan done of the neck and chest.  Nothing looked active in the chest.  [name] had the left-sided thyroid mass which was similar in size compared to 2015.  [name] needs some physical therapy.  [name] is having some more lymphedema in the left arm.  [name] is not able to afford a lymphedema sleeve.  Apparently Medicaid will pay for it.  Her appetite is good.  Her weight is about the same.  [name] does have underlying asthma.  [name] does have an inhaler.  There is been no nausea or vomiting.  [name] is had no change in bowel or bladder habits.  Overall, her performance status is ECOG 1.   Medications:  Current Outpatient Medications:  .  albuterol (PROVENTIL HFA) 108 (90 Base) MCG/ACT inhaler, INHALE 2 PUFFS BY MOUTH INTO THE LUNGS EVERY 4 HOURS AS NEEDED FOR WHEEZING OR SHORTNESS OF BREATH, Disp: 6.7 g, Rfl: 3 .  amoxicillin-clavulanate (AUGMENTIN) 875-125 MG tablet, Take 1 tablet by mouth every 12 (twelve) hours., Disp: 20 tablet, Rfl: 0 .  levothyroxine (SYNTHROID, LEVOTHROID) 150 MCG tablet, TAKE ONE TABLET BY MOUTH ONCE DAILY BEFORE BREAKFAST, Disp: 30 tablet, Rfl: 0 .  lisinopril-hydrochlorothiazide (PRINZIDE,ZESTORETIC) 10-12.5 MG per tablet, Take 1 tablet by mouth daily., Disp: 30 tablet, Rfl: 2 .  omeprazole (PRILOSEC) 20 MG capsule, Take 1 capsule (20 mg total) by mouth daily., Disp: 30 capsule, Rfl: 6 .   oxymetazoline (AFRIN NASAL SPRAY) 0.05 % nasal spray, Place 1 spray into both nostrils 2 (two) times daily as needed for congestion. Do NOT use for more than 3 days., Disp: 30 mL, Rfl: 0  Allergies:  Allergies  Allergen Reactions  . Aspirin Swelling    Past Medical History, Surgical history, Social history, and Family History were reviewed and updated.  Review of Systems: Review of Systems  Constitutional: Negative.   HENT:  Negative.   Eyes: Negative.   Respiratory: Positive for shortness of breath.   Cardiovascular: Negative.   Gastrointestinal: Negative.   Endocrine: Negative.   Genitourinary: Negative.    Musculoskeletal: Positive for myalgias.  Skin: Negative.   Neurological: Negative.   Hematological: Negative.   Psychiatric/Behavioral: Negative.     Physical Exam:  weight is 215 lb 6.4 oz (97.7 kg). Her oral temperature is 98.2 F (36.8 C). Her blood pressure is 144/91 (abnormal) and her pulse is 98. Her respiration is 18 and oxygen saturation is 100%.   Wt Readings from Last 3 Encounters:  07/03/17 215 lb 6.4 oz (97.7 kg)  07/17/16 205 lb (93 kg)  12/28/15 205 lb (93 kg)    Physical Exam  Constitutional: [name] is oriented to person, place, and time.  HENT:  Head: Normocephalic and atraumatic.  Mouth/Throat: Oropharynx is clear and moist.  Eyes: EOM are normal. Pupils are equal, round, and reactive to light.  Neck: Normal range of motion.  Cardiovascular: Normal rate, regular rhythm and normal heart sounds.  Pulmonary/Chest: Effort normal and breath sounds normal.  Abdominal: Soft. Bowel sounds are normal.  Musculoskeletal: Normal range of motion. [name] exhibits no edema, tenderness or deformity.  Lymphadenopathy:    [name] has no cervical adenopathy.  Neurological: [name] is alert and oriented to person, place, and time.  Skin: Skin is warm and dry. No rash noted. No erythema.  Psychiatric: [name] has a normal mood and affect. Her behavior is normal. Judgment and  thought content normal.  Vitals reviewed.    Lab Results  Component Value Date   WBC 5.8 07/03/2017   HGB 13.0 06/07/2017   HCT 37.7 07/03/2017   MCV 85.9 07/03/2017   PLT 219 07/03/2017     Chemistry      Component Value Date/Time   NA 139 07/03/2017 1404   NA 139 12/15/2014 0834   K 3.7 07/03/2017 1404   K 4.1 12/15/2014 0834   CL 105 07/03/2017 1404   CL 101 10/28/2013 1150   CO2 23 07/03/2017 1404   CO2 22 12/15/2014 0834   BUN 14 07/03/2017 1404   BUN 13.0 12/15/2014 0834   CREATININE 0.89 06/07/2017 2220   CREATININE 0.8 12/15/2014 0834      Component Value Date/Time   CALCIUM 9.2 07/03/2017 1404   CALCIUM 9.0 12/15/2014 0834   ALKPHOS 81 07/03/2017 1404   ALKPHOS 81 12/15/2014 0834   AST 18 07/03/2017 1404   AST 16 12/15/2014 0834   ALT 19 07/03/2017 1404   ALT 13 12/15/2014 0834   BILITOT <0.2 (L) 07/03/2017 1404   BILITOT 0.31 12/15/2014 0834         Impression and Plan: [name] is a 57 year old postmenopausal African-American female.  [name] has had a remote history of recurrent adenocarcinoma of the left breast.  [name] underwent radiation for this recurrence after surgery.  There is absolutely no evidence of recurrent breast cancer from my point of view.  [name] had a recent CT scan done.  We will see about getting her back in 6 months.  I will try to get the lymphedema sleeve for her.  We will see about physical therapy for the left arm lymphedema.     Lauren Napoleon, MD 1/23/20195:05 PM

## 2017-07-04 ENCOUNTER — Other Ambulatory Visit: Payer: Self-pay | Admitting: Hematology & Oncology

## 2017-07-04 ENCOUNTER — Other Ambulatory Visit: Payer: Self-pay | Admitting: *Deleted

## 2017-07-04 DIAGNOSIS — E032 Hypothyroidism due to medicaments and other exogenous substances: Secondary | ICD-10-CM

## 2017-07-04 LAB — TSH: TSH: 42.838 u[IU]/mL — ABNORMAL HIGH (ref 0.308–3.960)

## 2017-07-04 MED ORDER — UNABLE TO FIND: 0 refills | Status: DC

## 2017-07-06 ENCOUNTER — Encounter (HOSPITAL_COMMUNITY): Payer: Self-pay | Admitting: Emergency Medicine

## 2017-07-06 ENCOUNTER — Emergency Department (HOSPITAL_COMMUNITY)
Admission: EM | Admit: 2017-07-06 | Discharge: 2017-07-06 | Disposition: A | Discharge status: Home / Self Care | Payer: Medicaid Other | Attending: Physician Assistant | Admitting: Physician Assistant

## 2017-07-06 ENCOUNTER — Emergency Department (HOSPITAL_COMMUNITY): Payer: Medicaid Other

## 2017-07-06 DIAGNOSIS — Z79899 Other long term (current) drug therapy: Secondary | ICD-10-CM | POA: Insufficient documentation

## 2017-07-06 DIAGNOSIS — M6281 Muscle weakness (generalized): Secondary | ICD-10-CM | POA: Diagnosis not present

## 2017-07-06 DIAGNOSIS — R1011 Right upper quadrant pain: Secondary | ICD-10-CM | POA: Insufficient documentation

## 2017-07-06 DIAGNOSIS — R1012 Left upper quadrant pain: Secondary | ICD-10-CM | POA: Diagnosis not present

## 2017-07-06 DIAGNOSIS — I10 Essential (primary) hypertension: Secondary | ICD-10-CM | POA: Diagnosis not present

## 2017-07-06 DIAGNOSIS — R110 Nausea: Secondary | ICD-10-CM | POA: Diagnosis not present

## 2017-07-06 DIAGNOSIS — E039 Hypothyroidism, unspecified: Secondary | ICD-10-CM | POA: Insufficient documentation

## 2017-07-06 DIAGNOSIS — Z853 Personal history of malignant neoplasm of breast: Secondary | ICD-10-CM | POA: Diagnosis not present

## 2017-07-06 DIAGNOSIS — R6889 Other general symptoms and signs: Secondary | ICD-10-CM

## 2017-07-06 LAB — URINALYSIS, ROUTINE W REFLEX MICROSCOPIC
BACTERIA UA: NONE SEEN
BILIRUBIN URINE: NEGATIVE
GLUCOSE, UA: NEGATIVE mg/dL
KETONES UR: NEGATIVE mg/dL
LEUKOCYTES UA: NEGATIVE
NITRITE: NEGATIVE
PH: 8 (ref 5.0–8.0)
Protein, ur: NEGATIVE mg/dL
Specific Gravity, Urine: 1.006 (ref 1.005–1.030)

## 2017-07-06 LAB — CBC
HCT: 38.7 % (ref 36.0–46.0)
HEMOGLOBIN: 12.8 g/dL (ref 12.0–15.0)
MCH: 28.4 pg (ref 26.0–34.0)
MCHC: 33.1 g/dL (ref 30.0–36.0)
MCV: 85.8 fL (ref 78.0–100.0)
Platelets: 235 10*3/uL (ref 150–400)
RBC: 4.51 MIL/uL (ref 3.87–5.11)
RDW: 14 % (ref 11.5–15.5)
WBC: 6.6 10*3/uL (ref 4.0–10.5)

## 2017-07-06 LAB — COMPREHENSIVE METABOLIC PANEL
ALK PHOS: 82 U/L (ref 38–126)
ALT: 20 U/L (ref 14–54)
AST: 24 U/L (ref 15–41)
Albumin: 4.3 g/dL (ref 3.5–5.0)
Anion gap: 12 (ref 5–15)
BILIRUBIN TOTAL: 0.3 mg/dL (ref 0.3–1.2)
BUN: 15 mg/dL (ref 6–20)
CALCIUM: 9.1 mg/dL (ref 8.9–10.3)
CO2: 23 mmol/L (ref 22–32)
CREATININE: 1.04 mg/dL — AB (ref 0.44–1.00)
Chloride: 101 mmol/L (ref 101–111)
GFR, EST NON AFRICAN AMERICAN: 59 mL/min — AB (ref >60)
Glucose, Bld: 106 mg/dL — ABNORMAL HIGH (ref 65–99)
Potassium: 4 mmol/L (ref 3.5–5.1)
Sodium: 136 mmol/L (ref 135–145)
TOTAL PROTEIN: 7.7 g/dL (ref 6.5–8.1)

## 2017-07-06 LAB — I-STAT TROPONIN, ED
TROPONIN I, POC: 0 ng/mL (ref 0.00–0.08)
Troponin i, poc: 0 ng/mL (ref 0.00–0.08)

## 2017-07-06 LAB — LIPASE, BLOOD: Lipase: 35 U/L (ref 11–51)

## 2017-07-06 NOTE — ED Triage Notes (Signed)
Pt presents to ED for assessment after stating this morning she began to have a "weird" generalized body "feeling" where she feels heavy.  She states when she goes to stand she feels nauseas and heavy/weak.  No neuro deficits on assessment

## 2017-07-06 NOTE — Discharge Instructions (Signed)
Please follow up with your primary care doctor.  If your symptoms worsen or you have any concerns please seek additional medical care.

## 2017-07-06 NOTE — ED Notes (Signed)
ED Provider at bedside. 

## 2017-07-06 NOTE — ED Provider Notes (Signed)
[place] EMERGENCY DEPARTMENT Provider Note   CSN: [id number] Arrival date & time: 07/06/17  1422     History   Chief Complaint Chief Complaint  Patient presents with  . Nausea  . Weakness    HPI [name] is a 57 y.o. female who presents today for evaluation of abnormal sensations.  She reports that around noon she started to have a "weird" feeling where she felt like her body was heavy, she felt weak, but she states that there were other feelings that she cannot describe.  She reports that when she attempted to stand up she felt nauseous and like she was going to pass out. She denies headache, chest pain, or shortness of breath.  She reports bilateral upper quadrant abdominal pain and nausea with out vomiting.  She reports that prior to noon she felt fine.  Denies any fevers or chills, no sick contacts.  She reports her symptoms are worse with standing, made better by sitting down.  She has never had anything like this before.    HPI  Past Medical History:  Diagnosis Date  . Acid reflux   . Cancer [facility])    breast cancer  . Essential hypertension 02/16/2015  . Hypertension   . Obesity 02/16/2015  . Thyroid disease   . Vertigo     Patient Active Problem List   Diagnosis Date Noted  . Essential hypertension 02/16/2015  . Obesity 02/16/2015  . Acute sinus infection 06/09/2014  . Adenocarcinoma of left breast ([place]) 10/27/2013    Past Surgical History:  Procedure Laterality Date  . ABDOMINAL HYSTERECTOMY    . ESOPHAGEAL DILATION    . MASTECTOMY      OB History    Gravida Para Term Preterm AB Living   2 2 2     2    SAB TAB Ectopic Multiple Live Births                   Home Medications    Prior to Admission medications   Medication Sig Start Date End Date Taking? Authorizing Provider  albuterol (PROVENTIL HFA) 108 (90 Base) MCG/ACT inhaler INHALE 2 PUFFS BY MOUTH INTO THE LUNGS EVERY 4 HOURS AS NEEDED FOR WHEEZING OR SHORTNESS OF  BREATH 07/03/17  Yes Ennever, Rudell Cobb, MD  amoxicillin (AMOXIL) 500 MG capsule Take 500 mg by mouth 2 (two) times daily.   Yes [provider]  levothyroxine (SYNTHROID, LEVOTHROID) 150 MCG tablet TAKE ONE TABLET BY MOUTH ONCE DAILY BEFORE BREAKFAST Patient taking differently: TAKE 150 mcg TABLET BY MOUTH ONCE DAILY BEFORE BREAKFAST 06/24/15  Yes Ennever, Rudell Cobb, MD  lisinopril-hydrochlorothiazide (PRINZIDE,ZESTORETIC) 10-12.5 MG per tablet Take 1 tablet by mouth daily. 03/01/15  Yes Volanda Napoleon, MD  omeprazole (PRILOSEC) 20 MG capsule Take 1 capsule (20 mg total) by mouth daily. 09/09/15  Yes Volanda Napoleon, MD  UNABLE TO FIND Lymphedema sleeve for L arm 07/04/17  Yes Ennever, Rudell Cobb, MD  amoxicillin-clavulanate (AUGMENTIN) 875-125 MG tablet Take 1 tablet by mouth every 12 (twelve) hours. Patient not taking: Reported on 07/06/2017 12/28/15   Duffy Bruce, MD  oxymetazoline A[place]a NASAL SPRAY) 0.05 % nasal spray Place 1 spray into both nostrils 2 (two) times daily as needed for congestion. Do NOT use for more than 3 days. Patient not taking: Reported on 07/06/2017 12/28/15   Duffy Bruce, MD    Family History Family History  Problem Relation Age of Onset  . Asthma Mother   .  Diabetes Father   . Breast cancer Paternal Aunt     Social History Social History   Tobacco Use  . Smoking status: Never Smoker  . Smokeless tobacco: Never Used  . Tobacco comment: never used tobacco  Substance Use Topics  . Alcohol use: No  . Drug use: No     Allergies   Aspirin   Review of Systems Review of Systems  Constitutional: Negative for chills and fever.  HENT: Positive for facial swelling (Left sided face. ). Negative for ear discharge, ear pain, sore throat, trouble swallowing and voice change.   Eyes: Negative for photophobia, pain and visual disturbance.  Respiratory: Negative for cough and shortness of breath.   Cardiovascular: Negative for chest pain and palpitations.    Gastrointestinal: Positive for abdominal pain and nausea. Negative for blood in stool, constipation and vomiting.  Genitourinary: Negative for dysuria.  Musculoskeletal: Negative for arthralgias.  Skin: Negative for rash.  Neurological: Positive for weakness. Negative for dizziness, light-headedness and headaches.  All other systems reviewed and are negative.    Physical Exam Updated Vital Signs BP 114/76   Pulse 67   Temp 99 F (37.2 C) (Oral)   Resp 14   SpO2 100%   Physical Exam  Constitutional: She appears well-developed and well-nourished. No distress.  HENT:  Head: Normocephalic and atraumatic.  Mouth/Throat: Oropharynx is clear and moist.  No obvious facial swelling  Eyes: Conjunctivae are normal.  Neck: Neck supple.  Cardiovascular: Normal rate, regular rhythm and intact distal pulses.  No murmur heard. Pulmonary/Chest: Effort normal and breath sounds normal. No respiratory distress.  Abdominal: Soft. There is tenderness in the right upper quadrant, epigastric area and left upper quadrant.  Very mild bilateral upper quadrant TTP.    Musculoskeletal: She exhibits no edema.  Neurological: She is alert.  Mental Status:  Alert, oriented, thought content appropriate, able to give a coherent history. Speech fluent without evidence of aphasia. Able to follow 2 step commands without difficulty.  Cranial Nerves:  II:  Peripheral visual fields grossly normal, pupils equal, round, reactive to light III,IV, VI: ptosis not present, extra-ocular motions intact bilaterally  V,VII: smile symmetric  VIII: hearing grossly normal to voice  X: uvula elevates symmetrically  XI: bilateral shoulder shrug symmetric and strong XII: midline tongue extension without fassiculations Motor:  Normal tone. 5/5 in upper and lower extremities bilaterally including strong and equal grip strength and dorsiflexion/plantar flexion Cerebellar: normal finger-to-nose with bilateral upper  extremities Gait: normal gait and balance CV: distal pulses palpable throughout    Skin: Skin is warm and dry.  Psychiatric: She has a normal mood and affect.  Nursing note and vitals reviewed.    ED Treatments / Results  Labs (all labs ordered are listed, but only abnormal results are displayed) Labs Reviewed  COMPREHENSIVE METABOLIC PANEL - Abnormal; Notable for the following components:      Result Value   Glucose, Bld 106 (*)    Creatinine, Ser 1.04 (*)    GFR calc non Af Amer 59 (*)    All other components within normal limits  URINALYSIS, ROUTINE W REFLEX MICROSCOPIC - Abnormal; Notable for the following components:   Color, Urine COLORLESS (*)    Hgb urine dipstick SMALL (*)    Squamous Epithelial / LPF 0-5 (*)    All other components within normal limits  CBC  LIPASE, BLOOD  I-STAT TROPONIN, ED  I-STAT TROPONIN, ED    EKG  EKG Interpretation None  Radiology Ct Head Wo Contrast  Result Date: 07/06/2017 CLINICAL DATA:  Facial paresthesias and weakness. EXAM: CT HEAD WITHOUT CONTRAST TECHNIQUE: Contiguous axial images were obtained from the base of the skull through the vertex without intravenous contrast. COMPARISON:  02/07/2015 FINDINGS: Brain: No evidence of acute infarction, hemorrhage, hydrocephalus, extra-axial collection, or mass lesion/mass effect. Vascular:  No hyperdense vessel or other acute findings. Skull: No evidence of fracture or other significant bone abnormality. Sinuses/Orbits:  No acute findings. Other: None. IMPRESSION: Negative noncontrast head CT. Electronically Signed   By: Earle Gell M.D.   On: 07/06/2017 19:09    Procedures Procedures (including critical care time)  Medications Ordered in ED Medications - No data to display   Initial Impression / Assessment and Plan / ED Course  I have reviewed the triage vital signs and the nursing notes.  Pertinent labs & imaging results that were available during my care of the patient were  reviewed by me and considered in my medical decision making (see chart for details).    [name] presents today for evaluation of feeling generally weak and like her body was heavy that resolved PTA.  CT head normal.  CBC with out anemia, no elevated WBC.  Cr mildly elevated.  Patient did report diffuse upper quadrant abdominal pain but reports she is not concerned by it, and does not feel like it needs CT or additional evaluation and stated her understanding of the risks.  EKG obtained, troponin normal x2.  Given that she has not had the sensation of feeling like her body was heavy while here in the ED and it appears to have been temporary additional work up not necessary.  Neuro intact.   Patient given return precautions and stated her understanding.  PCP follow up.     Final Clinical Impressions(s) / ED Diagnoses   Final diagnoses:  Feeling unwell    ED Discharge Orders    None       Lorin Glass, PA-C 07/07/17 0051    Macarthur Critchley, MD 07/10/17 [phone]

## 2017-07-06 NOTE — ED Notes (Signed)
Patient transported to CT 

## 2017-07-09 ENCOUNTER — Ambulatory Visit
Admission: RE | Admit: 2017-07-09 | Discharge: 2017-07-09 | Disposition: A | Discharge status: Home / Self Care | Payer: Medicaid Other | Source: Ambulatory Visit | Attending: Hematology & Oncology | Admitting: Hematology & Oncology

## 2017-07-09 DIAGNOSIS — C50912 Malignant neoplasm of unspecified site of left female breast: Secondary | ICD-10-CM

## 2017-07-18 ENCOUNTER — Other Ambulatory Visit: Payer: Self-pay

## 2017-07-18 ENCOUNTER — Ambulatory Visit: Payer: Medicaid Other | Attending: Hematology & Oncology | Admitting: Physical Therapy

## 2017-07-18 ENCOUNTER — Encounter: Payer: Self-pay | Admitting: Physical Therapy

## 2017-07-18 DIAGNOSIS — I972 Postmastectomy lymphedema syndrome: Secondary | ICD-10-CM | POA: Insufficient documentation

## 2017-07-18 DIAGNOSIS — R293 Abnormal posture: Secondary | ICD-10-CM

## 2017-07-18 DIAGNOSIS — M6281 Muscle weakness (generalized): Secondary | ICD-10-CM | POA: Diagnosis present

## 2017-07-18 NOTE — Therapy (Signed)
[place], [place], [address] Phone: [phone]   Fax:  [phone]  Physical Therapy Evaluation  Patient Details  Name: [name] MRN: [id number] Date of Birth: 1961/03/20 Referring Provider: Dr. Marin Olp    Encounter Date: 07/18/2017  PT End of Session - 07/18/17 1247    Visit Number  1    Number of Visits  4    Date for PT Re-Evaluation  08/15/17    PT Start Time  1105    PT Stop Time  1145    PT Time Calculation (min)  40 min    Activity Tolerance  Patient tolerated treatment well    Behavior During Therapy  [facility] for tasks assessed/performed       Past Medical History:  Diagnosis Date  . Acid reflux   . Cancer [facility])    breast cancer  . Essential hypertension 02/16/2015  . Hypertension   . Obesity 02/16/2015  . Thyroid disease   . Vertigo     Past Surgical History:  Procedure Laterality Date  . ABDOMINAL HYSTERECTOMY    . ESOPHAGEAL DILATION    . MASTECTOMY Left 2007    There were no vitals filed for this visit.   Subjective Assessment - 07/18/17 1117    Subjective  swelling in the left arm and under the arm and back with occasional pain and numbness     Pertinent History  left breast cancer in 1995 with mastectomy with lymph nodes removed ( not sure how many) with chemo.  left arm and back started swelling about 2 years ago  she has recently gotten a sleeve and glove.  she has lymphedema treatment several years ago     Patient Stated Goals  to get the swelling gone     Currently in Pain?  No/denies         [facility] PT Assessment - 07/18/17 0001      Assessment   Medical Diagnosis  left breast mastectomy with lymphedema     Referring Provider  Dr. Marin Olp     Onset Date/Surgical Date  06/11/93    Hand Dominance  Right      Precautions   Precautions  None      Restrictions   Weight Bearing Restrictions  No      Balance Screen   Has the patient fallen in the past 6  months  No    Has the patient had a decrease in activity level because of a fear of falling?   No    Is the patient reluctant to leave their home because of a fear of falling?   No      Home Social worker  Private residence    Living Arrangements  Other relatives    Available Help at Discharge  Available PRN/intermittently      Prior Function   Level of Independence  Independent    Vocation  Unemployed    Leisure  tries to walk 2x a week, takes care of her home       Cognition   Overall Cognitive Status  Within Functional Limits for tasks assessed      Observation/Other Assessments   Observations  pt with obesity who has visible fullness in left UE, axilla and lateral chest     Skin Integrity  no open areas     Quick DASH   38.64      Observation/Other Assessments-Edema    Edema  Circumferential      Sensation   Light Touch  Not tested      Coordination   Gross Motor Movements are Fluid and Coordinated  Yes      Posture/Postural Control   Posture/Postural Control  Postural limitations    Postural Limitations  Rounded Shoulders;Forward head    Posture Comments  scapular assymmetry       ROM / Strength   AROM / PROM / Strength  AROM      AROM   Right Shoulder Flexion  155 Degrees    Right Shoulder ABduction  150 Degrees    Left Shoulder Flexion  150 Degrees    Left Shoulder ABduction  130 Degrees      Strength   Overall Strength Comments  overall strength about 3/5/ Pt reports she has difficutly raising arms overhead to do her hair, She fatigues easily       Palpation   Palpation comment  pitting edema in left arm         LYMPHEDEMA/ONCOLOGY QUESTIONNAIRE - 07/18/17 1130      Type   Cancer Type  left breast       Surgeries   Mastectomy Date  06/11/93 approximate date       Treatment   Past Chemotherapy Treatment  Yes    Date  11/09/93      What other symptoms do you have   Are you Having Heaviness or Tightness  Yes    Are you having  Pain  Yes    Are you having pitting edema  Yes    Body Site  left arm and hand     Is it Hard or Difficult finding clothes that fit  Yes    Do you have infections  No    Is there Decreased scar mobility  No    St[place]mer Sign  Yes      Lymphedema Stage   Stage  STAGE 2 SPONTANEOUSLY IRREVERSIBLE gets a little better in the morning       Right Upper Extremity Lymphedema   10 cm Proximal to Olecranon Process  34.7 cm    Olecranon Process  27.2 cm    15 cm Proximal to Ulnar Styloid Process  28.8 cm    10 cm Proximal to Ulnar Styloid Process  25 cm    Just Proximal to Ulnar Styloid Process  17.5 cm    Across Hand at [employer]  20.8 cm    At [place] of 2nd Digit  6.2 cm      Left Upper Extremity Lymphedema   15 cm Proximal to Olecranon Process  36 cm    Olecranon Process  29.5 cm    15 cm Proximal to Ulnar Styloid Process  30.5 cm    10 cm Proximal to Ulnar Styloid Process  27.5 cm    Just Proximal to Ulnar Styloid Process  19 cm    Across Hand at [employer]  22 cm    At [place] of 2nd Digit  7 cm        Quick Dash - 07/18/17 0001    Open a tight or new jar  Unable    Do heavy household chores (wash walls, wash floors)  Unable    Carry a shopping bag or briefcase  Mild difficulty    Wash your back  No difficulty    Use a knife to cut food  No difficulty    Recreational activities in which you  take some force or impact through your arm, shoulder, or hand (golf, hammering, tennis)  Unable    During the past week, to what extent has your arm, shoulder or hand problem interfered with your normal social activities with family, friends, neighbors, or groups?  Slightly    During the past week, to what extent has your arm, shoulder or hand problem limited your work or other regular daily activities  Slightly    Arm, shoulder, or hand pain.  Mild    Tingling (pins and needles) in your arm, shoulder, or hand  Mild    Difficulty Sleeping  No difficulty    DASH Score  38.64 %        Objective measurements completed on examination: See above findings.            [facility] Adult PT Treatment/Exercise - 07/18/17 0001      Self-Care   Self-Care  Other Self-Care Comments    Other Self-Care Comments   talked with pt about flat knit garments, nighttime garments, compression pump and gave her information about [employer]                   PT Long Term Goals - 07/18/17 1253      PT LONG TERM GOAL #1   Title  Pt will report that she knows how to manage her lymphedema at home with good skin care, compression garments and pump and exercise program     Baseline  partial knowledge     Time  4    Period  Weeks    Status  New      PT LONG TERM GOAL #2   Title  Pt will be independent in a home exercise program     Baseline  no knowledge    Time  4    Period  Weeks    Status  New      PT LONG TERM GOAL #3   Title  Pt will reduce the circumference of left forearm 10 cm proximal to the ulnar styoloid by 1 cm     Baseline  27.5    Time  4    Period  Weeks    Status  New             Plan - 07/18/17 1247    Clinical Impression Statement  57 yo female s/p remote mastectomy who has LUE and truncal lymphedema in axilla, left lateral chest and back.  She has circular knit compression garments that are not containing her.  She needs to be upgraded to flat knit garments, nighttime garment, compression pump with a trunk component and an exrercise program  to decrease her risk of cellulitis  received ok from pt to send demographics to [facility] for garment fitting     History and Personal Factors relevant to plan of care:  longstanding lymphedema , no regular exercise     Clinical Presentation  Evolving    Clinical Presentation due to:  lymphedema is progressive condition     Clinical Decision Making  Moderate    Rehab Potential  Good    Clinical Impairments Affecting Rehab Potential  previous mastectomy and chemotherapy     PT Frequency  1x / week     PT Duration  3 weeks    PT Treatment/Interventions  Manual lymph drainage;Compression bandaging;Taping;Vasopneumatic Device;Scar mobilization;Patient/family education;Orthotic Fit/Training;Manual techniques;Therapeutic exercise;Therapeutic activities;DME Instruction;ADLs/Self Care Home Management    PT Next Visit Plan  manual lymph draiange ,  supine scapular series, teach HEP assess garments that pt brings in, discuss compression pump     Consulted and Agree with Plan of Care  Patient       Patient will benefit from skilled therapeutic intervention in order to improve the following deficits and impairments:  Decreased knowledge of precautions, Increased edema, Obesity, Pain, Decreased strength, Decreased knowledge of use of DME, Impaired perceived functional ability, Decreased range of motion, Postural dysfunction  Visit Diagnosis: Postmastectomy lymphedema - Plan: PT plan of care cert/re-cert  Abnormal posture - Plan: PT plan of care cert/re-cert  Muscle weakness (generalized) - Plan: PT plan of care cert/re-cert     Problem List Patient Active Problem List   Diagnosis Date Noted  . Essential hypertension 02/16/2015  . Obesity 02/16/2015  . Acute sinus infection 06/09/2014  . Adenocarcinoma of left breast ([place]) 10/27/2013   Donato Heinz. Owens Shark PT  Norwood Levo 07/18/2017, 12:58 PM  [place], [place], [address] Phone: [phone]   Fax:  [phone]  Name: [name] MRN: [id number] Date of Birth: Aug 10, 1960

## 2017-07-23 ENCOUNTER — Emergency Department (HOSPITAL_COMMUNITY)
Admission: EM | Admit: 2017-07-23 | Discharge: 2017-07-23 | Disposition: A | Discharge status: Home / Self Care | Payer: Medicaid Other | Attending: Emergency Medicine | Admitting: Emergency Medicine

## 2017-07-23 ENCOUNTER — Encounter (HOSPITAL_COMMUNITY): Payer: Self-pay | Admitting: *Deleted

## 2017-07-23 DIAGNOSIS — Z79899 Other long term (current) drug therapy: Secondary | ICD-10-CM | POA: Insufficient documentation

## 2017-07-23 DIAGNOSIS — G5621 Lesion of ulnar nerve, right upper limb: Secondary | ICD-10-CM

## 2017-07-23 DIAGNOSIS — Z853 Personal history of malignant neoplasm of breast: Secondary | ICD-10-CM | POA: Insufficient documentation

## 2017-07-23 DIAGNOSIS — I10 Essential (primary) hypertension: Secondary | ICD-10-CM | POA: Diagnosis not present

## 2017-07-23 DIAGNOSIS — M79601 Pain in right arm: Secondary | ICD-10-CM | POA: Diagnosis present

## 2017-07-23 MED ORDER — DICLOFENAC SODIUM 1 % TD GEL: 2.0000 g | Freq: Four times a day (QID) | TRANSDERMAL | 1 refills | Status: DC

## 2017-07-23 NOTE — ED Notes (Signed)
Pt stable, ambulatory, states understanding of discharge instructions 

## 2017-07-23 NOTE — ED Provider Notes (Signed)
[facility] EMERGENCY DEPARTMENT Provider Note   CSN: [id number] Arrival date & time: 07/23/17  1443     History   Chief Complaint Chief Complaint  Patient presents with  . Arm Pain    HPI [name] is a 57 y.o. female.  The history is provided by the patient.  Arm Pain  This is a new problem. The current episode started 1 to 2 hours ago. The problem occurs constantly. The problem has been resolved. Pertinent negatives include no chest pain, no abdominal pain and no shortness of breath. Associated symptoms comments: Patient states symptoms started today while she was sitting on the couch with her arm bent texting when suddenly she developed a sharp electric type pain in her fingers up to her elbow.  This lasted for 10-15 minutes and now her arm just feels sore but the pain is significantly improved.  No neck pain, weakness.  She states she has chronic issues with her left arm related to lymphedema and swelling but no new pain there.. Nothing aggravates the symptoms. Nothing relieves the symptoms. She has tried nothing for the symptoms. The treatment provided significant relief.    Past Medical History:  Diagnosis Date  . Acid reflux   . Cancer [facility])    breast cancer  . Essential hypertension 02/16/2015  . Hypertension   . Obesity 02/16/2015  . Thyroid disease   . Vertigo     Patient Active Problem List   Diagnosis Date Noted  . Essential hypertension 02/16/2015  . Obesity 02/16/2015  . Acute sinus infection 06/09/2014  . Adenocarcinoma of left breast ([place]) 10/27/2013    Past Surgical History:  Procedure Laterality Date  . ABDOMINAL HYSTERECTOMY    . ESOPHAGEAL DILATION    . MASTECTOMY Left 2007    OB History    Gravida Para Term Preterm AB Living   2 2 2     2    SAB TAB Ectopic Multiple Live Births                   Home Medications    Prior to Admission medications   Medication Sig Start Date End Date Taking? Authorizing Provider    albuterol (PROVENTIL HFA) 108 (90 Base) MCG/ACT inhaler INHALE 2 PUFFS BY MOUTH INTO THE LUNGS EVERY 4 HOURS AS NEEDED FOR WHEEZING OR SHORTNESS OF BREATH 07/03/17   Volanda Napoleon, MD  amoxicillin (AMOXIL) 500 MG capsule Take 500 mg by mouth 2 (two) times daily.    [provider]  amoxicillin-clavulanate (AUGMENTIN) 875-125 MG tablet Take 1 tablet by mouth every 12 (twelve) hours. Patient not taking: Reported on 07/06/2017 12/28/15   Duffy Bruce, MD  levothyroxine (SYNTHROID, LEVOTHROID) 150 MCG tablet TAKE ONE TABLET BY MOUTH ONCE DAILY BEFORE BREAKFAST Patient taking differently: TAKE 150 mcg TABLET BY MOUTH ONCE DAILY BEFORE BREAKFAST 06/24/15   Volanda Napoleon, MD  lisinopril-hydrochlorothiazide (PRINZIDE,ZESTORETIC) 10-12.5 MG per tablet Take 1 tablet by mouth daily. 03/01/15   Volanda Napoleon, MD  omeprazole (PRILOSEC) 20 MG capsule Take 1 capsule (20 mg total) by mouth daily. 09/09/15   Volanda Napoleon, MD  oxymetazoline (AFRIN NASAL SPRAY) 0.05 % nasal spray Place 1 spray into both nostrils 2 (two) times daily as needed for congestion. Do NOT use for more than 3 days. Patient not taking: Reported on 07/06/2017 12/28/15   Duffy Bruce, MD  UNABLE TO FIND Lymphedema sleeve for L arm 07/04/17   Volanda Napoleon, MD  Family History Family History  Problem Relation Age of Onset  . Asthma Mother   . Diabetes Father   . Breast cancer Paternal Aunt     Social History Social History   Tobacco Use  . Smoking status: Never Smoker  . Smokeless tobacco: Never Used  . Tobacco comment: never used tobacco  Substance Use Topics  . Alcohol use: No  . Drug use: No     Allergies   Aspirin   Review of Systems Review of Systems  Respiratory: Negative for shortness of breath.   Cardiovascular: Negative for chest pain.  Gastrointestinal: Negative for abdominal pain.  Musculoskeletal:       For over a month she has had pain in the lateral left knee and lower leg only at  night when she tries to sleep.  Taking tylenol but not getting better.  To see PCP in march for recheck as she has been dx with arthritis  All other systems reviewed and are negative.    Physical Exam Updated Vital Signs BP (!) 141/80 (BP Location: Right Arm)   Pulse 73   Temp 98.2 F (36.8 C) (Oral)   Resp 18   Ht 5\' 4"  (1.626 m)   Wt 97.5 kg (215 lb)   SpO2 100%   BMI 36.90 kg/m   Physical Exam  Constitutional: She is oriented to person, place, and time. She appears well-developed and well-nourished. No distress.  HENT:  Head: Normocephalic and atraumatic.  Eyes: EOM are normal. Pupils are equal, round, and reactive to light.  Cardiovascular: Normal rate.  Pulmonary/Chest: Effort normal.  Musculoskeletal: She exhibits tenderness.       Left knee: She exhibits normal range of motion, no swelling, no effusion, no ecchymosis and no erythema. Tenderness found. Lateral joint line tenderness noted.  5/5 strength in finger extention, interosseous muscles and wrist flexion and extention and normal strength 5/5 in finger opposition.  Mild tenderness over the lateral elbow but no swelling or erythema.    Neurological: She is alert and oriented to person, place, and time.  Skin: Skin is warm and dry. Capillary refill takes less than 2 seconds.  Psychiatric: She has a normal mood and affect. Her behavior is normal.  Nursing note and vitals reviewed.    ED Treatments / Results  Labs (all labs ordered are listed, but only abnormal results are displayed) Labs Reviewed - No data to display  EKG  EKG Interpretation  Date/Time:  Tuesday July 23 2017 09:39:43 EST Ventricular Rate:  75 PR Interval:  170 QRS Duration: 80 QT Interval:  350 QTC Calculation: 390 R Axis:   39 Text Interpretation:  Normal sinus rhythm Nonspecific T wave abnormality No significant change since last tracing Confirmed by Blanchie Dessert [phone]) on 07/23/2017 12:40:43 PM       Radiology No results  found.  Procedures Procedures (including critical care time)  Medications Ordered in ED Medications - No data to display   Initial Impression / Assessment and Plan / ED Course  I have reviewed the triage vital signs and the nursing notes.  Pertinent labs & imaging results that were available during my care of the patient were reviewed by me and considered in my medical decision making (see chart for details).     Patient presenting with symptoms most suggestive of an ulnar neuropathy resulting in sharp pain in her hand up to her elbow today that is now improving.  This occurred after she had had her arm bent and resting  on her side for some time.  She does not do repetitive movements and has no obvious weakness in the ulnar, radial or median distributions.  She is having no neck pain or swelling of her hand concerning for cervical radiculopathy or DVT.  Recommended being conscientious of how she has her arm positioned as well as taking Tylenol as needed. Patient also having ongoing issues with her knee with known arthritis.  Having pain at night in the lateral knee and lateral lower leg.  However no evidence of varicose veins concerning for phlebitis and no swelling or other symptoms concerning for DVT.  Patient has scheduled follow-up with her PCP which she was encouraged to go to.  Also given Voltaren gel to try at night for pain.  Final Clinical Impressions(s) / ED Diagnoses   Final diagnoses:  Ulnar neuropathy at elbow of right upper extremity    ED Discharge Orders        Ordered    diclofenac sodium (VOLTAREN) 1 % GEL  4 times daily     07/23/17 1307       Blanchie Dessert, MD 07/23/17 1307

## 2017-07-23 NOTE — ED Triage Notes (Addendum)
Pt states that she has had rt arm pain and occasional left arm pain since last night. Pt also reports feeling her heart "Flutter" intermittently.  Pt denies chest pain.

## 2017-07-24 ENCOUNTER — Other Ambulatory Visit: Payer: Self-pay | Admitting: *Deleted

## 2017-07-24 DIAGNOSIS — C50912 Malignant neoplasm of unspecified site of left female breast: Secondary | ICD-10-CM

## 2017-07-25 ENCOUNTER — Inpatient Hospital Stay: Payer: Medicaid Other | Attending: Hematology & Oncology

## 2017-07-30 ENCOUNTER — Other Ambulatory Visit: Payer: Self-pay

## 2017-07-30 DIAGNOSIS — I10 Essential (primary) hypertension: Secondary | ICD-10-CM | POA: Insufficient documentation

## 2017-07-30 DIAGNOSIS — R1012 Left upper quadrant pain: Secondary | ICD-10-CM | POA: Diagnosis present

## 2017-07-30 DIAGNOSIS — E039 Hypothyroidism, unspecified: Secondary | ICD-10-CM | POA: Diagnosis not present

## 2017-07-30 DIAGNOSIS — Z853 Personal history of malignant neoplasm of breast: Secondary | ICD-10-CM | POA: Diagnosis not present

## 2017-07-30 DIAGNOSIS — Z79899 Other long term (current) drug therapy: Secondary | ICD-10-CM | POA: Diagnosis not present

## 2017-07-30 LAB — COMPREHENSIVE METABOLIC PANEL
ALK PHOS: 91 U/L (ref 38–126)
ALT: 22 U/L (ref 14–54)
AST: 26 U/L (ref 15–41)
Albumin: 4.5 g/dL (ref 3.5–5.0)
Anion gap: 12 (ref 5–15)
BUN: 22 mg/dL — ABNORMAL HIGH (ref 6–20)
CALCIUM: 8.9 mg/dL (ref 8.9–10.3)
CO2: 23 mmol/L (ref 22–32)
CREATININE: 0.82 mg/dL (ref 0.44–1.00)
Chloride: 103 mmol/L (ref 101–111)
Glucose, Bld: 127 mg/dL — ABNORMAL HIGH (ref 65–99)
Potassium: 3.4 mmol/L — ABNORMAL LOW (ref 3.5–5.1)
Sodium: 138 mmol/L (ref 135–145)
Total Bilirubin: 0.3 mg/dL (ref 0.3–1.2)
Total Protein: 8.4 g/dL — ABNORMAL HIGH (ref 6.5–8.1)

## 2017-07-30 LAB — CBC
HCT: 37.1 % (ref 36.0–46.0)
Hemoglobin: 12.7 g/dL (ref 12.0–15.0)
MCH: 29.1 pg (ref 26.0–34.0)
MCHC: 34.2 g/dL (ref 30.0–36.0)
MCV: 84.9 fL (ref 78.0–100.0)
PLATELETS: 245 10*3/uL (ref 150–400)
RBC: 4.37 MIL/uL (ref 3.87–5.11)
RDW: 13.2 % (ref 11.5–15.5)
WBC: 6.7 10*3/uL (ref 4.0–10.5)

## 2017-07-30 LAB — I-STAT BETA HCG BLOOD, ED (MC, WL, AP ONLY)

## 2017-07-30 LAB — LIPASE, BLOOD: Lipase: 41 U/L (ref 11–51)

## 2017-07-30 NOTE — ED Triage Notes (Signed)
Pt states for last week she has been having left abd pain on and off, no other symptoms with that concern, states tonight her face and palm of left hand turned red, no noted coloration during triage.

## 2017-07-31 ENCOUNTER — Emergency Department (HOSPITAL_COMMUNITY)
Admission: EM | Admit: 2017-07-31 | Discharge: 2017-07-31 | Disposition: A | Discharge status: Home / Self Care | Payer: Medicaid Other | Attending: Emergency Medicine | Admitting: Emergency Medicine

## 2017-07-31 ENCOUNTER — Emergency Department (HOSPITAL_COMMUNITY): Payer: Medicaid Other

## 2017-07-31 DIAGNOSIS — R1012 Left upper quadrant pain: Secondary | ICD-10-CM

## 2017-07-31 DIAGNOSIS — R109 Unspecified abdominal pain: Secondary | ICD-10-CM

## 2017-07-31 LAB — URINALYSIS, ROUTINE W REFLEX MICROSCOPIC
BILIRUBIN URINE: NEGATIVE
Bacteria, UA: NONE SEEN
Glucose, UA: NEGATIVE mg/dL
KETONES UR: NEGATIVE mg/dL
Nitrite: NEGATIVE
PH: 5 (ref 5.0–8.0)
Protein, ur: NEGATIVE mg/dL
Specific Gravity, Urine: 1.016 (ref 1.005–1.030)

## 2017-07-31 NOTE — ED Provider Notes (Signed)
[place] DEPT Provider Note   CSN: [id number] Arrival date & time: 07/30/17  2042     History   Chief Complaint Chief Complaint  Patient presents with  . Abdominal Pain    left side    HPI [name] is a 57 y.o. female.  HPI Patient is a 57 year old female presents to the emergency department with complaints of 1 week of intermittent upper and left-sided abdominal pain which is been waxing and waning.  Pain lasts for 2-3 minutes.  She denies nausea and vomiting.  Denies diarrhea.  Denies constipation.  Denies fevers or chills.  Denies chest pain or shortness of breath.  Denies weakness of her arms or legs.  No headache.  No other complaints.   Past Medical History:  Diagnosis Date  . Acid reflux   . Cancer [facility])    breast cancer  . Essential hypertension 02/16/2015  . Hypertension   . Obesity 02/16/2015  . Thyroid disease   . Vertigo     Patient Active Problem List   Diagnosis Date Noted  . Essential hypertension 02/16/2015  . Obesity 02/16/2015  . Acute sinus infection 06/09/2014  . Adenocarcinoma of left breast ([place]) 10/27/2013    Past Surgical History:  Procedure Laterality Date  . ABDOMINAL HYSTERECTOMY    . ESOPHAGEAL DILATION    . MASTECTOMY Left 2007    OB History    Gravida Para Term Preterm AB Living   2 2 2     2    SAB TAB Ectopic Multiple Live Births                   Home Medications    Prior to Admission medications   Medication Sig Start Date End Date Taking? Authorizing Provider  albuterol (PROVENTIL HFA) 108 (90 Base) MCG/ACT inhaler INHALE 2 PUFFS BY MOUTH INTO THE LUNGS EVERY 4 HOURS AS NEEDED FOR WHEEZING OR SHORTNESS OF BREATH 07/03/17   Volanda Napoleon, MD  levothyroxine (SYNTHROID, LEVOTHROID) 150 MCG tablet TAKE ONE TABLET BY MOUTH ONCE DAILY BEFORE BREAKFAST Patient taking differently: TAKE 150 mcg TABLET BY MOUTH ONCE DAILY BEFORE BREAKFAST 06/24/15   Volanda Napoleon, MD    lisinopril-hydrochlorothiazide (PRINZIDE,ZESTORETIC) 10-12.5 MG per tablet Take 1 tablet by mouth daily. 03/01/15   Volanda Napoleon, MD  omeprazole (PRILOSEC) 20 MG capsule Take 1 capsule (20 mg total) by mouth daily. 09/09/15   Volanda Napoleon, MD  UNABLE TO FIND Lymphedema sleeve for L arm 07/04/17   Volanda Napoleon, MD    Family History Family History  Problem Relation Age of Onset  . Asthma Mother   . Diabetes Father   . Breast cancer Paternal Aunt     Social History Social History   Tobacco Use  . Smoking status: Never Smoker  . Smokeless tobacco: Never Used  . Tobacco comment: never used tobacco  Substance Use Topics  . Alcohol use: No  . Drug use: No     Allergies   Aspirin   Review of Systems Review of Systems  All other systems reviewed and are negative.    Physical Exam Updated Vital Signs BP 100/63 (BP Location: Right Arm)   Pulse 83   Resp 16   Ht 5\' 4"  (1.626 m)   Wt 97.5 kg (215 lb)   SpO2 100%   BMI 36.90 kg/m   Physical Exam  Constitutional: She is oriented to person, place, and time. She appears well-developed and well-nourished.  HENT:  Head: Normocephalic.  Eyes: EOM are normal.  Neck: Normal range of motion.  Cardiovascular: Normal rate and regular rhythm.  Pulmonary/Chest: Effort normal.  Abdominal: Soft. She exhibits no distension. There is no tenderness.  Musculoskeletal: Normal range of motion.  Neurological: She is alert and oriented to person, place, and time.  Psychiatric: She has a normal mood and affect.  Nursing note and vitals reviewed.    ED Treatments / Results  Labs (all labs ordered are listed, but only abnormal results are displayed) Labs Reviewed  COMPREHENSIVE METABOLIC PANEL - Abnormal; Notable for the following components:      Result Value   Potassium 3.4 (*)    Glucose, Bld 127 (*)    BUN 22 (*)    Total Protein 8.4 (*)    All other components within normal limits  URINALYSIS, ROUTINE W REFLEX  MICROSCOPIC - Abnormal; Notable for the following components:   Hgb urine dipstick MODERATE (*)    Leukocytes, UA TRACE (*)    Squamous Epithelial / LPF 0-5 (*)    All other components within normal limits  LIPASE, BLOOD  CBC  I-STAT BETA HCG BLOOD, ED ([facility], [facility], [facility] ONLY)    EKG  EKG Interpretation None       Radiology Dg Abd 2 Views  Result Date: 07/31/2017 CLINICAL DATA:  Mid abdominal pain EXAM: ABDOMEN - 2 VIEW COMPARISON:  CT 01/21/2007 FINDINGS: Visible lung bases are clear. There is no free air beneath the diaphragm. Nonobstructed bowel-gas pattern with moderate stool in the colon. No abnormal calcification. IMPRESSION: Nonobstructed bowel-gas pattern. Electronically Signed   By: Donavan Foil M.D.   On: 07/31/2017 02:26    Procedures Procedures (including critical care time)  Medications Ordered in ED Medications - No data to display   Initial Impression / Assessment and Plan / ED Course  I have reviewed the triage vital signs and the nursing notes.  Pertinent labs & imaging results that were available during my care of the patient were reviewed by me and considered in my medical decision making (see chart for details).     Overall well-appearing.  Repeat abdominal exam without tenderness.  Workup in the emergency department without significant abnormality.  She does have moderate stool in the colon and some of this could represent constipation.  Recommended over-the-counter stool softener.  Primary care follow-up.  Patient understands to return to the emergency department for new or worsening symptoms  Final Clinical Impressions(s) / ED Diagnoses   Final diagnoses:  Abdominal pain  Left upper quadrant pain    ED Discharge Orders    None       Jola Schmidt, MD 07/31/17 [phone]

## 2017-08-07 ENCOUNTER — Ambulatory Visit: Payer: Medicaid Other

## 2017-08-13 ENCOUNTER — Telehealth: Payer: Self-pay

## 2017-08-13 ENCOUNTER — Ambulatory Visit: Payer: Medicaid Other | Attending: Hematology & Oncology

## 2017-08-13 DIAGNOSIS — I972 Postmastectomy lymphedema syndrome: Secondary | ICD-10-CM | POA: Insufficient documentation

## 2017-08-13 NOTE — Telephone Encounter (Signed)
Left voice message informing pt that she had missed her last appt with [place] and if she felt the need to continue therapy she was going to have to call us back to R/S.

## 2017-08-14 ENCOUNTER — Ambulatory Visit: Payer: Medicaid Other

## 2017-08-14 DIAGNOSIS — I972 Postmastectomy lymphedema syndrome: Secondary | ICD-10-CM | POA: Diagnosis present

## 2017-08-14 NOTE — Patient Instructions (Signed)
Start with circles near neck, above collarbones. 10 times   Cancer Rehab [phone]  Deep Effective Breath   Standing, sitting, or laying down, place both hands on the belly. Take a deep breath IN, expanding the belly; then breath OUT, contracting the belly. Repeat __5__ times. Do __2-3__ sessions per day and before your self massage.  Axilla to Axilla - Sweep   On uninvolved side make 5 circles in the armpit, then pump _5__ times from involved armpit across chest to uninvolved armpit, making a pathway. Do _1__ time per day.  Copyright  VHI. All rights reserved.  Axilla to Inguinal Nodes - Sweep   On involved side, make 5 circles at groin at panty line, then pump _5__ times from armpit along side of trunk to outer hip, making your other pathway. Do __1_ time per day.  Copyright  VHI. All rights reserved.  Arm Posterior: Elbow to Shoulder - Sweep   Pump _5__ times from back of elbow to top of shoulder. Then inner to outer upper arm _5_ times, then outer arm again _5_ times. Then back to the pathways _2-3_ times. Do _1__ time per day.  Copyright  VHI. All rights reserved.  ARM: [place] Wrist to Elbow - Sweep   Pump or stationary circles _5__ times from wrist to elbow making sure to do both sides of the forearm. Then retrace your steps to the outer arm, and the pathways _2-3_ times each. Do _1__ time per day.  Copyright  VHI. All rights reserved.  ARM: Dorsum of Hand to Shoulder - Sweep   Pump or stationary circles _5__ times on back of hand including knuckle spaces and individual fingers if needed working up towards the wrist, then retrace all your steps working back up the forearm, doing both sides; upper outer arm and back to your pathways _2-3_ times each. Then do 5 circles again at uninvolved armpit and involved groin where you started! Good job!! Do __1_ time per day. 

## 2017-08-14 NOTE — Therapy (Addendum)
[place], [place], [address] Phone: [phone]   Fax:  [phone]  Physical Therapy Treatment  Patient Details  Name: SCOTLAND DOST MRN: [id number] Date of Birth: 06-16-1960 Referring Provider: Dr. Marin Olp    Encounter Date: 08/14/2017  PT End of Session - 08/14/17 1107    Visit Number  2    Number of Visits  4    Date for PT Re-Evaluation  08/15/17    PT Start Time  1020    PT Stop Time  1108    PT Time Calculation (min)  48 min    Activity Tolerance  Patient tolerated treatment well    Behavior During Therapy  [facility] for tasks assessed/performed       Past Medical History:  Diagnosis Date  . Acid reflux   . Cancer [facility])    breast cancer  . Essential hypertension 02/16/2015  . Hypertension   . Obesity 02/16/2015  . Thyroid disease   . Vertigo     Past Surgical History:  Procedure Laterality Date  . ABDOMINAL HYSTERECTOMY    . ESOPHAGEAL DILATION    . MASTECTOMY Left 2007    There were no vitals filed for this visit.  Subjective Assessment - 08/14/17 1023    Subjective  I have to rely on others for transportation so it's been hard for me to get here. The swelling in my arm comes and goes. I have some soreness in my Lt back maybe from how I slept yesterday, no pain though. Cathy measured me for my compression garments shortly after my visit here so hopefully that will be arriving soon.     Pertinent History  left breast cancer in 1995 with mastectomy with lymph nodes removed ( not sure how many) with chemo.  left arm and back started swelling about 2 years ago  she has recently gotten a sleeve and glove.  she has lymphedema treatment several years ago     Patient Stated Goals  to get the swelling gone     Currently in Pain?  No/denies            LYMPHEDEMA/ONCOLOGY QUESTIONNAIRE - 08/14/17 1027      Left Upper Extremity Lymphedema   15 cm Proximal to Olecranon Process  34.3 cm     Olecranon Process  28.4 cm    15 cm Proximal to Ulnar Styloid Process  29.1 cm    10 cm Proximal to Ulnar Styloid Process  26.5 cm    Just Proximal to Ulnar Styloid Process  19.7 cm    Across Hand at [employer]  21.2 cm    At [place] of 2nd Digit  6.8 cm               [facility] Adult PT Treatment/Exercise - 08/14/17 0001      Manual Therapy   Manual Therapy  Manual Lymphatic Drainage (MLD)    Manual Lymphatic Drainage (MLD)  In Supine: Short neck, 5 diaphragmatic breaths, Lt inguinal and Lt axilla nodes, Lt axillo-inguinal and anterior inter-axillary anastomosis, and Lt UE from dorsal hand to lateral shoulder working from proximal to distal then retracing steps, instructing pt throughout.              PT Education - 08/14/17 1033    Education provided  Yes    Education Details  Manual lymph drainage    Person(s) Educated  Patient    Methods  Explanation;Demonstration;Tactile cues;Verbal cues;Handout  Comprehension  Verbalized understanding;Returned demonstration;Need further instruction          PT Long Term Goals - 08/14/17 1227      PT LONG TERM GOAL #1   Title  Pt will report that she knows how to manage her lymphedema at home with good skin care, compression garments and pump and exercise program     Baseline  partial knowledge ; pt was instructed in self manual lymph drainage today and she has been measured for compression garments-08/14/17    Status  Partially Met      PT LONG TERM GOAL #2   Title  Pt will be independent in a home exercise program     Status  On-going      PT LONG TERM GOAL #3   Title  Pt will reduce the circumference of left forearm 10 cm proximal to the ulnar styoloid by 1 cm     Baseline  27.5; 26.5-08/14/17    Status  Achieved            Plan - 08/14/17 1211    Clinical Impression Statement  Pt was instructed today in self manual lymph drainage which she returned demonstration of well. She was measured for new compression  garments about 2 weeks ago so hopefully these will be arriving soon. Pt hasn't come to therapy until now due to transportation issues (she has to rely on others for a ride) but reports she does want to come for her remainng 2 visits so reauth to Medicaid will be sent by PT today.     Rehab Potential  Good    Clinical Impairments Affecting Rehab Potential  previous mastectomy and chemotherapy     PT Frequency  1x / week    PT Duration  3 weeks    PT Treatment/Interventions  Manual lymph drainage;Compression bandaging;Taping;Vasopneumatic Device;Scar mobilization;Patient/family education;Orthotic Fit/Training;Manual techniques;Therapeutic exercise;Therapeutic activities;DME Instruction;ADLs/Self Care Home Management    PT Next Visit Plan  Renewal next visit (Medicaid reauth done today requesting 1x/wk for 4 weeks). Review self manual lymph drainage having pt perform, instruct in supine scapular series for HEP; discuss compression pump    Consulted and Agree with Plan of Care  Patient       Patient will benefit from skilled therapeutic intervention in order to improve the following deficits and impairments:  Decreased knowledge of precautions, Increased edema, Obesity, Pain, Decreased strength, Decreased knowledge of use of DME, Impaired perceived functional ability, Decreased range of motion, Postural dysfunction  Visit Diagnosis: Postmastectomy lymphedema     Problem List Patient Active Problem List   Diagnosis Date Noted  . Essential hypertension 02/16/2015  . Obesity 02/16/2015  . Acute sinus infection 06/09/2014  . Adenocarcinoma of left breast ([place]) 10/27/2013    Otelia Limes, PTA 08/14/2017, 12:29 PM  [place], [place], [address] Phone: [phone]   Fax:  [phone]  Name: ALYNAH SCHONE MRN: [id number] Date of Birth: 20-Sep-1960  PHYSICAL THERAPY DISCHARGE SUMMARY  Visits from  Start of Care: 2  Current functional level related to goals / functional outcomes: unknown   Remaining deficits: unknown   Education / Equipment: As above  Plan: Patient agrees to discharge.  Patient goals were not met. Patient is being discharged due to meeting the stated rehab goals.  ?????    Maudry Diego, PT 11/29/17 12:27 PM

## 2017-09-12 ENCOUNTER — Encounter (HOSPITAL_COMMUNITY): Payer: Self-pay | Admitting: Emergency Medicine

## 2017-09-12 DIAGNOSIS — Z853 Personal history of malignant neoplasm of breast: Secondary | ICD-10-CM | POA: Insufficient documentation

## 2017-09-12 DIAGNOSIS — R05 Cough: Secondary | ICD-10-CM | POA: Insufficient documentation

## 2017-09-12 DIAGNOSIS — R0602 Shortness of breath: Secondary | ICD-10-CM | POA: Diagnosis present

## 2017-09-12 DIAGNOSIS — E079 Disorder of thyroid, unspecified: Secondary | ICD-10-CM | POA: Diagnosis not present

## 2017-09-12 DIAGNOSIS — Z79899 Other long term (current) drug therapy: Secondary | ICD-10-CM | POA: Diagnosis not present

## 2017-09-12 DIAGNOSIS — I10 Essential (primary) hypertension: Secondary | ICD-10-CM | POA: Diagnosis not present

## 2017-09-12 NOTE — ED Triage Notes (Signed)
Pt from home with c/o cough that began today. Pt states cough with white sputum which began while she was cooking dinner tonight. and then when she went to lay down she felt difficulty breathing. Pt denies fever or chills. Pt is able to speak in complete sentences. Pt is able to maintain oxygen at 99% RA

## 2017-09-13 ENCOUNTER — Emergency Department (HOSPITAL_COMMUNITY)
Admission: EM | Admit: 2017-09-13 | Discharge: 2017-09-13 | Disposition: A | Discharge status: Home / Self Care | Payer: Medicaid Other | Attending: Emergency Medicine | Admitting: Emergency Medicine

## 2017-09-13 ENCOUNTER — Emergency Department (HOSPITAL_COMMUNITY): Payer: Medicaid Other

## 2017-09-13 DIAGNOSIS — R059 Cough, unspecified: Secondary | ICD-10-CM

## 2017-09-13 DIAGNOSIS — R05 Cough: Secondary | ICD-10-CM

## 2017-09-13 MED ORDER — IPRATROPIUM-ALBUTEROL 0.5-2.5 (3) MG/3ML IN SOLN
3.0000 mL | Freq: Once | RESPIRATORY_TRACT | Status: AC
  Administered 2017-09-13: 3 mL via RESPIRATORY_TRACT
  Filled 2017-09-13: qty 3

## 2017-09-13 NOTE — Discharge Instructions (Signed)
It was my pleasure taking care of you today!  Keep your scheduled appointment with your primary care doctor.   Return to ER for new or worsening symptoms, any additional concerns.

## 2017-09-13 NOTE — ED Provider Notes (Signed)
[facility] DEPT Provider Note   CSN: [id number] Arrival date & time: 09/12/17  2301     History   Chief Complaint Chief Complaint  Patient presents with  . Cough  . Shortness of Breath    HPI [name] is a 57 y.o. female.  The history is provided by the patient and medical records. No language interpreter was used.  Cough  Associated symptoms include shortness of breath.  Shortness of Breath  Associated symptoms include cough.   [name] is a 57 y.o. female  with a PMH as listed below who presents to the Emergency Department complaining of cough which started tonight while she was cooking dinner.  She reports having a coughing fit where she reports cough was productive of phlegm. Dry coughing since that time. Associated with wheezing and nasal congestion that just began tonight as well. Patient states that She was not feeling short of breath, but when she laid down to go to sleep, she had another coughing fit and felt like it was hard to breath. Currently does not feel short of breath. No chest pain, fever, chills, leg swelling, calf pain, abdominal pain, back pain. She tried her home inhalers prior to arrival which somewhat helped.   Past Medical History:  Diagnosis Date  . Acid reflux   . Cancer [facility])    breast cancer  . Essential hypertension 02/16/2015  . Hypertension   . Obesity 02/16/2015  . Thyroid disease   . Vertigo     Patient Active Problem List   Diagnosis Date Noted  . Essential hypertension 02/16/2015  . Obesity 02/16/2015  . Acute sinus infection 06/09/2014  . Adenocarcinoma of left breast ([place]) 10/27/2013    Past Surgical History:  Procedure Laterality Date  . ABDOMINAL HYSTERECTOMY    . ESOPHAGEAL DILATION    . MASTECTOMY Left 2007     OB History    Gravida  2   Para  2   Term  2   Preterm      AB      Living  2     SAB      TAB      Ectopic      Multiple      Live Births              Home Medications    Prior to Admission medications   Medication Sig Start Date End Date Taking? Authorizing Provider  albuterol (PROVENTIL HFA) 108 (90 Base) MCG/ACT inhaler INHALE 2 PUFFS BY MOUTH INTO THE LUNGS EVERY 4 HOURS AS NEEDED FOR WHEEZING OR SHORTNESS OF BREATH 07/03/17  Yes Ennever, Rudell Cobb, MD  levothyroxine (SYNTHROID, LEVOTHROID) 150 MCG tablet TAKE ONE TABLET BY MOUTH ONCE DAILY BEFORE BREAKFAST Patient taking differently: TAKE 150 mcg TABLET BY MOUTH ONCE DAILY BEFORE BREAKFAST 06/24/15  Yes Ennever, Rudell Cobb, MD  lisinopril-hydrochlorothiazide (PRINZIDE,ZESTORETIC) 10-12.5 MG per tablet Take 1 tablet by mouth daily. 03/01/15  Yes Volanda Napoleon, MD  omeprazole (PRILOSEC) 20 MG capsule Take 1 capsule (20 mg total) by mouth daily. 09/09/15  Yes Volanda Napoleon, MD  UNABLE TO FIND Lymphedema sleeve for L arm 07/04/17   Volanda Napoleon, MD    Family History Family History  Problem Relation Age of Onset  . Asthma Mother   . Diabetes Father   . Breast cancer Paternal Aunt     Social History Social History   Tobacco Use  . Smoking status: Never Smoker  .  Smokeless tobacco: Never Used  . Tobacco comment: never used tobacco  Substance Use Topics  . Alcohol use: No  . Drug use: No     Allergies   Aspirin   Review of Systems Review of Systems  HENT: Positive for congestion.   Respiratory: Positive for cough and shortness of breath.   All other systems reviewed and are negative.    Physical Exam Updated Vital Signs BP (!) 141/88 (BP Location: Right Arm)   Pulse 85   Temp 98.2 F (36.8 C) (Oral)   Resp 15   Ht 5\' 4"  (1.626 m)   Wt 96.6 kg (213 lb)   SpO2 100%   BMI 36.56 kg/m   Physical Exam  Constitutional: She is oriented to person, place, and time. She appears well-developed and well-nourished. No distress.  HENT:  Head: Normocephalic and atraumatic.  Cardiovascular: Normal rate, regular rhythm and normal heart sounds.  No murmur  heard. Pulmonary/Chest: Effort normal and breath sounds normal. No respiratory distress.  Abdominal: Soft. She exhibits no distension. There is no tenderness.  Musculoskeletal: She exhibits no edema.  Neurological: She is alert and oriented to person, place, and time.  Skin: Skin is warm and dry.  Nursing note and vitals reviewed.    ED Treatments / Results  Labs (all labs ordered are listed, but only abnormal results are displayed) Labs Reviewed - No data to display  EKG None  Radiology Dg Chest 2 View  Result Date: 09/13/2017 CLINICAL DATA:  Productive cough and shortness of breath while lying down for 24 hours. EXAM: CHEST - 2 VIEW COMPARISON:  06/07/2017 FINDINGS: Volume loss and scarring in the left mid and lower lung with elevated left hemidiaphragm. No change since prior study. Right lung is clear and expanded. Surgical clips in the mediastinum and left axilla. No pleural effusions. No pneumothorax. No consolidation or edema. Mediastinal contours appear intact. IMPRESSION: Chronic scarring in the left mid and lower lungs. No evidence of active pulmonary disease. Electronically Signed   By: Lucienne Capers M.D.   On: 09/13/2017 04:45    Procedures Procedures (including critical care time)  Medications Ordered in ED Medications  ipratropium-albuterol (DUONEB) 0.5-2.5 (3) MG/3ML nebulizer solution 3 mL (3 mLs Nebulization Given 09/13/17 0533)     Initial Impression / Assessment and Plan / ED Course  I have reviewed the triage vital signs and the nursing notes.  Pertinent labs & imaging results that were available during my care of the patient were reviewed by me and considered in my medical decision making (see chart for details).    [name] is a 57 y.o. female who presents to ED for cough, congestion, wheezing, shortness of breath which began today. On exam, patient is afebrile, hemodynamically stable with clear lung exam. Evaluation does not show pathology that  would require ongoing emergent intervention or inpatient treatment. Patient has an appointment with PCP next week. Encouraged her to keep this appointment. Reasons to return to ER discussed. All questions answered.   Final Clinical Impressions(s) / ED Diagnoses   Final diagnoses:  Cough    ED Discharge Orders    None       Paralee Pendergrass, Ozella Almond, PA-C 06/19/30 3557    Delora Fuel, MD 32/20/25 [phone]

## 2017-09-20 ENCOUNTER — Other Ambulatory Visit: Payer: Self-pay | Admitting: Physician Assistant

## 2017-09-20 DIAGNOSIS — E049 Nontoxic goiter, unspecified: Secondary | ICD-10-CM

## 2017-09-20 DIAGNOSIS — E039 Hypothyroidism, unspecified: Secondary | ICD-10-CM

## 2017-09-25 ENCOUNTER — Other Ambulatory Visit: Payer: Medicaid Other

## 2017-09-30 ENCOUNTER — Encounter (HOSPITAL_COMMUNITY): Payer: Self-pay | Admitting: Emergency Medicine

## 2017-09-30 ENCOUNTER — Emergency Department (HOSPITAL_COMMUNITY)
Admission: EM | Admit: 2017-09-30 | Discharge: 2017-09-30 | Disposition: A | Discharge status: Home / Self Care | Payer: Medicaid Other | Attending: Emergency Medicine | Admitting: Emergency Medicine

## 2017-09-30 DIAGNOSIS — J3489 Other specified disorders of nose and nasal sinuses: Secondary | ICD-10-CM | POA: Diagnosis present

## 2017-09-30 DIAGNOSIS — I10 Essential (primary) hypertension: Secondary | ICD-10-CM | POA: Diagnosis not present

## 2017-09-30 DIAGNOSIS — Z853 Personal history of malignant neoplasm of breast: Secondary | ICD-10-CM | POA: Insufficient documentation

## 2017-09-30 DIAGNOSIS — R110 Nausea: Secondary | ICD-10-CM | POA: Diagnosis not present

## 2017-09-30 DIAGNOSIS — M6281 Muscle weakness (generalized): Secondary | ICD-10-CM | POA: Insufficient documentation

## 2017-09-30 DIAGNOSIS — E039 Hypothyroidism, unspecified: Secondary | ICD-10-CM | POA: Diagnosis not present

## 2017-09-30 DIAGNOSIS — J0110 Acute frontal sinusitis, unspecified: Secondary | ICD-10-CM | POA: Diagnosis not present

## 2017-09-30 DIAGNOSIS — Z79899 Other long term (current) drug therapy: Secondary | ICD-10-CM | POA: Diagnosis not present

## 2017-09-30 LAB — COMPREHENSIVE METABOLIC PANEL
ALT: 20 U/L (ref 14–54)
ANION GAP: 13 (ref 5–15)
AST: 21 U/L (ref 15–41)
Albumin: 4.4 g/dL (ref 3.5–5.0)
Alkaline Phosphatase: 78 U/L (ref 38–126)
BUN: 14 mg/dL (ref 6–20)
CHLORIDE: 104 mmol/L (ref 101–111)
CO2: 25 mmol/L (ref 22–32)
Calcium: 9.7 mg/dL (ref 8.9–10.3)
Creatinine, Ser: 0.91 mg/dL (ref 0.44–1.00)
GFR calc Af Amer: >60 mL/min (ref >60)
GFR calc non Af Amer: >60 mL/min (ref >60)
Glucose, Bld: 121 mg/dL — ABNORMAL HIGH (ref 65–99)
POTASSIUM: 3.4 mmol/L — AB (ref 3.5–5.1)
Sodium: 142 mmol/L (ref 135–145)
TOTAL PROTEIN: 8.4 g/dL — AB (ref 6.5–8.1)
Total Bilirubin: 0.3 mg/dL (ref 0.3–1.2)

## 2017-09-30 LAB — URINALYSIS, ROUTINE W REFLEX MICROSCOPIC
BACTERIA UA: NONE SEEN
BILIRUBIN URINE: NEGATIVE
Glucose, UA: NEGATIVE mg/dL
KETONES UR: NEGATIVE mg/dL
NITRITE: NEGATIVE
PROTEIN: NEGATIVE mg/dL
SPECIFIC GRAVITY, URINE: 1.014 (ref 1.005–1.030)
pH: 6 (ref 5.0–8.0)

## 2017-09-30 LAB — POCT I-STAT EG7
Acid-Base Excess: 2 mmol/L (ref 0.0–2.0)
BICARBONATE: 27.3 mmol/L (ref 20.0–28.0)
Calcium, Ion: 1.16 mmol/L (ref 1.15–1.40)
HCT: 38 % (ref 36.0–46.0)
Hemoglobin: 12.9 g/dL (ref 12.0–15.0)
O2 SAT: 28 %
PCO2 VEN: 41.9 mmHg — AB (ref 44.0–60.0)
PO2 VEN: 18 mmHg — AB (ref 32.0–45.0)
Potassium: 3.3 mmol/L — ABNORMAL LOW (ref 3.5–5.1)
Sodium: 141 mmol/L (ref 135–145)
TCO2: 29 mmol/L (ref 22–32)
pH, Ven: 7.421 (ref 7.250–7.430)

## 2017-09-30 LAB — CBC
HEMATOCRIT: 38.8 % (ref 36.0–46.0)
HEMOGLOBIN: 13.1 g/dL (ref 12.0–15.0)
MCH: 28.1 pg (ref 26.0–34.0)
MCHC: 33.8 g/dL (ref 30.0–36.0)
MCV: 83.1 fL (ref 78.0–100.0)
Platelets: 281 10*3/uL (ref 150–400)
RBC: 4.67 MIL/uL (ref 3.87–5.11)
RDW: 13 % (ref 11.5–15.5)
WBC: 6 10*3/uL (ref 4.0–10.5)

## 2017-09-30 LAB — LIPASE, BLOOD: LIPASE: 36 U/L (ref 11–51)

## 2017-09-30 MED ORDER — FLUTICASONE PROPIONATE 50 MCG/ACT NA SUSP: 2.0000 | Freq: Every day | NASAL | 2 refills | Status: DC

## 2017-09-30 MED ORDER — IBUPROFEN 600 MG PO TABS: 600.0000 mg | ORAL_TABLET | Freq: Four times a day (QID) | ORAL | 0 refills | Status: DC | PRN

## 2017-09-30 MED ORDER — LORATADINE 10 MG PO TABS: 10.0000 mg | ORAL_TABLET | Freq: Every day | ORAL | 0 refills | Status: DC

## 2017-09-30 NOTE — ED Triage Notes (Addendum)
Patient c/o nausea and fatigue with left ear pain x2 days. Denies cough, congestion, SOB, chest pain, abdominal pain, N/V/D. Speaking in full sentences without difficulty.

## 2017-09-30 NOTE — ED Provider Notes (Signed)
[facility] DEPT Provider Note   CSN: [id number] Arrival date & time: 09/30/17  1604     History   Chief Complaint Chief Complaint  Patient presents with  . Nausea    HPI Lauren TALIERCIO is a 57 y.o. female.  Thep atient presents with sinus pressure, especially when leaning forward, left ear pain without hearing loss, fatigue described as generalized weakness and intermittent, very mild nausea without vomiting that started today. She denies fever, sore throat, diarrhea or headache. She has taken Tylenol with limited relief.   The history is provided by the patient.    Past Medical History:  Diagnosis Date  . Acid reflux   . Cancer [facility])    breast cancer  . Essential hypertension 02/16/2015  . Hypertension   . Obesity 02/16/2015  . Thyroid disease   . Vertigo     Patient Active Problem List   Diagnosis Date Noted  . Essential hypertension 02/16/2015  . Obesity 02/16/2015  . Acute sinus infection 06/09/2014  . 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 ABDOMINAL HYSTERECTOMY    . ESOPHAGEAL DILATION    . MASTECTOMY Left 2007     OB History    Gravida  2   Para  2   Term  2   Preterm      AB      Living  2     SAB      TAB      Ectopic      Multiple      Live Births               Home Medications    Prior to Admission medications   Medication Sig Start Date End Date Taking? Authorizing Provider  acetaminophen (TYLENOL) 500 MG tablet Take 500 mg by mouth every 6 (six) hours as needed for moderate pain.   Yes [provider]  albuterol (PROVENTIL HFA) 108 (90 Base) MCG/ACT inhaler INHALE 2 PUFFS BY MOUTH INTO THE LUNGS EVERY 4 HOURS AS NEEDED FOR WHEEZING OR SHORTNESS OF BREATH 07/03/17  Yes Ennever, Rudell Cobb, MD  levothyroxine (SYNTHROID, LEVOTHROID) 150 MCG tablet TAKE ONE TABLET BY MOUTH ONCE DAILY BEFORE BREAKFAST Patient taking  differently: TAKE 150 mcg TABLET BY MOUTH ONCE DAILY BEFORE BREAKFAST 06/24/15  Yes Ennever, Rudell Cobb, MD  lisinopril-hydrochlorothiazide (PRINZIDE,ZESTORETIC) 10-12.5 MG per tablet Take 1 tablet by mouth daily. 03/01/15  Yes Volanda Napoleon, MD  omeprazole (PRILOSEC) 20 MG capsule Take 1 capsule (20 mg total) by mouth daily. 09/09/15  Yes Volanda Napoleon, MD  UNABLE TO FIND Lymphedema sleeve for L arm 07/04/17   Volanda Napoleon, MD    Family History Family History  Problem Relation Age of Onset  . Asthma Mother   . Diabetes Father   . Breast cancer Paternal Aunt     Social History Social History   Tobacco Use  . Smoking status: Never Smoker  . Smokeless tobacco: Never Used  . Tobacco comment: never used tobacco  Substance Use Topics  . Alcohol use: No  . Drug use: No     Allergies   Aspirin   Review of Systems Review of Systems  Constitutional: Positive for fatigue. Negative for chills, diaphoresis and fever.  HENT: Positive for congestion, ear pain, sinus pressure and sinus pain. Negative for hearing loss, sore throat and trouble swallowing.   Respiratory: Negative.  Negative for cough  and shortness of breath.   Cardiovascular: Negative.  Negative for chest pain.  Gastrointestinal: Positive for nausea. Negative for abdominal pain and vomiting.  Musculoskeletal: Negative.  Negative for myalgias.  Skin: Negative.   Neurological: Positive for weakness. Negative for headaches.     Physical Exam Updated Vital Signs BP 123/79   Pulse 83   Temp 97.9 F (36.6 C) (Oral)   Resp 18   Ht 5\' 4"  (1.626 m)   Wt 96.6 kg (213 lb)   SpO2 100%   BMI 36.56 kg/m   Physical Exam  Constitutional: She is oriented to person, place, and time. She appears well-developed and well-nourished.  HENT:  Head: Normocephalic.  Left nasal mucosal swelling and redness. Frontal sinus tenderness, L>R. TM's clear bilaterally without effusion or redness.   Neck: Normal range of motion. Neck  supple.  Cardiovascular: Normal rate and regular rhythm.  No murmur heard. Pulmonary/Chest: Effort normal and breath sounds normal. She has no wheezes. She has no rales.  Abdominal: Soft. Bowel sounds are normal. There is no tenderness. There is no rebound and no guarding.  Musculoskeletal: Normal range of motion. She exhibits no edema.  Lymphadenopathy:    She has no cervical adenopathy.  Neurological: She is alert and oriented to person, place, and time.  Skin: Skin is warm and dry. No rash noted.  Psychiatric: She has a normal mood and affect.     ED Treatments / Results  Labs (all labs ordered are listed, but only abnormal results are displayed) Labs Reviewed  COMPREHENSIVE METABOLIC PANEL - Abnormal; Notable for the following components:      Result Value   Potassium 3.4 (*)    Glucose, Bld 121 (*)    Total Protein 8.4 (*)    All other components within normal limits  URINALYSIS, ROUTINE W REFLEX MICROSCOPIC - Abnormal; Notable for the following components:   Hgb urine dipstick SMALL (*)    Leukocytes, UA TRACE (*)    Squamous Epithelial / LPF 0-5 (*)    All other components within normal limits  POCT I-STAT EG7 - Abnormal; Notable for the following components:   pCO2, Ven 41.9 (*)    pO2, Ven 18.0 (*)    Potassium 3.3 (*)    All other components within normal limits  LIPASE, BLOOD  CBC  I-STAT BETA HCG BLOOD, ED ([facility], [facility], [facility] ONLY)    EKG None  Radiology No results found.  Procedures Procedures (including critical care time)  Medications Ordered in ED Medications - No data to display   Initial Impression / Assessment and Plan / ED Course  I have reviewed the triage vital signs and the nursing notes.  Pertinent labs & imaging results that were available during my care of the patient were reviewed by me and considered in my medical decision making (see chart for details).     Patient here with ear pain, sinus pressure and general fatigue, weakness. No  fever. Symptoms x 1 day.  Exam supports diagnosis of sinusitis. Doubt bacterial without fever. Labs are reassuring. She is very well appearing. Will treat with supportive measures and encourage PCP follow up.  Final Clinical Impressions(s) / ED Diagnoses   Final diagnoses:  None   1. Sinusitis   ED Discharge Orders    None       Charlann Lange, Hershal Coria 09/30/17 [place], [place], DO 09/30/17 2350

## 2017-10-04 ENCOUNTER — Ambulatory Visit
Admission: RE | Admit: 2017-10-04 | Discharge: 2017-10-04 | Disposition: A | Discharge status: Home / Self Care | Payer: Medicaid Other | Source: Ambulatory Visit | Attending: Physician Assistant | Admitting: Physician Assistant

## 2017-10-04 DIAGNOSIS — E039 Hypothyroidism, unspecified: Secondary | ICD-10-CM

## 2017-10-04 DIAGNOSIS — E049 Nontoxic goiter, unspecified: Secondary | ICD-10-CM

## 2017-11-22 ENCOUNTER — Emergency Department (HOSPITAL_COMMUNITY)
Admission: EM | Admit: 2017-11-22 | Discharge: 2017-11-22 | Disposition: A | Discharge status: Home / Self Care | Payer: Medicaid Other | Attending: Emergency Medicine | Admitting: Emergency Medicine

## 2017-11-22 ENCOUNTER — Encounter (HOSPITAL_COMMUNITY): Payer: Self-pay | Admitting: Emergency Medicine

## 2017-11-22 DIAGNOSIS — J0101 Acute recurrent maxillary sinusitis: Secondary | ICD-10-CM | POA: Insufficient documentation

## 2017-11-22 DIAGNOSIS — Z79899 Other long term (current) drug therapy: Secondary | ICD-10-CM | POA: Diagnosis not present

## 2017-11-22 DIAGNOSIS — Z853 Personal history of malignant neoplasm of breast: Secondary | ICD-10-CM | POA: Diagnosis not present

## 2017-11-22 DIAGNOSIS — I10 Essential (primary) hypertension: Secondary | ICD-10-CM | POA: Insufficient documentation

## 2017-11-22 DIAGNOSIS — J3489 Other specified disorders of nose and nasal sinuses: Secondary | ICD-10-CM | POA: Diagnosis present

## 2017-11-22 MED ORDER — CETIRIZINE HCL 10 MG PO TABS: 10.0000 mg | ORAL_TABLET | Freq: Every day | ORAL | 0 refills | Status: DC

## 2017-11-22 MED ORDER — FLUTICASONE PROPIONATE 50 MCG/ACT NA SUSP: 1.0000 | Freq: Every day | NASAL | 0 refills | Status: DC

## 2017-11-22 NOTE — ED Triage Notes (Signed)
Patient to ED c/o head fullness, congestion, and tingling sensation that starts in her head and goes down her whole body - intermittent for a few days. Patient denies pain, no focal neuro symptoms noted. Denies numbness/tingling at this time.

## 2017-11-22 NOTE — Discharge Instructions (Signed)
Your exam was reassuring.  Take Flonase daily.  Please also take Zyrtec daily.  Please schedule an appointment and follow-up with your regular doctor in a week if your sinus pain is not improving.  Return to the emergency department if you have any new or concerning symptoms like chest pain, trouble breathing, new numbness or weakness.

## 2017-11-22 NOTE — ED Provider Notes (Signed)
[facility] EMERGENCY DEPARTMENT Provider Note   CSN: [id number] Arrival date & time: 11/22/17  1320     History   Chief Complaint Chief Complaint  Patient presents with  . Sinus Pressure    HPI [name] is a 57 y.o. female.  HPI  [name] is a 57yo female with a history of hypertension, obesity, recurrent sinusitis who presents to the emergency department for evaluation of sinus pain and tingling sensation over her body.  Patient reports that for the past 3 days now patient has had intermittet sensation of "tingling and burning sensation washing over my body from head to toe." This lasts for about two seconds and happens once a day.  She reports that it tends to happen when she stands up from a seated position.  Denies visual disturbance, focal numbness or weakness, lightheadedness, room spinning.  States that she is staying hydrated with plenty of water and juice.  She also reports that she has had sinus pain and pressure for the past 3 days, worsened when she leans forward.  Has had some congestion and rhinorrhea.  She reports that she had some bilateral ear fullness, but that is resolved.  She denies fevers, chills, headache, neck pain/stiffness, sore throat, chest pain, shortness of breath, abdominal pain, nausea/vomiting, syncope.  Past Medical History:  Diagnosis Date  . Acid reflux   . Cancer [facility])    breast cancer  . Essential hypertension 02/16/2015  . Hypertension   . Obesity 02/16/2015  . Thyroid disease   . Vertigo     Patient Active Problem List   Diagnosis Date Noted  . Essential hypertension 02/16/2015  . Obesity 02/16/2015  . Acute sinus infection 06/09/2014  . 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 ABDOMINAL HYSTERECTOMY    . ESOPHAGEAL DILATION    . MASTECTOMY Left 2007     OB History    Gravida  2   Para  2   Term  2   Preterm      AB      Living    2     SAB      TAB      Ectopic      Multiple      Live Births               Home Medications    Prior to Admission medications   Medication Sig Start Date End Date Taking? Authorizing Provider  acetaminophen (TYLENOL) 500 MG tablet Take 500 mg by mouth every 6 (six) hours as needed for moderate pain.    [provider]  albuterol (PROVENTIL HFA) 108 (90 Base) MCG/ACT inhaler INHALE 2 PUFFS BY MOUTH INTO THE LUNGS EVERY 4 HOURS AS NEEDED FOR WHEEZING OR SHORTNESS OF BREATH 07/03/17   Volanda Napoleon, MD  fluticasone (FLONASE) 50 MCG/ACT nasal spray Place 2 sprays into both nostrils daily. 09/30/17   Charlann Lange, PA-C  ibuprofen (ADVIL,MOTRIN) 600 MG tablet Take 1 tablet (600 mg total) by mouth every 6 (six) hours as needed. 09/30/17   Charlann Lange, PA-C  levothyroxine (SYNTHROID, LEVOTHROID) 150 MCG tablet TAKE ONE TABLET BY MOUTH ONCE DAILY BEFORE BREAKFAST Patient taking differently: TAKE 150 mcg TABLET BY MOUTH ONCE DAILY BEFORE BREAKFAST 06/24/15   Volanda Napoleon, MD  lisinopril-hydrochlorothiazide (PRINZIDE,ZESTORETIC) 10-12.5 MG per tablet Take 1 tablet by mouth daily. 03/01/15   Volanda Napoleon, MD  loratadine (  CLARITIN) 10 MG tablet Take 1 tablet (10 mg total) by mouth daily. 09/30/17   Charlann Lange, PA-C  omeprazole (PRILOSEC) 20 MG capsule Take 1 capsule (20 mg total) by mouth daily. 09/09/15   Volanda Napoleon, MD  UNABLE TO FIND Lymphedema sleeve for L arm 07/04/17   Volanda Napoleon, MD    Family History Family History  Problem Relation Age of Onset  . Asthma Mother   . Diabetes Father   . Breast cancer Paternal Aunt     Social History Social History   Tobacco Use  . Smoking status: Never Smoker  . Smokeless tobacco: Never Used  . Tobacco comment: never used tobacco  Substance Use Topics  . Alcohol use: No  . Drug use: No     Allergies   Aspirin   Review of Systems Review of Systems  Constitutional: Negative for chills and fever.   HENT: Positive for congestion, postnasal drip, rhinorrhea and sinus pressure. Negative for ear discharge, ear pain, facial swelling, sore throat and trouble swallowing.   Eyes: Negative for visual disturbance.  Respiratory: Negative for shortness of breath.   Cardiovascular: Negative for chest pain.  Gastrointestinal: Negative for abdominal pain.  Genitourinary: Negative for difficulty urinating.  Musculoskeletal: Negative for gait problem.  Skin: Negative for rash.  Neurological: Negative for dizziness, weakness, numbness (intermittent burning and tingling throughout body) and headaches.  Psychiatric/Behavioral: Negative for agitation.     Physical Exam Updated Vital Signs BP (!) 142/89 (BP Location: Right Arm)   Pulse 76   Temp 98.4 F (36.9 C) (Oral)   Resp 17   SpO2 100%   Physical Exam  Constitutional: She is oriented to person, place, and time. She appears well-developed and well-nourished. No distress.  Sitting at bedside in no apparent distress, nontoxic-appearing.  HENT:  Head: Normocephalic and atraumatic.  Nasal turbinates boggy.  Tenderness over bilateral maxillary sinuses. No facial edema.  Bilateral TMs with good cone of light.  Mucous membranes moist.  Posterior oropharynx without erythema.  No tonsillar swelling or exudate.  Uvula midline.  Eyes: Pupils are equal, round, and reactive to light. Conjunctivae are normal. Right eye exhibits no discharge. Left eye exhibits no discharge.  Neck: Normal range of motion. Neck supple.  Cardiovascular: Normal rate, regular rhythm and intact distal pulses.  No murmur heard. Pulmonary/Chest: Effort normal and breath sounds normal. No stridor. No respiratory distress. She has no wheezes. She has no rales.  Musculoskeletal: Normal range of motion.  Neurological: She is alert and oriented to person, place, and time. Coordination normal.  Mental Status:  Alert, oriented, thought content appropriate, able to give a coherent  history. Speech fluent without evidence of aphasia. Able to follow 2 step commands without difficulty.  Cranial Nerves:  II:  Peripheral visual fields grossly normal, pupils equal, round, reactive to light III,IV, VI: ptosis not present, extra-ocular motions intact bilaterally  V,VII: smile symmetric, facial light touch sensation equal VIII: hearing grossly normal to voice  X: uvula elevates symmetrically  XI: bilateral shoulder shrug symmetric and strong XII: midline tongue extension without fassiculations Motor:  Normal tone. 5/5 in upper and lower extremities bilaterally including strong and equal grip strength and dorsiflexion/plantar flexion Sensory: Pinprick and light touch normal in all extremities.  Deep Tendon Reflexes: 2+ and symmetric in the biceps and patella Cerebellar: normal finger-to-nose with bilateral upper extremities Gait: normal gait and balance  Skin: Skin is warm and dry. Capillary refill takes less than 2 seconds. She is not  diaphoretic.  Psychiatric: She has a normal mood and affect. Her behavior is normal.  Nursing note and vitals reviewed.    ED Treatments / Results  Labs (all labs ordered are listed, but only abnormal results are displayed) Labs Reviewed - No data to display  EKG None  Radiology No results found.  Procedures Procedures (including critical care time)  Medications Ordered in ED Medications - No data to display   Initial Impression / Assessment and Plan / ED Course  I have reviewed the triage vital signs and the nursing notes.  Pertinent labs & imaging results that were available during my care of the patient were reviewed by me and considered in my medical decision making (see chart for details).     Presents with multiple complaints  Sinusitis Mild to moderate symptoms of clear/yellow nasal discharge/congestion for less than 10 days. Patient is afebrile. No concern for acute bacterial rhinosinusitis; likely viral in nature.  Patient discharged with symptomatic treatment.  Patient instructions given for warm saline nasal washes.  Recommendations for follow-up with primary care physician.    Intermittent tingling/burning sensation Occurs for 2 seconds at a time and has happened once a day for the past three days. She denies associated focal numbness, weakness, dizziness, visual disturbance. No neurological deficits on exam. Do not suspect CVA or TIA based on history and exam findings. Orthostatic vital signs stable. Patient able to ambulate independently without difficulty. Given symptoms intermittent and occurring with standing, likely orthostatic. Doubt electrolyte imbalance given symptoms intermittent. Do not think further workup is indicated at this time. Patient denies symptoms. Discussed importance of fluid hydration and standing up slowly. Discussed reasons to return to the ED and pt agrees and verbalizes understanding.    Final Clinical Impressions(s) / ED Diagnoses   Final diagnoses:  Acute recurrent maxillary sinusitis    ED Discharge Orders        Ordered    fluticasone (FLONASE) 50 MCG/ACT nasal spray  Daily     11/22/17 1611    cetirizine (ZYRTEC ALLERGY) 10 MG tablet  Daily     11/22/17 1611       Glyn Ade, PA-C 11/22/17 1745    Charlesetta Shanks, MD 11/27/17 1204

## 2017-11-22 NOTE — ED Provider Notes (Addendum)
Patient placed in Quick Look pathway, seen and evaluated   Chief Complaint: Tingling  HPI:   For the past 2 to 3 days, patient has had a sensation of "tingling washing over the body," intermittent, lasts for 2 to 3 seconds at a time.  Also complains of nasal congestion, "head stuffiness," and maxillary sinus tenderness.  Denies LOC, numbness, weakness, vision abnormality, fever, vomiting.  ROS: Tingling (one)  Physical Exam:   Gen: No distress  Neuro: Awake and Alert  Skin: Warm    Focused Exam:   No diaphoresis.  No pallor.  HEENT: Some minor mucosal edema in the bilateral nares.  Tenderness over the bilateral maxillary sinuses.  No noted facial swelling.  Pulmonary: No increased work of breathing.  Speaks in full sentences without difficulty. No tachypnea.  Cardiac: Normal rate and regular.  Neurologic:  No sensory deficits. Strength 5/5 in all extremities. No gait disturbance. Coordination intact. Cranial nerves III-XII grossly intact. No facial droop.    Suspicion for sinusitis.  Initiation of care has begun. The patient has been counseled on the process, plan, and necessity for staying for the completion/evaluation, and the remainder of the medical screening examination   Lorayne Bender, PA-C 11/22/17 [place], Helane Gunther, PA-C 11/22/17 1426    Duffy Bruce, MD 11/23/17 1310

## 2017-12-11 ENCOUNTER — Ambulatory Visit: Payer: Medicaid Other | Admitting: Internal Medicine

## 2017-12-11 NOTE — Progress Notes (Deleted)
Patient ID: [name], female   DOB: [birth year], 57 y.o.   MRN: [id number]    HPI  [name] is a 57 y.o.-year-old female, referred by her PCP, Dr. Gar Ponto, for management of hypothyroidism and thyroid nodule.  Pt. has been dx with hypothyroidism in *** >> on Levothyroxine 150 mcg.  She takes the thyroid hormone: - fasting - with water - separated by >30 min from b'fast  - no calcium, iron, PPIs, multivitamins   I reviewed pt's thyroid tests: 08/17/2017: TSH 0.236 Lab Results  Component Value Date   TSH 42.838 (H) 07/03/2017   Antithyroid antibodies: No results found for: THGAB No components found for: TPOAB  She also has a history of a thyroid nodule that has been followed in the past by ultrasound and FNAs:  Reviewed latest thyroid ultrasound report from 10/04/2017: Thyroid contains a large nodule, which has increased in size compared to previous ultrasound in 04/2014: Parenchymal Echotexture: Markedly heterogenous Isthmus: 0.3 cm, previously 0.3 cm Right lobe: 1.6 x 0.7 x 0.6 cm, previously 2.3 x 0.9 x 0.6 cm Left lobe: 4.9 x 4.2 x 4.6 cm, previously 4.6 x 3.1 x 3.4 cm  Complex left lobe nodule 1 measures 4.9 x 3.9 x 3.9 cm and previously measured 3.6 x 2.9 x 3.0 cm. Biopsy has been performed Previously.  I also reviewed previous FNA results and they were inconclusive 2x:  FNA (09/15/2013): Atypia of unknown significance (Bethesda category 3) FNA (01/14/2014): Follicular lesion of unknown significance (Bethesda category 3).  The cells contained focal Hurthle cell changes: THE SPECIMEN IS HYPERCELLULAR AND CONSISTS OF SMALL TO MEDIUM SIZED GROUPS OF FOLLICULAR EPITHELIAL CELLS WITH MILD CYTOLOGIC ATYPIA INCLUDING NUCLEOMEGALY AND SCATTERED INTRANUCLEAR GROOVES. THERE IS FOCAL HURTHLE CELL CHANGE. THERE IS MINIMAL BACKGROUND COLLOID. BASED ON THESE FEATURES, A FOLLICULAR LESION/NEOPLASM CAN NOT BE ENTIRELY RULED OUT.  At that time, she was advised that the  biopsies are benign.  Pt describes: - weight gain - fatigue - cold intolerance - depression - constipation - dry skin - hair loss  Pt denies feeling nodules in neck, hoarseness, dysphagia/odynophagia, SOB with lying down.  She has + FH of thyroid disorders in: ***. No FH of thyroid cancer.  No h/o radiation tx to head or neck. No recent use of iodine supplements.  Pt. also has a history of ***.  ROS: Constitutional: no weight gain/loss, no fatigue, no subjective hyperthermia/hypothermia Eyes: no blurry vision, no xerophthalmia ENT: no sore throat, no nodules palpated in throat, no dysphagia/odynophagia, no hoarseness Cardiovascular: no CP/SOB/palpitations/leg swelling Respiratory: no cough/SOB Gastrointestinal: no N/V/D/C Musculoskeletal: no muscle/joint aches Skin: no rashes Neurological: no tremors/numbness/tingling/dizziness Psychiatric: no depression/anxiety  PE: There were no vitals taken for this visit. Wt Readings from Last 3 Encounters:  09/30/17 213 lb (96.6 kg)  09/12/17 213 lb (96.6 kg)  07/30/17 215 lb (97.5 kg)   Constitutional: overweight, in NAD Eyes: PERRLA, EOMI, no exophthalmos ENT: moist mucous membranes, no thyromegaly, no cervical lymphadenopathy Cardiovascular: RRR, No MRG Respiratory: CTA B Gastrointestinal: abdomen soft, NT, ND, BS+ Musculoskeletal: no deformities, strength intact in all 4 Skin: moist, warm, no rashes Neurological: no tremor with outstretched hands, DTR normal in all 4  ASSESSMENT: 1. Hypothyroidism  2. Thyroid nodule  PLAN:  1. Patient with recently diagnosed  hypothyroidism, on levothyroxine therapy.  She is on LT4 150 mcg daily. - she appears euthyroid, however, latest TSH was suppressed, at 0.23 in 08/2017 - We discussed about correct intake of levothyroxine, fasting,  with water, separated by at least 30 minutes from breakfast, and separated by more than 4 hours from calcium, iron, multivitamins, acid reflux  medications (PPIs). - will check thyroid tests today: TSH, free T4 - If labs today are abnormal, she will need to return in ~6 weeks for repeat labs - Otherwise, I will see her back in 4 months  2. Thyroid nodule - I reviewed the images of her thyroid ultrasound along with the patient. I pointed out that the dominant nodule is large, this being a risk factor for cancer.  Otherwise, the nodules is: - not hypoechoic - without microcalcifications - without internal blood flow - more wide than tall - well delimited from surrounding tissue Pt does not have a thyroid cancer family history or a personal history of RxTx to head/neck. All these would favor benignity.  - I explained that the previous biopsies are inconclusive and suggested to have another biopsy with Afirma testing.  If this is still inconclusive, she will need to have lobectomy. - patient decided to have the FNA done now >> I ordered this.  - I did explain that, while thyroid surgery is not a complicated one, it still can have side effects and also she might have a risk of ~25% of becoming hypothyroid after hemithyroidectomy.  - I'll see her back in 4 months. - I advised pt to join my chart and I will send her the results through there   Philemon Kingdom, MD PhD [facility] Endocrinology   Philemon Kingdom, MD PhD [facility] Endocrinology

## 2018-01-01 ENCOUNTER — Other Ambulatory Visit: Payer: Self-pay

## 2018-01-01 ENCOUNTER — Inpatient Hospital Stay: Payer: Medicaid Other

## 2018-01-01 ENCOUNTER — Inpatient Hospital Stay: Payer: Medicaid Other | Attending: Hematology & Oncology | Admitting: Hematology & Oncology

## 2018-01-01 VITALS — BP 136/80 | HR 68 | Temp 98.0°F | Resp 18 | Wt 201.0 lb

## 2018-01-01 DIAGNOSIS — I890 Lymphedema, not elsewhere classified: Secondary | ICD-10-CM | POA: Diagnosis not present

## 2018-01-01 DIAGNOSIS — M818 Other osteoporosis without current pathological fracture: Secondary | ICD-10-CM

## 2018-01-01 DIAGNOSIS — C50912 Malignant neoplasm of unspecified site of left female breast: Secondary | ICD-10-CM

## 2018-01-01 DIAGNOSIS — E042 Nontoxic multinodular goiter: Secondary | ICD-10-CM | POA: Diagnosis not present

## 2018-01-01 DIAGNOSIS — E032 Hypothyroidism due to medicaments and other exogenous substances: Secondary | ICD-10-CM

## 2018-01-01 DIAGNOSIS — Z853 Personal history of malignant neoplasm of breast: Secondary | ICD-10-CM

## 2018-01-01 LAB — CMP (CANCER CENTER ONLY)
ALBUMIN: 4 g/dL (ref 3.5–5.0)
ALT: 18 U/L (ref 0–44)
AST: 22 U/L (ref 15–41)
Alkaline Phosphatase: 90 U/L (ref 38–126)
Anion gap: 10 (ref 5–15)
BUN: 11 mg/dL (ref 6–20)
CO2: 26 mmol/L (ref 22–32)
CREATININE: 0.77 mg/dL (ref 0.44–1.00)
Calcium: 9.5 mg/dL (ref 8.9–10.3)
Chloride: 103 mmol/L (ref 98–111)
Glucose, Bld: 100 mg/dL — ABNORMAL HIGH (ref 70–99)
Potassium: 3.9 mmol/L (ref 3.5–5.1)
Sodium: 139 mmol/L (ref 135–145)
Total Bilirubin: 0.2 mg/dL — ABNORMAL LOW (ref 0.3–1.2)
Total Protein: 7.7 g/dL (ref 6.5–8.1)

## 2018-01-01 LAB — CBC WITH DIFFERENTIAL (CANCER CENTER ONLY)
BASOS PCT: 0 %
Basophils Absolute: 0 10*3/uL (ref 0.0–0.1)
Eosinophils Absolute: 0.1 10*3/uL (ref 0.0–0.5)
Eosinophils Relative: 2 %
HEMATOCRIT: 36.5 % (ref 34.8–46.6)
Hemoglobin: 12.2 g/dL (ref 11.6–15.9)
Lymphocytes Relative: 39 %
Lymphs Abs: 1.9 10*3/uL (ref 0.9–3.3)
MCH: 27.4 pg (ref 26.0–34.0)
MCHC: 33.4 g/dL (ref 32.0–36.0)
MCV: 82 fL (ref 81.0–101.0)
MONO ABS: 0.3 10*3/uL (ref 0.1–0.9)
MONOS PCT: 7 %
NEUTROS ABS: 2.5 10*3/uL (ref 1.5–6.5)
Neutrophils Relative %: 52 %
Platelet Count: 228 10*3/uL (ref 145–400)
RBC: 4.45 MIL/uL (ref 3.70–5.32)
RDW: 13.4 % (ref 11.1–15.7)
WBC Count: 4.9 10*3/uL (ref 3.9–10.0)

## 2018-01-01 LAB — TSH: TSH: 0.088 u[IU]/mL — AB (ref 0.308–3.960)

## 2018-01-01 NOTE — Progress Notes (Signed)
Hematology and Oncology Follow Up Visit  [name] [id number] 1960/10/21 58 y.o. 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  We saw her in January.  Since then, she is been doing okay.  She still is having some thyroid issues.  She has a goiter.  She sees an endocrinologist for this.  It sounds like she is going to receive radioactive iodine.  She had a mammogram in January of the right breast.  Mammogram looked okay.  She has had no problems with bowels or bladder.  She is losing weight which she is happy about.  Her graph she does have a lymphedema sleeve for the left arm.  Its tough for her to wear this in the summertime with the heat.  She has had no rashes.  She has had no leg swelling.  Overall, her performance status is ECOG 1.   Medications:  Current Outpatient Medications:  .  acetaminophen (TYLENOL) 500 MG tablet, Take 500 mg by mouth every 6 (six) hours as needed for moderate pain., Disp: , Rfl:  .  albuterol (PROVENTIL HFA) 108 (90 Base) MCG/ACT inhaler, INHALE 2 PUFFS BY MOUTH INTO THE LUNGS EVERY 4 HOURS AS NEEDED FOR WHEEZING OR SHORTNESS OF BREATH, Disp: 6.7 g, Rfl: 3 .  cetirizine (ZYRTEC ALLERGY) 10 MG tablet, Take 1 tablet (10 mg total) by mouth daily., Disp: 30 tablet, Rfl: 0 .  fluticasone (FLONASE) 50 MCG/ACT nasal spray, Place 2 sprays into both nostrils daily., Disp: 16 g, Rfl: 2 .  fluticasone (FLONASE) 50 MCG/ACT nasal spray, Place 1 spray into both nostrils daily., Disp: 16 g, Rfl: 0 .  ibuprofen (ADVIL,MOTRIN) 600 MG tablet, Take 1 tablet (600 mg total) by mouth every 6 (six) hours as needed., Disp: 30 tablet, Rfl: 0 .  levothyroxine (SYNTHROID, LEVOTHROID) 150 MCG tablet, TAKE ONE TABLET BY MOUTH ONCE DAILY BEFORE BREAKFAST (Patient taking differently:  TAKE 150 mcg TABLET BY MOUTH ONCE DAILY BEFORE BREAKFAST), Disp: 30 tablet, Rfl: 0 .  lisinopril-hydrochlorothiazide (PRINZIDE,ZESTORETIC) 10-12.5 MG per tablet, Take 1 tablet by mouth daily., Disp: 30 tablet, Rfl: 2 .  loratadine (CLARITIN) 10 MG tablet, Take 1 tablet (10 mg total) by mouth daily., Disp: 10 tablet, Rfl: 0 .  omeprazole (PRILOSEC) 20 MG capsule, Take 1 capsule (20 mg total) by mouth daily., Disp: 30 capsule, Rfl: 6 .  UNABLE TO FIND, Lymphedema sleeve for L arm, Disp: 1 Units, Rfl: 0  Allergies:  Allergies  Allergen Reactions  . Aspirin Swelling    Past Medical History, Surgical history, Social history, and Family History were reviewed and updated.  Review of Systems: Review of Systems  Constitutional: Negative.   HENT:  Negative.   Eyes: Negative.   Respiratory: Positive for shortness of breath.   Cardiovascular: Negative.   Gastrointestinal: Negative.   Endocrine: Negative.   Genitourinary: Negative.    Musculoskeletal: Positive for myalgias.  Skin: Negative.   Neurological: Negative.   Hematological: Negative.   Psychiatric/Behavioral: Negative.     Physical Exam:  weight is 201 lb (91.2 kg). Her oral temperature is 98 F (36.7 C). Her blood pressure is 136/80 and her pulse is 68. Her respiration is 18 and oxygen saturation is 100%.   Wt Readings from Last 3 Encounters:  01/01/18 201 lb (91.2 kg)  09/30/17  213 lb (96.6 kg)  09/12/17 213 lb (96.6 kg)    Physical Exam  Constitutional: She is oriented to person, place, and time.  HENT:  Head: Normocephalic and atraumatic.  Mouth/Throat: Oropharynx is clear and moist.  Eyes: Pupils are equal, round, and reactive to light. EOM are normal.  Neck: Normal range of motion.  Cardiovascular: Normal rate, regular rhythm and normal heart sounds.  Pulmonary/Chest: Effort normal and breath sounds normal.  Abdominal: Soft. Bowel sounds are normal.  Musculoskeletal: Normal range of motion. She exhibits no edema,  tenderness or deformity.  Lymphadenopathy:    She has no cervical adenopathy.  Neurological: She is alert and oriented to person, place, and time.  Skin: Skin is warm and dry. No rash noted. No erythema.  Psychiatric: She has a normal mood and affect. Her behavior is normal. Judgment and thought content normal.  Vitals reviewed.    Lab Results  Component Value Date   WBC 4.9 01/01/2018   HGB 12.2 01/01/2018   HCT 36.5 01/01/2018   MCV 82.0 01/01/2018   PLT 228 01/01/2018     Chemistry      Component Value Date/Time   NA 141 09/30/2017 1656   NA 139 12/15/2014 0834   K 3.3 (L) 09/30/2017 1656   K 4.1 12/15/2014 0834   CL 104 09/30/2017 1648   CL 101 10/28/2013 1150   CO2 25 09/30/2017 1648   CO2 22 12/15/2014 0834   BUN 14 09/30/2017 1648   BUN 13.0 12/15/2014 0834   CREATININE 0.91 09/30/2017 1648   CREATININE 1.00 07/03/2017 1404   CREATININE 0.8 12/15/2014 0834      Component Value Date/Time   CALCIUM 9.7 09/30/2017 1648   CALCIUM 9.0 12/15/2014 0834   ALKPHOS 78 09/30/2017 1648   ALKPHOS 81 12/15/2014 0834   AST 21 09/30/2017 1648   AST 18 07/03/2017 1404   AST 16 12/15/2014 0834   ALT 20 09/30/2017 1648   ALT 19 07/03/2017 1404   ALT 13 12/15/2014 0834   BILITOT 0.3 09/30/2017 1648   BILITOT <0.2 (L) 07/03/2017 1404   BILITOT 0.31 12/15/2014 0834         Impression and Plan: [name] is a 57 year old postmenopausal African-American female.  She has had a remote history of recurrent adenocarcinoma of the left breast.  She underwent radiation for this recurrence after surgery.  As far as her breast cancer is concerned, she has no obvious recurrent disease.  Everything looks fantastic.  I do not see any problems with her having the thyroid taking care of.  We will see her back in 6 more months.   Volanda Napoleon, MD 7/24/201911:06 AM

## 2018-01-02 ENCOUNTER — Telehealth: Payer: Self-pay | Admitting: *Deleted

## 2018-01-02 LAB — VITAMIN D 25 HYDROXY (VIT D DEFICIENCY, FRACTURES): VIT D 25 HYDROXY: 17.2 ng/mL — AB (ref 30.0–100.0)

## 2018-01-02 NOTE — Telephone Encounter (Addendum)
Patient is aware of results. She knows to start a vit D supplement of 2000 units a day. Also confirmed her endocrinologists name and labs routed.    ----- Message from Volanda Napoleon, MD sent at 01/01/2018 12:19 PM EDT ----- Call - the chemistry labs are normal. Lattie Haw, Rudell Cobb, MD  Cordelia Poche, RN        Please send these results to her endocrinologist. Laurey Arrow    Notes recorded by Volanda Napoleon, MD on 01/02/2018 at 5:40 AM EDT Call - Vit D is very low. Need to take 2000 units a day. pete

## 2018-01-18 ENCOUNTER — Encounter (HOSPITAL_COMMUNITY): Payer: Self-pay | Admitting: *Deleted

## 2018-01-18 ENCOUNTER — Other Ambulatory Visit: Payer: Self-pay

## 2018-01-18 ENCOUNTER — Ambulatory Visit (HOSPITAL_COMMUNITY)
Admission: EM | Admit: 2018-01-18 | Discharge: 2018-01-18 | Disposition: A | Discharge status: Home / Self Care | Payer: Medicaid Other | Attending: Internal Medicine | Admitting: Internal Medicine

## 2018-01-18 DIAGNOSIS — J029 Acute pharyngitis, unspecified: Secondary | ICD-10-CM

## 2018-01-18 DIAGNOSIS — J069 Acute upper respiratory infection, unspecified: Secondary | ICD-10-CM

## 2018-01-18 DIAGNOSIS — R51 Headache: Secondary | ICD-10-CM

## 2018-01-18 MED ORDER — DEXAMETHASONE SODIUM PHOSPHATE 10 MG/ML IJ SOLN
10.0000 mg | Freq: Once | INTRAMUSCULAR | Status: AC
  Administered 2018-01-18: 10 mg via INTRAMUSCULAR

## 2018-01-18 MED ORDER — AMOXICILLIN-POT CLAVULANATE 875-125 MG PO TABS: 1.0000 | ORAL_TABLET | Freq: Two times a day (BID) | ORAL | 0 refills | Status: DC

## 2018-01-18 MED ORDER — DEXAMETHASONE SODIUM PHOSPHATE 10 MG/ML IJ SOLN
INTRAMUSCULAR | Status: AC
  Filled 2018-01-18: qty 1

## 2018-01-18 NOTE — ED Triage Notes (Addendum)
C/O left earache, sinus congestion and pressure and slight dizziness over past couple days.

## 2018-01-18 NOTE — ED Provider Notes (Signed)
[name]    CSN: [id number] Arrival date & time: 01/18/18  1701     History   Chief Complaint Chief Complaint  Patient presents with  . Nasal Congestion  . Otalgia    HPI [name] is a 57 y.o. female.   57 year old female presents with intermittent headache sinus congestion and earache was 1 week.  Edition is acute nature.  She has been better by nothing.  Condition is made worse by nothing.  Patient denies any fever nausea vomiting diarrhea or sick contacts.  Patient also reports a history of "paresthesia" that woke her up from sleep last night and numbness to her ring and middle finger on her left hand side no known trauma     Past Medical History:  Diagnosis Date  . Acid reflux   . Cancer [facility])    breast cancer  . Essential hypertension 02/16/2015  . Hypertension   . Obesity 02/16/2015  . Thyroid disease   . Vertigo     Patient Active Problem List   Diagnosis Date Noted  . Essential hypertension 02/16/2015  . Obesity 02/16/2015  . Acute sinus infection 06/09/2014  . Adenocarcinoma of left breast ([place]) 10/27/2013    Past Surgical History:  Procedure Laterality Date  . ABDOMINAL HYSTERECTOMY    . ESOPHAGEAL DILATION    . MASTECTOMY Left 2007  . MASTECTOMY      OB History    Gravida  2   Para  2   Term  2   Preterm      AB      Living  2     SAB      TAB      Ectopic      Multiple      Live Births               Home Medications    Prior to Admission medications   Medication Sig Start Date End Date Taking? Authorizing Provider  levothyroxine (SYNTHROID, LEVOTHROID) 150 MCG tablet TAKE ONE TABLET BY MOUTH ONCE DAILY BEFORE BREAKFAST Patient taking differently: TAKE 150 mcg TABLET BY MOUTH ONCE DAILY BEFORE BREAKFAST 06/24/15  Yes Ennever, Rudell Cobb, MD  lisinopril-hydrochlorothiazide (PRINZIDE,ZESTORETIC) 10-12.5 MG per tablet Take 1 tablet by mouth daily. 03/01/15  Yes Volanda Napoleon, MD  omeprazole (PRILOSEC)  20 MG capsule Take 1 capsule (20 mg total) by mouth daily. 09/09/15  Yes Volanda Napoleon, MD    Family History Family History  Problem Relation Age of Onset  . Asthma Mother   . Diabetes Father   . Breast cancer Paternal Aunt     Social History Social History   Tobacco Use  . Smoking status: Never Smoker  . Smokeless tobacco: Never Used  . Tobacco comment: never used tobacco  Substance Use Topics  . Alcohol use: No  . Drug use: No     Allergies   Aspirin   Review of Systems Review of Systems  Constitutional: Positive for fatigue. Negative for chills and fever.  HENT: Positive for congestion, ear pain and sore throat.   Eyes: Negative for pain and visual disturbance.  Respiratory: Negative for cough and shortness of breath.   Cardiovascular: Negative for chest pain and palpitations.  Gastrointestinal: Negative for abdominal pain and vomiting.  Genitourinary: Negative for dysuria and hematuria.  Musculoskeletal: Negative for arthralgias and back pain.  Skin: Negative for color change and rash.  Neurological: Negative for seizures and syncope.  All other systems reviewed  and are negative.    Physical Exam Triage Vital Signs ED Triage Vitals  Enc Vitals Group     BP 01/18/18 1802 136/83     Pulse Rate 01/18/18 1802 91     Resp 01/18/18 1802 16     Temp 01/18/18 1802 98.1 F (36.7 C)     Temp Source 01/18/18 1802 Oral     SpO2 01/18/18 1802 100 %     Weight --      Height --      Head Circumference --      Peak Flow --      Pain Score 01/18/18 1803 4     Pain Loc --      Pain Edu? --      Excl. in [place]? --    No data found.  Updated Vital Signs BP 136/83   Pulse 91   Temp 98.1 F (36.7 C) (Oral)   Resp 16   SpO2 100%   Visual Acuity Right Eye Distance:   Left Eye Distance:   Bilateral Distance:    Right Eye Near:   Left Eye Near:    Bilateral Near:     Physical Exam  Constitutional: She is oriented to person, place, and time. She appears  well-developed and well-nourished.  HENT:  Head: Normocephalic and atraumatic.  Eyes: Conjunctivae are normal.  Neck: Normal range of motion.  Pulmonary/Chest: Effort normal.  Musculoskeletal: Normal range of motion.  Neurological: She is alert and oriented to person, place, and time.  Skin: Skin is warm.  Psychiatric: She has a normal mood and affect.  Nursing note and vitals reviewed.    UC Treatments / Results  Labs (all labs ordered are listed, but only abnormal results are displayed) Labs Reviewed - No data to display  EKG None  Radiology No results found.  Procedures Procedures (including critical care time)  Medications Ordered in UC Medications - No data to display  Initial Impression / Assessment and Plan / UC Course  I have reviewed the triage vital signs and the nursing notes.  Pertinent labs & imaging results that were available during my care of the patient were reviewed by me and considered in my medical decision making (see chart for details).      Final Clinical Impressions(s) / UC Diagnoses   Final diagnoses:  None   Discharge Instructions   None    ED Prescriptions    None     Controlled Substance Prescriptions [place] Controlled Substance Registry consulted? Not Applicable   Jacqualine Mau, NP 01/18/18 [phone]

## 2018-01-18 NOTE — Discharge Instructions (Addendum)
Continue to push fluids and take over the counter medications as directed on the back of the box for symptomatic relief.  ° °

## 2018-01-24 ENCOUNTER — Emergency Department (HOSPITAL_COMMUNITY)
Admission: EM | Admit: 2018-01-24 | Discharge: 2018-01-24 | Disposition: A | Discharge status: Home / Self Care | Payer: Medicaid Other | Attending: Emergency Medicine | Admitting: Emergency Medicine

## 2018-01-24 ENCOUNTER — Encounter (HOSPITAL_COMMUNITY): Payer: Self-pay | Admitting: Emergency Medicine

## 2018-01-24 ENCOUNTER — Other Ambulatory Visit: Payer: Self-pay

## 2018-01-24 DIAGNOSIS — Z5321 Procedure and treatment not carried out due to patient leaving prior to being seen by health care provider: Secondary | ICD-10-CM | POA: Diagnosis not present

## 2018-01-24 DIAGNOSIS — R51 Headache: Secondary | ICD-10-CM | POA: Insufficient documentation

## 2018-01-24 NOTE — ED Notes (Signed)
Follow up call made  No answer  01/24/18  1414  s Stevenson Windmiller rn

## 2018-01-24 NOTE — ED Triage Notes (Signed)
Pt states that right before it got dark tonight began having a "nagging" pain to the back of her head. Has had recent tx for URI.No fevers, chills.

## 2018-02-06 ENCOUNTER — Ambulatory Visit: Payer: Medicaid Other | Admitting: Internal Medicine

## 2018-02-06 DIAGNOSIS — Z0289 Encounter for other administrative examinations: Secondary | ICD-10-CM

## 2018-02-06 NOTE — Progress Notes (Deleted)
Patient ID: [name], female   DOB: 23-May-1961, 57 y.o.   MRN: [id number]    HPI  [name] is a 57 y.o.-year-old female, referred by her PCP, Dr. Quillian Quince, for evaluation and management of a Left thyroid nodule and uncontrolled hypothyroidism.  She was found to have a left thyroid nodule in 2015.  Reviewed previous investigation:  Thyroid ultrasound (09/08/2013): 4.1 x 3.2 x 3.5 cm complex solid and cystic mass is present in the lower pole. There is primarily peripheral vascularity.  Thyroid ultrasound (04/28/2014): Dominant nodule on the left with partially cystic and partially solid components. Current measurement 3.6 cm x 2.9 cm x 3.0 cm. This is slightly smaller than the previous measurement, status post aspiration.  At that time, she had 2 biopsies of the nodule which were both inconclusive: 09/15/2013: Atypia of undetermined significance (AUS) 01/14/2014: Follicular lesion of undetermined significance (FLUS)  Thyroid ultrasound (10/04/2017) showed a complex left thyroid nodule, increased in size from previous ultrasound from 2015: Currently 4.9 x 3.9 x 3.9 cm  Pt denies: - feeling nodules in neck - hoarseness - dysphagia - choking - SOB with lying down   Of note, patient also has a history of uncontrolled hypothyroidism and is on levothyroxine 150 mcg daily.  She takes the levothyroxine: - in am - fasting - at least 30 min from b'fast - no Ca, Fe, MVI, PPIs - not on Biotin  I reviewed pt's thyroid tests: Lab Results  Component Value Date   TSH 0.088 (L) 01/01/2018   TSH 42.838 (H) 07/03/2017    Pt denies: - fatigue - heat intolerance/cold intolerance - tremors - palpitations - anxiety/depression - hyperdefecation/constipation - weight loss/weight gain - dry skin - hair loss  No FH of thyroid ds. No FH of thyroid cancer. No h/o radiation tx to head or neck.  No seaweed or kelp. No recent contrast studies. No steroid use. No herbal supplements. No  Biotin supplements or Hair, Skin and Nails vitamins.  Pt also has a history of stable left vocal cord paralysis.  ROS: Constitutional: + See HPI  Eyes: no blurry vision, no xerophthalmia ENT: no sore throat,  + see HPI Cardiovascular: no CP/SOB/palpitations/leg swelling Respiratory: no cough/SOB Gastrointestinal: no N/V/D/C Musculoskeletal: no muscle/joint aches Skin: no rashes Neurological: no tremors/numbness/tingling/dizziness Psychiatric: no depression/anxiety  PE: There were no vitals taken for this visit. Wt Readings from Last 3 Encounters:  01/01/18 201 lb (91.2 kg)  09/30/17 213 lb (96.6 kg)  09/12/17 213 lb (96.6 kg)    Constitutional: overweight, in NAD Eyes: PERRLA, EOMI, no exophthalmos ENT: moist mucous membranes, no thyromegaly, no cervical lymphadenopathy Cardiovascular: RRR, No MRG Respiratory: CTA B Gastrointestinal: abdomen soft, NT, ND, BS+ Musculoskeletal: no deformities, strength intact in all 4;  Skin: moist, warm, no rashes Neurological: no tremor with outstretched hands, DTR normal in all 4  ASSESSMENT: 1. Thyroid nodule - left - complex  2.  Hypothyroidism  PLAN: 1. L Thyroid nodule - I reviewed the images of her thyroid ultrasound along with the patient. I pointed out that her thyroid nodule is large, this being a risk factor for cancer.  Otherwise, the nodules are: - not hypoechoic - without microcalcifications - without internal blood flow - more wide than tall - well delimited from surrounding tissue Pt does not have a thyroid cancer family history or a personal history of RxTx to head/neck. All these would favor benignity.  - the only way that we can tell exactly if it is  cancer or not is by doing a thyroid biopsy (FNA). I explained what the test entails. - patient decided to have the FNA done now >> I ordered this.  - I explained that this is not cancer, we can continue to follow her on a yearly basis, and check another ultrasound in  another year or 2. - she should let me know if she develops neck compression symptoms, in that case, we might need to do either lobectomy or thyroidectomy - I did explain that, while thyroid surgery is not a complicated one, it still can have side effects and also she might have a risk of ~25% of becoming hypothyroid after hemithyroidectomy.  - I'll see her back in a year, assuming her FNA is normal. If FNA abnormal, we will meet sooner.  - I advised pt to join my chart and I will send her the results through there   2. Hypothyroidism - latest thyroid labs reviewed with pt >> suppressed - she continues on LT4 150 mcg daily - pt feels good on this dose. - we discussed about taking the thyroid hormone every day, with water, >30 minutes before breakfast, separated by >4 hours from acid reflux medications, calcium, iron, multivitamins. Pt. is taking it correctly. - will check thyroid tests today: TSH and fT4 - If labs are abnormal, she will need to return for repeat TFTs in 1.5 months  Philemon Kingdom, MD PhD [facility] Endocrinology

## 2018-02-16 ENCOUNTER — Other Ambulatory Visit: Payer: Self-pay

## 2018-02-16 ENCOUNTER — Emergency Department (HOSPITAL_COMMUNITY)
Admission: EM | Admit: 2018-02-16 | Discharge: 2018-02-17 | Disposition: A | Discharge status: Home / Self Care | Payer: Medicaid Other | Attending: Emergency Medicine | Admitting: Emergency Medicine

## 2018-02-16 ENCOUNTER — Encounter (HOSPITAL_COMMUNITY): Payer: Self-pay | Admitting: Emergency Medicine

## 2018-02-16 ENCOUNTER — Emergency Department (HOSPITAL_COMMUNITY): Payer: Medicaid Other

## 2018-02-16 DIAGNOSIS — I10 Essential (primary) hypertension: Secondary | ICD-10-CM | POA: Diagnosis not present

## 2018-02-16 DIAGNOSIS — Z79899 Other long term (current) drug therapy: Secondary | ICD-10-CM | POA: Diagnosis not present

## 2018-02-16 DIAGNOSIS — Z9221 Personal history of antineoplastic chemotherapy: Secondary | ICD-10-CM | POA: Insufficient documentation

## 2018-02-16 DIAGNOSIS — R079 Chest pain, unspecified: Secondary | ICD-10-CM | POA: Diagnosis present

## 2018-02-16 DIAGNOSIS — E079 Disorder of thyroid, unspecified: Secondary | ICD-10-CM | POA: Insufficient documentation

## 2018-02-16 DIAGNOSIS — Z853 Personal history of malignant neoplasm of breast: Secondary | ICD-10-CM | POA: Diagnosis not present

## 2018-02-16 DIAGNOSIS — R0789 Other chest pain: Secondary | ICD-10-CM | POA: Insufficient documentation

## 2018-02-16 LAB — BASIC METABOLIC PANEL
Anion gap: 10 (ref 5–15)
BUN: 14 mg/dL (ref 6–20)
CALCIUM: 9.2 mg/dL (ref 8.9–10.3)
CO2: 24 mmol/L (ref 22–32)
CREATININE: 0.76 mg/dL (ref 0.44–1.00)
Chloride: 105 mmol/L (ref 98–111)
GFR calc non Af Amer: >60 mL/min (ref >60)
GLUCOSE: 102 mg/dL — AB (ref 70–99)
Potassium: 3.7 mmol/L (ref 3.5–5.1)
Sodium: 139 mmol/L (ref 135–145)

## 2018-02-16 LAB — I-STAT BETA HCG BLOOD, ED (MC, WL, AP ONLY): I-stat hCG, quantitative: <5 m[IU]/mL (ref <5)

## 2018-02-16 LAB — I-STAT TROPONIN, ED: TROPONIN I, POC: 0.02 ng/mL (ref 0.00–0.08)

## 2018-02-16 LAB — CBC
HCT: 35.5 % — ABNORMAL LOW (ref 36.0–46.0)
Hemoglobin: 11.3 g/dL — ABNORMAL LOW (ref 12.0–15.0)
MCH: 27 pg (ref 26.0–34.0)
MCHC: 31.8 g/dL (ref 30.0–36.0)
MCV: 84.9 fL (ref 78.0–100.0)
PLATELETS: 272 10*3/uL (ref 150–400)
RBC: 4.18 MIL/uL (ref 3.87–5.11)
RDW: 13 % (ref 11.5–15.5)
WBC: 7.3 10*3/uL (ref 4.0–10.5)

## 2018-02-16 MED ORDER — ACETAMINOPHEN 325 MG PO TABS
650.0000 mg | ORAL_TABLET | Freq: Once | ORAL | Status: AC
  Administered 2018-02-16: 650 mg via ORAL
  Filled 2018-02-16: qty 2

## 2018-02-16 MED ORDER — METHOCARBAMOL 500 MG PO TABS
500.0000 mg | ORAL_TABLET | Freq: Once | ORAL | Status: DC
  Filled 2018-02-16: qty 1

## 2018-02-16 NOTE — ED Provider Notes (Signed)
[facility] DEPARTMENT Provider Note   CSN: [id number] Arrival date & time: 02/16/18  2023     History   Chief Complaint Chief Complaint  Patient presents with  . Chest Pain    HPI [name] is a 57 y.o. female with a past medical history of hypertension, obesity, breast cancer status post mastectomy and chemo therapy in 1995, who presents to ED for evaluation of left-sided chest pain for the past 4 hours.  States that the pain has been constant, describes as sharp and radiating to her left shoulder.  Pain is worse with palpation.  She states that she felt like "my muscle was swollen in the area or something."  Symptoms began while she was sitting at her friend's house.  She believes she may have pulled a muscle while wringing some close doing laundry.  She does not take any medicine help with her symptoms.  States that pain has improved since arrival in the ED.  Denies pleuritic chest pain, shortness of breath, cough, hemoptysis, leg swelling, recent surgeries, recent prolonged travel, palpitations, abdominal pain, vomiting, fever, use, tobacco, alcohol or other drug use.  Denies family history of heart disease.  HPI  Past Medical History:  Diagnosis Date  . Acid reflux   . Cancer [facility])    breast cancer  . Essential hypertension 02/16/2015  . Hypertension   . Obesity 02/16/2015  . Thyroid disease   . Vertigo     Patient Active Problem List   Diagnosis Date Noted  . Essential hypertension 02/16/2015  . Obesity 02/16/2015  . Acute sinus infection 06/09/2014  . Adenocarcinoma of left breast ([facility]) 10/27/2013    Past Surgical History:  Procedure Laterality Date  . ABDOMINAL HYSTERECTOMY    . ESOPHAGEAL DILATION    . MASTECTOMY Left 2007  . MASTECTOMY       OB History    Gravida  2   Para  2   Term  2   Preterm      AB      Living  2     SAB      TAB      Ectopic      Multiple      Live Births               Home  Medications    Prior to Admission medications   Medication Sig Start Date End Date Taking? Authorizing Provider  cholecalciferol (VITAMIN D) 1000 units tablet Take 1,000 Units by mouth daily.   Yes [provider]  fluticasone (FLONASE) 50 MCG/ACT nasal spray Place 1 spray into both nostrils daily as needed for allergies or rhinitis.   Yes [provider]  levothyroxine (SYNTHROID, LEVOTHROID) 150 MCG tablet TAKE ONE TABLET BY MOUTH ONCE DAILY BEFORE BREAKFAST Patient taking differently: Take 150 mcg by mouth daily before breakfast.  06/24/15  Yes Ennever, Rudell Cobb, MD  lisinopril-hydrochlorothiazide (PRINZIDE,ZESTORETIC) 10-12.5 MG per tablet Take 1 tablet by mouth daily. 03/01/15  Yes Volanda Napoleon, MD  omeprazole (PRILOSEC) 20 MG capsule Take 1 capsule (20 mg total) by mouth daily. 09/09/15  Yes Volanda Napoleon, MD    Family History Family History  Problem Relation Age of Onset  . Asthma Mother   . Diabetes Father   . Breast cancer Paternal Aunt     Social History Social History   Tobacco Use  . Smoking status: Never Smoker  . Smokeless tobacco: Never Used  . Tobacco  comment: never used tobacco  Substance Use Topics  . Alcohol use: No  . Drug use: No     Allergies   Aspirin   Review of Systems Review of Systems  Constitutional: Negative for appetite change, chills and fever.  HENT: Negative for ear pain, rhinorrhea, sneezing and sore throat.   Eyes: Negative for photophobia and visual disturbance.  Respiratory: Negative for cough, chest tightness, shortness of breath and wheezing.   Cardiovascular: Positive for chest pain. Negative for palpitations.  Gastrointestinal: Negative for abdominal pain, blood in stool, constipation, diarrhea, nausea and vomiting.  Genitourinary: Negative for dysuria, hematuria and urgency.  Musculoskeletal: Positive for myalgias.  Skin: Negative for rash.  Neurological: Negative for dizziness, weakness and  light-headedness.     Physical Exam Updated Vital Signs BP (!) 108/59   Pulse 66   Temp 98.4 F (36.9 C) (Oral)   Resp 13   SpO2 100%   Physical Exam  Constitutional: She appears well-developed and well-nourished. No distress.  Nontoxic-appearing and in no acute distress.  Speaking complete sentences without difficulty.  HENT:  Head: Normocephalic and atraumatic.  Nose: Nose normal.  Eyes: Pupils are equal, round, and reactive to light. Conjunctivae and EOM are normal. Right eye exhibits no discharge. Left eye exhibits no discharge. No scleral icterus.  Neck: Normal range of motion. Neck supple.  Cardiovascular: Normal rate, regular rhythm, normal heart sounds and intact distal pulses. Exam reveals no gallop and no friction rub.  No murmur heard. Pulmonary/Chest: Effort normal and breath sounds normal. No respiratory distress. She exhibits tenderness.    Abdominal: Soft. Bowel sounds are normal. She exhibits no distension. There is no tenderness. There is no guarding.  Musculoskeletal: Normal range of motion. She exhibits no edema.  No lower extremity edema, erythema or calf tenderness bilaterally.  Neurological: She is alert. She exhibits normal muscle tone. Coordination normal.  Skin: Skin is warm and dry. No rash noted.  Psychiatric: She has a normal mood and affect.  Nursing note and vitals reviewed.    ED Treatments / Results  Labs (all labs ordered are listed, but only abnormal results are displayed) Labs Reviewed  BASIC METABOLIC PANEL - Abnormal; Notable for the following components:      Result Value   Glucose, Bld 102 (*)    All other components within normal limits  CBC - Abnormal; Notable for the following components:   Hemoglobin 11.3 (*)    HCT 35.5 (*)    All other components within normal limits  I-STAT TROPONIN, ED  I-STAT BETA HCG BLOOD, ED ([facility], [facility], [facility] ONLY)  I-STAT TROPONIN, ED    EKG EKG Interpretation  Date/Time:  Sunday February 16 2018  20:31:50 EDT Ventricular Rate:  74 PR Interval:  144 QRS Duration: 82 QT Interval:  398 QTC Calculation: 441 R Axis:   29 Text Interpretation:  Normal sinus rhythm Nonspecific T wave abnormality Abnormal ECG No significant change since last tracing Confirmed by Pryor Curia [phone]) on 02/16/2018 11:17:18 PM   Radiology Dg Chest 2 View  Result Date: 02/16/2018 CLINICAL DATA:  Left chest and arm pain for the past hour. EXAM: CHEST - 2 VIEW COMPARISON:  09/13/2017. FINDINGS: Normal sized heart. Stable linear scarring on the left. Clear right lung. Stable postmastectomy changes on the left with left axillary surgical clips. Stable upper mediastinal surgical clips. Stable mild scoliosis. Minimal thoracic spine degenerative changes. IMPRESSION: No acute abnormality. Electronically Signed   By: Claudie Revering M.D.   On: 02/16/2018  21:38    Procedures Procedures (including critical care time)  Medications Ordered in ED Medications  methocarbamol (ROBAXIN) tablet 500 mg (has no administration in time range)  acetaminophen (TYLENOL) tablet 650 mg (650 mg Oral Given 02/16/18 2351)     Initial Impression / Assessment and Plan / ED Course  I have reviewed the triage vital signs and the nursing notes.  Pertinent labs & imaging results that were available during my care of the patient were reviewed by me and considered in my medical decision making (see chart for details).     57 year old female with past medical history of hypertension, obesity, breast cancer status post mastectomy and chemotherapy in 1995, presents to ED for evaluation of left-sided chest pain for the past 4 hours.  Pain is been constant, described as sharp and radiating to her left shoulder.  Pain worse with palpation.  Unsure if her muscles are "swollen" from the wringing close she was cleaning for laundry.  She did not take any medicine help with her symptoms.  On exam there is tenderness palpation of the left side of the chest.   Lungs are clear to auscultation bilaterally.  She is speaking complete sentences without difficulty.  She is afebrile.  Denies any pleuritic chest pain, shortness of breath, hemoptysis or infectious symptoms.  Troponin is negative x2.  EKG shows sinus rhythm with no significant changes from prior tracings.  CBC, BMP unremarkable. Low risk by Wells score.  Low risk by heart score.  She reports improvement in her symptoms with Tylenol given here.  Suspect that her symptoms are musculoskeletal in nature.  Will advise her to take Tylenol and use heating pad as needed.  Advised to return to ED for any severe worsening symptoms.  Portions of this note were generated with [employer]. Dictation errors may occur despite best attempts at proofreading.  Final Clinical Impressions(s) / ED Diagnoses   Final diagnoses:  Chest wall pain    ED Discharge Orders    None       Delia Heady, PA-C 02/17/18 [place]    Noemi Chapel, MD 02/18/18 [phone]

## 2018-02-16 NOTE — ED Triage Notes (Signed)
1 hour ago pt was sitting at friend's house and had sharp L sided chest pain that lasted for a few seconds.  States L chest now just feels "weird".  Denies sob, nausea, and vomiting.

## 2018-02-17 LAB — I-STAT TROPONIN, ED: TROPONIN I, POC: 0 ng/mL (ref 0.00–0.08)

## 2018-02-17 NOTE — Discharge Instructions (Addendum)
You can take Tylenol or use a heating pad to help with your chest wall pain. Return to ED for worsening pain, trouble breathing or trouble swallowing, vomiting or coughing up blood.

## 2018-03-10 ENCOUNTER — Encounter: Payer: Self-pay | Admitting: Internal Medicine

## 2018-03-10 ENCOUNTER — Telehealth: Payer: Self-pay

## 2018-03-10 ENCOUNTER — Ambulatory Visit (INDEPENDENT_AMBULATORY_CARE_PROVIDER_SITE_OTHER): Payer: Medicaid Other | Admitting: Internal Medicine

## 2018-03-10 VITALS — BP 128/90 | HR 82 | Ht 64.17 in | Wt 202.0 lb

## 2018-03-10 DIAGNOSIS — E890 Postprocedural hypothyroidism: Secondary | ICD-10-CM | POA: Insufficient documentation

## 2018-03-10 DIAGNOSIS — E041 Nontoxic single thyroid nodule: Secondary | ICD-10-CM | POA: Diagnosis not present

## 2018-03-10 DIAGNOSIS — Z23 Encounter for immunization: Secondary | ICD-10-CM | POA: Diagnosis not present

## 2018-03-10 LAB — TSH: TSH: <0.01 u[IU]/mL — ABNORMAL LOW (ref 0.35–4.50)

## 2018-03-10 LAB — T4, FREE: Free T4: 1.58 ng/dL (ref 0.60–1.60)

## 2018-03-10 MED ORDER — LEVOTHYROXINE SODIUM 125 MCG PO TABS: 125.0000 ug | ORAL_TABLET | Freq: Every day | ORAL | 2 refills | Status: DC

## 2018-03-10 NOTE — Telephone Encounter (Signed)
Opened in error

## 2018-03-10 NOTE — Patient Instructions (Signed)
Please stop at the lab.  Take the thyroid hormone every day, with water, at least 30 minutes before breakfast, separated by at least 4 hours from: - acid reflux medications - calcium - iron - multivitamins  We will schedule a thyroid nodule biopsy and drainage at [facility] - please let me know if you are not called about this in 1 week.  Please come back for a follow-up appointment in 1 year.

## 2018-03-10 NOTE — Progress Notes (Signed)
Patient ID: [name], female   DOB: 1961-06-07, 57 y.o.   MRN: [id number]    HPI  [name] is a 56 y.o.-year-old female, referred by her PCP, Dr. Quillian Quince, for evaluation for thyroid nodules.  She was found to have a large thyroid nodule in 2015.  FNA (09/15/2013): AUS  FNA (01/14/2014): FLUS  Thyroid U/S (10/04/2017): Complex left lobe nodule 1 measures 4.9 x 3.9 x 3.9 cm and previously measured 3.6 x 2.9 x 3.0 cm. Biopsy has been performed previously.  Pt denies: - feeling nodules in neck - dysphagia - SOB with lying down  She does have occas.choking.  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  Hypothyroidism: - postablation - Not well controlled  Pt is on levothyroxine 150 mcg daily (decreased from 175 mcg in 12/2017), taken: - in am - fasting - at least 30 min from b'fast - no Ca, Fe, MVI, PPIs - not on Biotin  I reviewed pt's thyroid tests: Lab Results  Component Value Date   TSH 0.088 (L) 01/01/2018   TSH 42.838 (H) 07/03/2017    Pt mentions: - + fatigue - + hot flushes  Bot no: - tremors - palpitations - anxiety/depression - hyperdefecation/constipation - weight loss/weight gain - dry skin - hair loss  + FH of thyroid ds.in sister. No FH of thyroid cancer. No h/o radiation tx to head or neck.  No recent contrast studies. No steroid use. No herbal supplements. No Biotin supplements or Hair, Skin and Nails vitamins.  H/o BrCA 1997 - sees Dr. Marin Olp.  Also has a h/o HTN.  ROS: Constitutional: + See HPI  Eyes: no blurry vision, no xerophthalmia ENT: no sore throat,  + see HPI Cardiovascular: no CP/SOB/palpitations/leg swelling Respiratory: +cough/no SOB Gastrointestinal: no N/V/D/C Musculoskeletal: no muscle/+ joint aches Skin: no rashes, + easy brusing Neurological: no tremors/numbness/tingling/dizziness Psychiatric: no depression/anxiety  Past Medical History:  Diagnosis Date   . Acid reflux   . Cancer [facility])    breast cancer  . Essential hypertension 02/16/2015  . Hypertension   . Obesity 02/16/2015  . Thyroid disease   . Vertigo    Past Surgical History:  Procedure Laterality Date  . ABDOMINAL HYSTERECTOMY    . ESOPHAGEAL DILATION    . MASTECTOMY Left 2007  . MASTECTOMY     Social History   Socioeconomic History  . Marital status: Single    Spouse name: Not on file  . Number of children: Not on file  . Years of education: Not on file  . Highest education level: Not on file  Occupational History  . Not on file  Social Needs  . Financial resource strain: Not on file  . Food insecurity:    Worry: Not on file    Inability: Not on file  . Transportation needs:    Medical: Not on file    Non-medical: Not on file  Tobacco Use  . Smoking status: Never Smoker  . Smokeless tobacco: Never Used  . Tobacco comment: never used tobacco  Substance and Sexual Activity  . Alcohol use: No  . Drug use: No  . Sexual activity: Not on file  Lifestyle  . Physical activity:    Days per week: Not on file    Minutes per session: Not on file  . Stress: Not on file  Relationships  . Social connections:    Talks on phone: Not on file    Gets together: Not  on file    Attends religious service: Not on file    Active member of club or organization: Not on file    Attends meetings of clubs or organizations: Not on file    Relationship status: Not on file  . Intimate partner violence:    Fear of current or ex partner: Not on file    Emotionally abused: Not on file    Physically abused: Not on file    Forced sexual activity: Not on file  Other Topics Concern  . Not on file  Social History Narrative  . Not on file   Current Outpatient Medications on File Prior to Visit  Medication Sig Dispense Refill  . cholecalciferol (VITAMIN D) 1000 units tablet Take 1,000 Units by mouth daily.    . fluticasone (FLONASE) 50 MCG/ACT nasal spray Place 1 spray into both  nostrils daily as needed for allergies or rhinitis.    Marland Kitchen levothyroxine (SYNTHROID, LEVOTHROID) 150 MCG tablet TAKE ONE TABLET BY MOUTH ONCE DAILY BEFORE BREAKFAST (Patient taking differently: Take 150 mcg by mouth daily before breakfast. ) 30 tablet 0  . lisinopril-hydrochlorothiazide (PRINZIDE,ZESTORETIC) 10-12.5 MG per tablet Take 1 tablet by mouth daily. 30 tablet 2  . omeprazole (PRILOSEC) 20 MG capsule Take 1 capsule (20 mg total) by mouth daily. 30 capsule 6   No current facility-administered medications on file prior to visit.    Allergies  Allergen Reactions  . Aspirin Swelling   Family History  Problem Relation Age of Onset  . Asthma Mother   . Diabetes Father   . Breast cancer Paternal Aunt     PE: BP 128/90   Pulse 82   Ht 5' 4.17" (1.63 m)   Wt 202 lb (91.6 kg)   SpO2 97%   BMI 34.49 kg/m  Wt Readings from Last 3 Encounters:  03/10/18 202 lb (91.6 kg)  01/01/18 201 lb (91.2 kg)  09/30/17 213 lb (96.6 kg)   Constitutional: overweight, in NAD Eyes: PERRLA, EOMI, no exophthalmos ENT: moist mucous membranes, no thyromegaly, + left-sided large thyroid nodule clearly palpated.  This is moving freely with deglutition.  No cervical lymphadenopathy Cardiovascular: RRR, No MRG Respiratory: CTA B Gastrointestinal: abdomen soft, NT, ND, BS+ Musculoskeletal: no deformities, strength intact in all 4;  Skin: moist, warm, no rashes Neurological: no tremor with outstretched hands, DTR normal in all 4  ASSESSMENT: 1. Thyroid nodule  2.  Hypothyroidism  PLAN: 1. Thyroid nodule - I reviewed the images of her thyroid ultrasound along with the patient. I pointed out that the dominant nodule is large, however, it appears to be almost entirely filled with colloid, which is a benign feature.  Otherwise, the nodule is - without microcalcifications - without internal blood flow - more wide than tall - well delimited from surrounding tissue Pt does not have a thyroid cancer  family history or a personal history of RxTx to head/neck. All these would favor benignity. - We also reviewed together her previous thyroid biopsy results that were inconclusive.  However, I believe this may have been due to the mostly cystic nature of the nodule. - The patient does appear to be bothered by the nodular.  She feels choked occasionally especially if she lies on one side.  Also, the nodule appeared to have enlarged since the previous ultrasound.  She remembers that after her previous biopsies the nodule reformed fairly soon.  In this case, I suggested to have drainage of the nodule + ethanol instillation.  I explained  that alcohol instillation is a means to prevent re-formation of the cyst.  I would also recommend a biopsy of the material aspirated +/- Afirma testing, in light of the previous biopsy results. - I explained that if the biopsy is benign, we can continue to follow her on a yearly basis, and check another ultrasound in another year or 2. - If she develops neck compression symptoms again in the nodule enlarges again after the aspiration, we might need to do lobectomy in the future - I'll see her back in a year, assuming her FNA is normal. If FNA abnormal, we will meet sooner.  - I advised pt to join my chart and I will send her the results through there   2. Hypothyroidism - latest thyroid labs reviewed with pt >> TSH suppressed - she continues on LT4 150 mcg daily (dose decreased after the TSH was found to be too low) - we discussed about taking the thyroid hormone every day, with water, >30 minutes before breakfast, separated by >4 hours from acid reflux medications, calcium, iron, multivitamins. Pt. is taking it correctly. - will check thyroid tests today: TSH and fT4 - If labs are abnormal, she will need to return for repeat TFTs in 1.5 months  Component     Latest Ref Rng & Units 03/10/2018  TSH     0.35 - 4.50 uIU/mL <0.01 Repeated and verified X2. (L)   T4,Free(Direct)     0.60 - 1.60 ng/dL 1.58   TSH is still suppressed.  We will decrease her levothyroxine dose to 125 mcg daily and have her back in 1.5 months to recheck her TFTs.  Philemon Kingdom, MD PhD [facility] Endocrinology

## 2018-03-12 ENCOUNTER — Telehealth: Payer: Self-pay

## 2018-03-12 NOTE — Telephone Encounter (Signed)
I have discussed with Dr. Kathlene Cote about this procedure before and he performed the EtOH instillation on 1 of my patients.  We can definitely wait until Dr. Kathlene Cote returns from vacation.  It is no rush.

## 2018-03-12 NOTE — Telephone Encounter (Signed)
Please advise 

## 2018-03-12 NOTE — Telephone Encounter (Signed)
Great. Thank you.

## 2018-03-12 NOTE — Telephone Encounter (Signed)
Keila from GI called and stated the order placed for this patient is not something they routinely do have and she wanted to know if this was previously decided between Dr. Cruzita Lederer and Dr. Kathlene Cote. GI just wants to know if this is something that Dr. Cruzita Lederer is requesting or if both MD's were requesting this because Dr. Kathlene Cote is on vacation  and is not available to discuss the order please advise - good call back number is [phone]

## 2018-03-12 NOTE — Telephone Encounter (Signed)
Spoke with Noel Journey at GI and she will wait for Dr. Kathlene Cote to return from vacation so that this can be discussed before patient is scheduled as the procedure is done at the hospital and not in the office

## 2018-03-12 NOTE — Telephone Encounter (Signed)
LM for Noel Journey to return our call.

## 2018-03-31 ENCOUNTER — Other Ambulatory Visit: Payer: Self-pay | Admitting: Internal Medicine

## 2018-03-31 ENCOUNTER — Other Ambulatory Visit: Payer: Self-pay | Admitting: Student

## 2018-03-31 ENCOUNTER — Ambulatory Visit (HOSPITAL_COMMUNITY)
Admission: RE | Admit: 2018-03-31 | Discharge: 2018-03-31 | Disposition: A | Discharge status: Home / Self Care | Payer: Medicaid Other | Source: Ambulatory Visit | Attending: Internal Medicine | Admitting: Internal Medicine

## 2018-03-31 DIAGNOSIS — E041 Nontoxic single thyroid nodule: Secondary | ICD-10-CM

## 2018-03-31 MED ORDER — ALCOHOL 98 % IJ SOLN
5.0000 mL | Freq: Once | INTRAMUSCULAR | Status: DC
  Filled 2018-03-31: qty 5

## 2018-03-31 MED ORDER — LIDOCAINE HCL 1 % IJ SOLN
INTRAMUSCULAR | Status: AC
  Filled 2018-03-31: qty 20

## 2018-03-31 MED ORDER — ALCOHOL 98 % IJ SOLN: 5.0000 mL | Freq: Once | INTRAMUSCULAR | Status: AC

## 2018-04-28 ENCOUNTER — Other Ambulatory Visit (INDEPENDENT_AMBULATORY_CARE_PROVIDER_SITE_OTHER): Payer: Medicaid Other

## 2018-04-28 DIAGNOSIS — E890 Postprocedural hypothyroidism: Secondary | ICD-10-CM | POA: Diagnosis not present

## 2018-04-28 DIAGNOSIS — E032 Hypothyroidism due to medicaments and other exogenous substances: Secondary | ICD-10-CM

## 2018-04-28 LAB — T4, FREE: Free T4: 1.21 ng/dL (ref 0.60–1.60)

## 2018-04-28 LAB — TSH: TSH: 0.13 u[IU]/mL — ABNORMAL LOW (ref 0.35–4.50)

## 2018-04-29 ENCOUNTER — Other Ambulatory Visit: Payer: Medicaid Other

## 2018-04-30 ENCOUNTER — Other Ambulatory Visit: Payer: Self-pay

## 2018-04-30 MED ORDER — LEVOTHYROXINE SODIUM 112 MCG PO TABS: ORAL_TABLET | ORAL | 5 refills | Status: DC

## 2018-05-06 ENCOUNTER — Telehealth: Payer: Self-pay

## 2018-05-06 ENCOUNTER — Encounter: Payer: Self-pay | Admitting: Gastroenterology

## 2018-05-06 DIAGNOSIS — E890 Postprocedural hypothyroidism: Secondary | ICD-10-CM

## 2018-05-06 NOTE — Telephone Encounter (Signed)
New RX has been sent, labs ordered.  Left message for patient to return our call at [phone].

## 2018-05-06 NOTE — Telephone Encounter (Signed)
-----   Message from Philemon Kingdom, MD sent at 04/29/2018 11:42 AM EST ----- [name], can you please call pt: Her TSH is much better, but still too low.  Let's decrease her levothyroxine to 112 mcg daily (can you please send this and d/c the 125 mcg from her med list?) and have her back for thyroid labs in 1.5 months: can you please order a TSH and fT4?

## 2018-06-16 ENCOUNTER — Encounter: Payer: Medicaid Other | Admitting: Gastroenterology

## 2018-06-24 ENCOUNTER — Ambulatory Visit (AMBULATORY_SURGERY_CENTER): Payer: Self-pay | Admitting: *Deleted

## 2018-06-24 VITALS — Ht 64.0 in | Wt 209.0 lb

## 2018-06-24 DIAGNOSIS — Z1211 Encounter for screening for malignant neoplasm of colon: Secondary | ICD-10-CM

## 2018-06-24 MED ORDER — PEG-KCL-NACL-NASULF-NA ASC-C 140 G PO SOLR: 1.0000 | Freq: Once | ORAL | 0 refills | Status: AC

## 2018-06-24 NOTE — Progress Notes (Signed)
Patient denies any allergies to eggs or soy. Patient denies any problems with anesthesia/sedation. Patient denies any oxygen use at home. Patient denies taking any diet/weight loss medications or blood thinners. EMMI education offered, pt declined.  

## 2018-06-25 ENCOUNTER — Ambulatory Visit (HOSPITAL_COMMUNITY)
Admission: EM | Admit: 2018-06-25 | Discharge: 2018-06-25 | Disposition: A | Discharge status: Home / Self Care | Payer: Medicaid Other | Attending: Family Medicine | Admitting: Family Medicine

## 2018-06-25 ENCOUNTER — Encounter (HOSPITAL_COMMUNITY): Payer: Self-pay | Admitting: Emergency Medicine

## 2018-06-25 ENCOUNTER — Other Ambulatory Visit: Payer: Self-pay

## 2018-06-25 DIAGNOSIS — H66002 Acute suppurative otitis media without spontaneous rupture of ear drum, left ear: Secondary | ICD-10-CM | POA: Diagnosis not present

## 2018-06-25 DIAGNOSIS — R0789 Other chest pain: Secondary | ICD-10-CM

## 2018-06-25 MED ORDER — AMOXICILLIN 875 MG PO TABS: 875.0000 mg | ORAL_TABLET | Freq: Two times a day (BID) | ORAL | 0 refills | Status: AC

## 2018-06-25 MED ORDER — IBUPROFEN 600 MG PO TABS: 600.0000 mg | ORAL_TABLET | Freq: Four times a day (QID) | ORAL | 0 refills | Status: DC | PRN

## 2018-06-25 NOTE — ED Triage Notes (Signed)
Left ear pain that started yesterday Center epigastric pain, through to back also started yesterday.  This pain is worse with movement.

## 2018-07-01 ENCOUNTER — Other Ambulatory Visit: Payer: Self-pay | Admitting: Hematology & Oncology

## 2018-07-01 DIAGNOSIS — Z1231 Encounter for screening mammogram for malignant neoplasm of breast: Secondary | ICD-10-CM

## 2018-07-04 ENCOUNTER — Other Ambulatory Visit: Payer: Medicaid Other

## 2018-07-04 ENCOUNTER — Ambulatory Visit: Payer: Medicaid Other | Admitting: Family

## 2018-07-07 ENCOUNTER — Encounter: Payer: Self-pay | Admitting: Gastroenterology

## 2018-07-07 ENCOUNTER — Ambulatory Visit (AMBULATORY_SURGERY_CENTER): Payer: Medicaid Other | Admitting: Gastroenterology

## 2018-07-07 VITALS — BP 159/79 | HR 67 | Temp 98.6°F | Resp 25 | Ht 64.75 in | Wt 202.0 lb

## 2018-07-07 DIAGNOSIS — D128 Benign neoplasm of rectum: Secondary | ICD-10-CM | POA: Diagnosis not present

## 2018-07-07 DIAGNOSIS — D123 Benign neoplasm of transverse colon: Secondary | ICD-10-CM | POA: Diagnosis not present

## 2018-07-07 DIAGNOSIS — D129 Benign neoplasm of anus and anal canal: Secondary | ICD-10-CM

## 2018-07-07 DIAGNOSIS — Z1211 Encounter for screening for malignant neoplasm of colon: Secondary | ICD-10-CM

## 2018-07-07 DIAGNOSIS — K635 Polyp of colon: Secondary | ICD-10-CM

## 2018-07-07 MED ORDER — SODIUM CHLORIDE 0.9 % IV SOLN: 500.0000 mL | Freq: Once | INTRAVENOUS | Status: DC

## 2018-07-07 NOTE — Progress Notes (Signed)
Called to room to assist during endoscopic procedure.  Patient ID and intended procedure confirmed with present staff. Received instructions for my participation in the procedure from the performing physician.  

## 2018-07-07 NOTE — Patient Instructions (Signed)
Polyps handout given.  YOU HAD AN ENDOSCOPIC PROCEDURE TODAY AT [facility]:   Refer to the procedure report that was given to you for any specific questions about what was found during the examination.  If the procedure report does not answer your questions, please call your gastroenterologist to clarify.  If you requested that your care partner not be given the details of your procedure findings, then the procedure report has been included in a sealed envelope for you to review at your convenience later.  YOU SHOULD EXPECT: Some feelings of bloating in the abdomen. Passage of more gas than usual.  Walking can help get rid of the air that was put into your GI tract during the procedure and reduce the bloating. If you had a lower endoscopy (such as a colonoscopy or flexible sigmoidoscopy) you may notice spotting of blood in your stool or on the toilet paper. If you underwent a bowel prep for your procedure, you may not have a normal bowel movement for a few days.  Please Note:  You might notice some irritation and congestion in your nose or some drainage.  This is from the oxygen used during your procedure.  There is no need for concern and it should clear up in a day or so.  SYMPTOMS TO REPORT IMMEDIATELY:   Following lower endoscopy (colonoscopy or flexible sigmoidoscopy):  Excessive amounts of blood in the stool  Significant tenderness or worsening of abdominal pains  Swelling of the abdomen that is new, acute  Fever of 100F or higher  For urgent or emergent issues, a gastroenterologist can be reached at any hour by calling [phone].   DIET:  We do recommend a small meal at first, but then you may proceed to your regular diet.  Drink plenty of fluids but you should avoid alcoholic beverages for 24 hours.  ACTIVITY:  You should plan to take it easy for the rest of today and you should NOT DRIVE or use heavy machinery until tomorrow (because of the sedation medicines  used during the test).    FOLLOW UP: Our staff will call the number listed on your records the next business day following your procedure to check on you and address any questions or concerns that you may have regarding the information given to you following your procedure. If we do not reach you, we will leave a message.  However, if you are feeling well and you are not experiencing any problems, there is no need to return our call.  We will assume that you have returned to your regular daily activities without incident.  If any biopsies were taken you will be contacted by phone or by letter within the next 1-3 weeks.  Please call us at [phone] if you have not heard about the biopsies in 3 weeks.    SIGNATURES/CONFIDENTIALITY: You and/or your care partner have signed paperwork which will be entered into your electronic medical record.  These signatures attest to the fact that that the information above on your After Visit Summary has been reviewed and is understood.  Full responsibility of the confidentiality of this discharge information lies with you and/or your care-partner.

## 2018-07-07 NOTE — Progress Notes (Signed)
A and O x3. Report to RN. Tolerated MAC anesthesia well.

## 2018-07-07 NOTE — Op Note (Signed)
[place] Patient Name: [name] Procedure Date: 07/07/2018 9:27 AM MRN: [id number] Endoscopist: [place]. Loletha Carrow , MD Age: 58 Referring MD:  Date of Birth: Dec 17, 1960 Gender: Female Account #: [id number] Procedure:                Colonoscopy Indications:              Screening for colorectal malignant neoplasm, This                            is the patient's first colonoscopy Medicines:                Monitored Anesthesia Care Procedure:                Pre-Anesthesia Assessment:                           - Prior to the procedure, a History and Physical                            was performed, and patient medications and                            allergies were reviewed. The patient's tolerance of                            previous anesthesia was also reviewed. The risks                            and benefits of the procedure and the sedation                            options and risks were discussed with the patient.                            All questions were answered, and informed consent                            was obtained. Prior Anticoagulants: The patient has                            taken no previous anticoagulant or antiplatelet                            agents. ASA Grade Assessment: II - A patient with                            mild systemic disease. After reviewing the risks                            and benefits, the patient was deemed in                            satisfactory condition to undergo the procedure.  After obtaining informed consent, the colonoscope                            was passed under direct vision. Throughout the                            procedure, the patient's blood pressure, pulse, and                            oxygen saturations were monitored continuously. The                            Colonoscope was introduced through the anus and                            advanced to the the  cecum, identified by                            appendiceal orifice and ileocecal valve. The                            colonoscopy was performed without difficulty. The                            patient tolerated the procedure well. The quality                            of the bowel preparation was excellent. The                            ileocecal valve, appendiceal orifice, and rectum                            were photographed. Scope In: 9:30:28 AM Scope Out: 10:02:04 AM Scope Withdrawal Time: 0 hours 27 minutes 6 seconds  Total Procedure Duration: 0 hours 31 minutes 36 seconds  Findings:                 The perianal and digital rectal examinations were                            normal.                           A 12 mm polyp was found in the hepatic flexure. The                            polyp was sessile. The polyp was removed with a                            piecemeal technique using a hot snare. Resection                            and retrieval were complete.  A 4 mm polyp was found in the rectum. The polyp was                            sessile. The polyp was removed with a cold snare.                            Resection and retrieval were complete.                           The exam was otherwise without abnormality on                            direct and retroflexion views. Complications:            No immediate complications. Estimated Blood Loss:     Estimated blood loss was minimal. Impression:               - One 12 mm polyp at the hepatic flexure, removed                            piecemeal using a hot snare. Resected and retrieved.                           - One 4 mm polyp in the rectum, removed with a cold                            snare. Resected and retrieved.                           - The examination was otherwise normal on direct                            and retroflexion views. Recommendation:           - Patient has a  contact number available for                            emergencies. The signs and symptoms of potential                            delayed complications were discussed with the                            patient. Return to normal activities tomorrow.                            Written discharge instructions were provided to the                            patient.                           - Resume previous diet.                           -  Continue present medications.                           - Await pathology results.                           - Repeat colonoscopy is recommended for                            surveillance. The colonoscopy date will be                            determined after pathology results from today's                            exam become available for review. Rosellen Lichtenberger L. Loletha Carrow, MD 07/07/2018 10:22:03 AM This report has been signed electronically.

## 2018-07-08 ENCOUNTER — Telehealth: Payer: Self-pay | Admitting: *Deleted

## 2018-07-08 NOTE — ED Provider Notes (Signed)
[place]   [id number] 06/25/18 Arrival Time: 7793  ASSESSMENT & PLAN:  1. Non-recurrent acute suppurative otitis media of left ear without spontaneous rupture of tympanic membrane   2. Anterior chest wall pain     Meds ordered this encounter  Medications  . amoxicillin (AMOXIL) 875 MG tablet    Sig: Take 1 tablet (875 mg total) by mouth 2 (two) times daily for 10 days.    Dispense:  20 tablet    Refill:  0  . ibuprofen (ADVIL,MOTRIN) 600 MG tablet    Sig: Take 1 tablet (600 mg total) by mouth every 6 (six) hours as needed (chest wall pain).    Dispense:  21 tablet    Refill:  0   I do not suspect cardiac or respiratory etiology of her chest wall pain. Likely mild strain or costochondritis. Reassured. Did discuss CP return precautions. Discussed typical duration of symptoms. OTC symptom care as needed. Ensure adequate fluid intake and rest. May f/u with PCP or here as needed.  Reviewed expectations re: course of current medical issues. Questions answered. Outlined signs and symptoms indicating need for more acute intervention. Patient verbalized understanding. After Visit Summary given.   SUBJECTIVE: History from: patient.  [name] is a 58 y.o. female who presents with complaint of left otalgia; without drainage; without bleeding. Onset gradual, yesterday. Recent cold symptoms: mild. Fever: no. Overall normal PO intake without n/v. Sick contacts: no. OTC treatment: Tylenol without much relief.  Also reports anterior chest wall discomfort. Noticed over the past couple of days. Not present when still. Feels only with certain movements of torso. No n/v/SOB/diaphoresis/palpatations. No OTC tx. No injuries reported.  Social History   Tobacco Use  Smoking Status Never Smoker  Smokeless Tobacco Never Used  Tobacco Comment   never used tobacco   ROS: As per HPI. All other systems negative   OBJECTIVE:  Vitals taken by me. BP: 130/84  HR: 78  P  82  General appearance: alert; appears fatigued Ear: L TM with erythema and slight bulging; no perforation; EAC normal Neck: supple without LAD CV: RRR without murmer Lungs: unlabored respirations, symmetrical air entry; cough: absent; no respiratory distress Chest Wall: somewhat tender to palpation anteriorly similar to soreness she describes above Skin: warm and dry Psychological: alert and cooperative; normal mood and affect  Allergies  Allergen Reactions  . Aspirin Swelling  . Sulfur Nausea Only    Past Medical History:  Diagnosis Date  . Acid reflux   . Cancer [facility]) 1995   breast cancer  . Essential hypertension 02/16/2015  . Hypertension   . Obesity 02/16/2015  . Post-operative nausea and vomiting   . Thyroid disease   . Vertigo    Family History  Problem Relation Age of Onset  . Asthma Mother   . Diabetes Father   . Breast cancer Paternal Aunt   . Stomach cancer Paternal Aunt   . Colon cancer Neg Hx   . Colon polyps Neg Hx   . Esophageal cancer Neg Hx   . Rectal cancer Neg Hx    Social History   Socioeconomic History  . Marital status: Single    Spouse name: Not on file  . Number of children: Not on file  . Years of education: Not on file  . Highest education level: Not on file  Occupational History  . Not on file  Social Needs  . Financial resource strain: Not on file  . Food insecurity:  Worry: Not on file    Inability: Not on file  . Transportation needs:    Medical: Not on file    Non-medical: Not on file  Tobacco Use  . Smoking status: Never Smoker  . Smokeless tobacco: Never Used  . Tobacco comment: never used tobacco  Substance and Sexual Activity  . Alcohol use: No  . Drug use: No  . Sexual activity: Not on file  Lifestyle  . Physical activity:    Days per week: Not on file    Minutes per session: Not on file  . Stress: Not on file  Relationships  . Social connections:    Talks on phone: Not on file    Gets together: Not on file     Attends religious service: Not on file    Active member of club or organization: Not on file    Attends meetings of clubs or organizations: Not on file    Relationship status: Not on file  . Intimate partner violence:    Fear of current or ex partner: Not on file    Emotionally abused: Not on file    Physically abused: Not on file    Forced sexual activity: Not on file  Other Topics Concern  . Not on file  Social History Narrative  . Not on file            Vanessa Kick, MD 07/16/18 [phone]

## 2018-07-08 NOTE — Telephone Encounter (Signed)
  Follow up Call-  Call back number 07/07/2018  Post procedure Call Back phone  # [phone]  Permission to leave phone message Yes  Some recent data might be hidden     Patient questions:  Do you have a fever, pain , or abdominal swelling? No. Pain Score  0 *  Have you tolerated food without any problems? Yes.    Have you been able to return to your normal activities? Yes.    Do you have any questions about your discharge instructions: Diet   No. Medications  No. Follow up visit  No.  Do you have questions or concerns about your Care? No.  Actions: * If pain score is 4 or above: No action needed, pain <4.

## 2018-07-22 ENCOUNTER — Encounter: Payer: Self-pay | Admitting: Gastroenterology

## 2018-08-01 ENCOUNTER — Ambulatory Visit
Admission: RE | Admit: 2018-08-01 | Discharge: 2018-08-01 | Disposition: A | Discharge status: Home / Self Care | Payer: Medicaid Other | Source: Ambulatory Visit | Attending: Hematology & Oncology | Admitting: Hematology & Oncology

## 2018-08-01 DIAGNOSIS — Z1231 Encounter for screening mammogram for malignant neoplasm of breast: Secondary | ICD-10-CM

## 2018-09-06 ENCOUNTER — Emergency Department (HOSPITAL_COMMUNITY): Payer: Medicaid Other

## 2018-09-06 ENCOUNTER — Encounter (HOSPITAL_COMMUNITY): Payer: Self-pay | Admitting: *Deleted

## 2018-09-06 ENCOUNTER — Emergency Department (HOSPITAL_COMMUNITY)
Admission: EM | Admit: 2018-09-06 | Discharge: 2018-09-06 | Disposition: A | Discharge status: Home / Self Care | Payer: Medicaid Other | Attending: Emergency Medicine | Admitting: Emergency Medicine

## 2018-09-06 DIAGNOSIS — Z853 Personal history of malignant neoplasm of breast: Secondary | ICD-10-CM | POA: Diagnosis not present

## 2018-09-06 DIAGNOSIS — R202 Paresthesia of skin: Secondary | ICD-10-CM | POA: Diagnosis present

## 2018-09-06 DIAGNOSIS — Z79899 Other long term (current) drug therapy: Secondary | ICD-10-CM | POA: Diagnosis not present

## 2018-09-06 LAB — CBC WITH DIFFERENTIAL/PLATELET
Abs Immature Granulocytes: 0.01 10*3/uL (ref 0.00–0.07)
Basophils Absolute: 0 10*3/uL (ref 0.0–0.1)
Basophils Relative: 0 %
EOS PCT: 1 %
Eosinophils Absolute: 0.1 10*3/uL (ref 0.0–0.5)
HCT: 36.7 % (ref 36.0–46.0)
Hemoglobin: 11.7 g/dL — ABNORMAL LOW (ref 12.0–15.0)
Immature Granulocytes: 0 %
Lymphocytes Relative: 35 %
Lymphs Abs: 2 10*3/uL (ref 0.7–4.0)
MCH: 27.1 pg (ref 26.0–34.0)
MCHC: 31.9 g/dL (ref 30.0–36.0)
MCV: 85 fL (ref 80.0–100.0)
Monocytes Absolute: 0.3 10*3/uL (ref 0.1–1.0)
Monocytes Relative: 5 %
Neutro Abs: 3.3 10*3/uL (ref 1.7–7.7)
Neutrophils Relative %: 59 %
PLATELETS: 234 10*3/uL (ref 150–400)
RBC: 4.32 MIL/uL (ref 3.87–5.11)
RDW: 13.2 % (ref 11.5–15.5)
WBC: 5.7 10*3/uL (ref 4.0–10.5)
nRBC: 0 % (ref 0.0–0.2)

## 2018-09-06 LAB — COMPREHENSIVE METABOLIC PANEL
ALT: 17 U/L (ref 0–44)
AST: 18 U/L (ref 15–41)
Albumin: 4.5 g/dL (ref 3.5–5.0)
Alkaline Phosphatase: 73 U/L (ref 38–126)
Anion gap: 8 (ref 5–15)
BUN: 14 mg/dL (ref 6–20)
CALCIUM: 9.2 mg/dL (ref 8.9–10.3)
CO2: 24 mmol/L (ref 22–32)
Chloride: 106 mmol/L (ref 98–111)
Creatinine, Ser: 0.7 mg/dL (ref 0.44–1.00)
GFR calc Af Amer: >60 mL/min (ref >60)
GFR calc non Af Amer: >60 mL/min (ref >60)
Glucose, Bld: 102 mg/dL — ABNORMAL HIGH (ref 70–99)
Potassium: 3.5 mmol/L (ref 3.5–5.1)
Sodium: 138 mmol/L (ref 135–145)
Total Bilirubin: 0.6 mg/dL (ref 0.3–1.2)
Total Protein: 8 g/dL (ref 6.5–8.1)

## 2018-09-06 LAB — TROPONIN I: Troponin I: <0.03 ng/mL (ref <0.03)

## 2018-09-06 LAB — URINALYSIS, ROUTINE W REFLEX MICROSCOPIC
Bacteria, UA: NONE SEEN
Bilirubin Urine: NEGATIVE
Glucose, UA: NEGATIVE mg/dL
Ketones, ur: NEGATIVE mg/dL
Leukocytes,Ua: NEGATIVE
Nitrite: NEGATIVE
PROTEIN: NEGATIVE mg/dL
Specific Gravity, Urine: 1.006 (ref 1.005–1.030)
pH: 5 (ref 5.0–8.0)

## 2018-09-06 LAB — SEDIMENTATION RATE: Sed Rate: 18 mm/hr (ref 0–22)

## 2018-09-06 LAB — PHOSPHORUS: Phosphorus: 3.1 mg/dL (ref 2.5–4.6)

## 2018-09-06 LAB — PROTIME-INR
INR: 0.9 (ref 0.8–1.2)
Prothrombin Time: 12.1 seconds (ref 11.4–15.2)

## 2018-09-06 LAB — MAGNESIUM: Magnesium: 2.3 mg/dL (ref 1.7–2.4)

## 2018-09-06 LAB — TSH: TSH: 0.112 u[IU]/mL — ABNORMAL LOW (ref 0.350–4.500)

## 2018-09-06 NOTE — ED Provider Notes (Signed)
Patient signed to me by Dr. Vallery Ridge.  Patient's MRI result reviewed and without acute findings.  Patient has no neurological symptoms at time of discharge.  Instructed to follow-up with her doctor   Lacretia Leigh, MD 09/06/18 1900

## 2018-09-06 NOTE — ED Provider Notes (Signed)
[place] DEPT Provider Note   CSN: [id number] Arrival date & time: 09/06/18  1309    History   Chief Complaint Chief Complaint  Patient presents with  . Numbness  . Tingling    HPI [name] is a 58 y.o. female.     HPI Patient had an episode of her left leg going numb several days ago.  She reports she was watching television and in a seated position and then her leg felt kind of numb and heavy on the left.  She thought maybe it was because she had been seated for a while and her leg had "gone to sleep".  She denies any associated back pain.  She reports that today upon awakening she felt that the left leg was numb and unusual feeling.  She denies weakness.  It felt like there was decreased sensation on the foot, lower leg and thigh.  Then, no associated back pain.  Reports she also had a sensation of pain in the side of her upper left arm.  She does not specifically qualify this is being weak or numb.  She reports her arm did feel kind of painful and unusual however.  She is well this morning noted that her right face had a tingling sensation to the lower part.  No changes of vision.  No double vision no loss of vision.  No difficulty with speech.  No headache.  No gait incoordination or inability to walk.  Patient reports she went to urgent care because she was concerned about possibly having a stroke.  He was referred to come to the emergency department. Past Medical History:  Diagnosis Date  . Acid reflux   . Cancer [facility]) 1995   breast cancer  . Essential hypertension 02/16/2015  . Hypertension   . Obesity 02/16/2015  . Post-operative nausea and vomiting   . Thyroid disease   . Vertigo     Patient Active Problem List   Diagnosis Date Noted  . Thyroid nodule 03/10/2018  . 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 Essential hypertension 02/16/2015  . Obesity 02/16/2015  . Acute sinus infection 06/09/2014  . Adenocarcinoma of left  breast ([place]) 10/27/2013    Past Surgical History:  Procedure Laterality Date  . ABDOMINAL HYSTERECTOMY    . ESOPHAGEAL DILATION  2006?   Dr.Hayes  . 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 MASTECTOMY Left 2007  . MASTECTOMY       OB History    Gravida  2   Para  2   Term  2   Preterm      AB      Living  2     SAB      TAB      Ectopic      Multiple      Live Births               Home Medications    Prior to Admission medications   Medication Sig Start Date End Date Taking? Authorizing Provider  cholecalciferol (VITAMIN D) 1000 units tablet Take 1,000 Units by mouth daily.   Yes [provider]  levothyroxine (SYNTHROID, LEVOTHROID) 112 MCG tablet Take 1 tablet daily before breakfast Patient taking differently: Take 125 mcg by mouth daily before breakfast.  04/30/18  Yes Philemon Kingdom, MD  lisinopril-hydrochlorothiazide (PRINZIDE,ZESTORETIC) 10-12.5 MG per tablet Take 1 tablet by mouth daily. 03/01/15  Yes Volanda Napoleon, MD  omeprazole (  PRILOSEC) 20 MG capsule Take 1 capsule (20 mg total) by mouth daily. 09/09/15  Yes Volanda Napoleon, MD  PROVENTIL HFA 108 [phone] Base) MCG/ACT inhaler Inhale 2 puffs into the lungs every 4 (four) hours as needed for wheezing or shortness of breath.  04/06/18  Yes [provider]  fluticasone (FLONASE) 50 MCG/ACT nasal spray Place 1 spray into both nostrils daily as needed for allergies or rhinitis.    [provider]  ibuprofen (ADVIL,MOTRIN) 600 MG tablet Take 1 tablet (600 mg total) by mouth every 6 (six) hours as needed (chest wall pain). Patient not taking: Reported on 09/06/2018 06/25/18   Vanessa Kick, MD    Family History Family History  Problem Relation Age of Onset  . Asthma Mother   . Diabetes Father   . Breast cancer Paternal Aunt   . Stomach cancer Paternal Aunt   . Colon cancer Neg Hx   . Colon polyps Neg Hx   . Esophageal cancer Neg Hx   . Rectal cancer Neg  Hx     Social History Social History   Tobacco Use  . Smoking status: Never Smoker  . Smokeless tobacco: Never Used  . Tobacco comment: never used tobacco  Substance Use Topics  . Alcohol use: No  . Drug use: No     Allergies   Aspirin and Sulfur   Review of Systems Review of Systems 10 Systems reviewed and are negative for acute change except as noted in the HPI.   Physical Exam Updated Vital Signs BP 119/83   Pulse 90   Temp 98.2 F (36.8 C) (Oral)   Resp 16   SpO2 100%   Physical Exam Constitutional:      Appearance: Normal appearance.  HENT:     Head: Normocephalic and atraumatic.     Nose: Nose normal.     Mouth/Throat:     Mouth: Mucous membranes are moist.     Pharynx: Oropharynx is clear.  Eyes:     Extraocular Movements: Extraocular movements intact.     Conjunctiva/sclera: Conjunctivae normal.     Pupils: Pupils are equal, round, and reactive to light.  Neck:     Musculoskeletal: Normal range of motion and neck supple.  Cardiovascular:     Rate and Rhythm: Normal rate and regular rhythm.     Pulses: Normal pulses.     Heart sounds: Normal heart sounds.  Pulmonary:     Effort: Pulmonary effort is normal.     Breath sounds: Normal breath sounds.  Abdominal:     General: There is no distension.     Palpations: Abdomen is soft.     Tenderness: There is no abdominal tenderness.  Musculoskeletal: Normal range of motion.        General: Tenderness present. No swelling.     Right lower leg: No edema.     Left lower leg: No edema.     Comments: Left upper extremity is grossly normal however patient does have some mild swelling of the forearm and hand.  There is no erythema or warmth to this.  Patient did identifies this as her chronic lymphedema from prior history of breast cancer.  Slight tenderness to the lateral outer upper arm.  No palpable abnormality.  Radial pulse 2+.  Lower extremities no peripheral edema.  Calves are nontender.  Feet are warm  and dry.  Dorsalis pedis pulses are 2+ and symmetric.  Skin:    General: Skin is warm and dry.  Neurological:  Mental Status: She is alert.     Comments: Cranial nerves II through XII intact.  No visual deficits.  Upper extremity strength 5\5 for grip strength no pronator drift.  Patient can elevate each lower extremity off of the bed hold against resistance.  Normal plantarflexion and dorsiflexion.  Patient endorses slight differential to light touch on the left lower leg versus the right lower leg.  Psychiatric:        Mood and Affect: Mood normal.      ED Treatments / Results  Labs (all labs ordered are listed, but only abnormal results are displayed) Labs Reviewed  COMPREHENSIVE METABOLIC PANEL - Abnormal; Notable for the following components:      Result Value   Glucose, Bld 102 (*)    All other components within normal limits  CBC WITH DIFFERENTIAL/PLATELET - Abnormal; Notable for the following components:   Hemoglobin 11.7 (*)    All other components within normal limits  URINALYSIS, ROUTINE W REFLEX MICROSCOPIC - Abnormal; Notable for the following components:   Color, Urine STRAW (*)    Hgb urine dipstick SMALL (*)    All other components within normal limits  TSH - Abnormal; Notable for the following components:   TSH 0.112 (*)    All other components within normal limits  TROPONIN I  PROTIME-INR  SEDIMENTATION RATE  MAGNESIUM  PHOSPHORUS    EKG EKG Interpretation  Date/Time:  Saturday September 06 2018 14:48:31 EDT Ventricular Rate:  87 PR Interval:    QRS Duration: 102 QT Interval:  351 QTC Calculation: 423 R Axis:   26 Text Interpretation:  Sinus rhythm Borderline repolarization abnormality Baseline wander in lead(s) V4 no sig change from previous Confirmed by Charlesetta Shanks [id number]) on 09/06/2018 3:16:06 PM   Radiology Mr Brain Wo Contrast  Result Date: 09/06/2018 CLINICAL DATA:  Numbness and tingling in the left leg and left hand. Intermittent right  facial tingling. EXAM: MRI HEAD WITHOUT CONTRAST TECHNIQUE: Multiplanar, multiecho pulse sequences of the brain and surrounding structures were obtained without intravenous contrast. COMPARISON:  Head CT 07/06/2017 FINDINGS: Brain: There is no evidence of acute infarct, intracranial hemorrhage, mass, midline shift, or extra-axial fluid collection. The ventricles and sulci are normal. The brain is normal in signal. The cerebellar tonsils extend minimally below the foramen magnum, considered to be within normal limits. Vascular: Major intracranial vascular flow voids are preserved. Skull and upper cervical spine: Unremarkable bone marrow signal. Sinuses/Orbits: Unremarkable orbits. Paranasal sinuses and mastoid air cells are clear. Other: None. IMPRESSION: Negative brain MRI. Electronically Signed   By: Logan Bores M.D.   On: 09/06/2018 18:38    Procedures Procedures (including critical care time)  Medications Ordered in ED Medications - No data to display   Initial Impression / Assessment and Plan / ED Course  I have reviewed the triage vital signs and the nursing notes.  Pertinent labs & imaging results that were available during my care of the patient were reviewed by me and considered in my medical decision making (see chart for details).        Patient has had a pattern of paresthesia that was somewhat concentrated to the outer lateral surface of the left lower extremity.  No back pain and no weakness.  Her gait has not been impaired.  Patient also advised of the pain in the back of the left arm.  Pattern is somewhat atypical but with history of breast cancer we will proceed with MRI to rule out metastatic disease, stroke  or other structural etiologies.  At this time, do not suspect any spinal cord compression.  Patient does have a slightly radicular pattern to her symptoms however no associated pain and no associated weakness.  Patient would be stable for further outpatient work-up of  radicular paresthesias.  Dr. Zenia Resides to follow-up on results of MRI for final disposition.  Final Clinical Impressions(s) / ED Diagnoses   Final diagnoses:  Paresthesia    ED Discharge Orders    None       Charlesetta Shanks, MD 09/07/18 [phone]

## 2018-09-06 NOTE — ED Notes (Signed)
Nurse Secretary has been made aware of MRI.

## 2018-09-06 NOTE — ED Notes (Signed)
Patient is becoming upset and disappointed in the time it is taking for patient to go to MRI. RN has informed patient that if patient does not want to stay that patient can leave. Patient continued to make remarks about going home and getting something to eat and not having had anything to eat since lunch today.

## 2018-09-06 NOTE — ED Triage Notes (Signed)
Pt complains of numbness and tingling in her left leg and left hand. Pt felt intermittent tingling in the right side of her face since waking up. Pt states her leg felt numb one week ago. Pt is concerned she is having stroke. No facial droop, arm drift, or slurred speech in triage.

## 2018-11-06 ENCOUNTER — Other Ambulatory Visit: Payer: Self-pay | Admitting: Family Medicine

## 2018-11-06 DIAGNOSIS — R102 Pelvic and perineal pain: Secondary | ICD-10-CM

## 2018-11-13 ENCOUNTER — Other Ambulatory Visit: Payer: Self-pay | Admitting: Family Medicine

## 2018-11-14 ENCOUNTER — Other Ambulatory Visit: Payer: Medicaid Other

## 2018-11-24 ENCOUNTER — Other Ambulatory Visit: Payer: Medicaid Other

## 2018-12-05 ENCOUNTER — Ambulatory Visit
Admission: RE | Admit: 2018-12-05 | Discharge: 2018-12-05 | Disposition: A | Discharge status: Home / Self Care | Payer: Medicaid Other | Source: Ambulatory Visit | Attending: Family Medicine | Admitting: Family Medicine

## 2018-12-05 DIAGNOSIS — R102 Pelvic and perineal pain: Secondary | ICD-10-CM

## 2018-12-19 ENCOUNTER — Other Ambulatory Visit: Payer: Self-pay | Admitting: Hematology & Oncology

## 2019-01-27 ENCOUNTER — Ambulatory Visit: Payer: Medicaid Other | Admitting: Physical Therapy

## 2019-02-06 ENCOUNTER — Ambulatory Visit: Payer: Medicaid Other | Admitting: Physical Therapy

## 2019-02-06 ENCOUNTER — Ambulatory Visit: Payer: Medicaid Other | Attending: Family Medicine | Admitting: Physical Therapy

## 2019-02-06 ENCOUNTER — Encounter: Payer: Self-pay | Admitting: Physical Therapy

## 2019-02-06 ENCOUNTER — Other Ambulatory Visit: Payer: Self-pay

## 2019-02-06 DIAGNOSIS — R293 Abnormal posture: Secondary | ICD-10-CM | POA: Diagnosis present

## 2019-02-06 DIAGNOSIS — I972 Postmastectomy lymphedema syndrome: Secondary | ICD-10-CM | POA: Insufficient documentation

## 2019-02-06 NOTE — Therapy (Signed)
[place], [place], [address] Phone: [phone]   Fax:  [phone]  Physical Therapy Evaluation  Patient Details  Name: [name] MRN: [id number] Date of Birth: 1960-07-29 Referring Provider (PT): Consuello Masse   Encounter Date: 02/06/2019  PT End of Session - 02/06/19 0948    Visit Number  1    Number of Visits  4    Date for PT Re-Evaluation  03/06/19    PT Start Time  0930    PT Stop Time  0945    PT Time Calculation (min)  15 min    Activity Tolerance  Patient tolerated treatment well    Behavior During Therapy  [facility] for tasks assessed/performed       Past Medical History:  Diagnosis Date  . Acid reflux   . Cancer [facility]) 1995   breast cancer  . Essential hypertension 02/16/2015  . Hypertension   . Obesity 02/16/2015  . Post-operative nausea and vomiting   . Thyroid disease   . Vertigo     Past Surgical History:  Procedure Laterality Date  . ABDOMINAL HYSTERECTOMY    . ESOPHAGEAL DILATION  2006?   Dr.Hayes  . 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 MASTECTOMY Left 2007  . MASTECTOMY      There were no vitals filed for this visit.   Subjective Assessment - 02/06/19 0931    Subjective  I have an appointment in [place] at 10:15. The compression garments work. I can not leave them on long. My arm and hand swell and goes down. It does not stay the same all the time.    Pertinent History  left breast cancer in 1995 with mastectomy with lymph nodes removed ( not sure how many) with chemo.  left arm and back started swelling about 2 years ago, she got a sleeve and glove in 2019.    Patient Stated Goals  to get the swelling gone     Currently in Pain?  No/denies         [facility] PT Assessment - 02/06/19 0001      Assessment   Medical Diagnosis  left breast mastectomy with lymphedema     Referring Provider (PT)  Consuello Masse    Onset Date/Surgical Date  06/11/93    Hand Dominance  Right      Precautions   Precautions  None;Other (comment)    Precaution Comments  lymphedema      Restrictions   Weight Bearing Restrictions  No      Balance Screen   Has the patient fallen in the past 6 months  No    Has the patient had a decrease in activity level because of a fear of falling?   No    Is the patient reluctant to leave their home because of a fear of falling?   No      Home Film/video editor residence    Living Arrangements  Children   pt lives with son   Available Help at Discharge  Family    Type of Home  Apartment      Prior Function   Level of [place]  On disability    Leisure  walks every other day for about 3 miles      Cognition   Overall Cognitive Status  Within Functional Limits for tasks assessed  Observation/Other Assessments   Observations  pt has visible fullness of left arm, fullness in left lateral trunk    Skin Integrity  --    Quick DASH   --      Observation/Other Assessments-Edema    Edema  --      Sensation   Light Touch  --      Coordination   Gross Motor Movements are Fluid and Coordinated  --      Posture/Postural Control   Posture/Postural Control  Postural limitations    Postural Limitations  Rounded Shoulders;Forward head    Posture Comments  --      AROM   Overall AROM   Within functional limits for tasks performed    Right Shoulder Flexion  --    Right Shoulder ABduction  --    Left Shoulder Flexion  --    Left Shoulder ABduction  --      Strength   Overall Strength Comments  --      Palpation   Palpation comment  --        LYMPHEDEMA/ONCOLOGY QUESTIONNAIRE - 02/06/19 0939      Type   Cancer Type  left breast       Surgeries   Mastectomy Date  06/11/93   approximate date    Number Lymph Nodes Removed  --   pt unable to remember     Treatment   Active Chemotherapy Treatment  No    Past Chemotherapy Treatment  Yes    Date   11/09/93    Active Radiation Treatment  No    Past Radiation Treatment  No    Current Hormone Treatment  No    Past Hormone Therapy  Yes      What other symptoms do you have   Are you Having Heaviness or Tightness  No    Are you having Pain  Yes    Are you having pitting edema  No    Body Site  --    Is it Hard or Difficult finding clothes that fit  No    Do you have infections  No    Is there Decreased scar mobility  No    Stemmer Sign  --      Lymphedema Stage   Stage  --   gets a little better in the morning      Right Upper Extremity Lymphedema   15 cm Proximal to Olecranon Process  34.5 cm    10 cm Proximal to Olecranon Process  --    Olecranon Process  28 cm    15 cm Proximal to Ulnar Styloid Process  28 cm    10 cm Proximal to Ulnar Styloid Process  --    Just Proximal to Ulnar Styloid Process  17 cm    Across Hand at [employer]  20 cm    At [place] of 2nd Digit  6.4 cm      Left Upper Extremity Lymphedema   15 cm Proximal to Olecranon Process  36 cm    Olecranon Process  29 cm    15 cm Proximal to Ulnar Styloid Process  29.6 cm    10 cm Proximal to Ulnar Styloid Process  --    Just Proximal to Ulnar Styloid Process  20.2 cm    Across Hand at [employer]  22 cm    At [place] of 2nd Digit  7 cm  Objective measurements completed on examination: See above findings.              PT Education - 02/06/19 0959    Education provided  Yes    Education Details  sleep in night time garments and don day time sleeve and glove as soon as she wakes up    Person(s) Educated  Patient    Methods  Explanation    Comprehension  Verbalized understanding          PT Long Term Goals - 02/06/19 0957      PT LONG TERM GOAL #1   Title  Pt will be able to independently manage her lymphedema through self MLD and day and night time garments.    Time  4    Period  Weeks    Status  New    Target Date  03/06/19      PT LONG TERM GOAL #2   Title   Pt will be independent in self MLD for long term management of lymphedema    Time  4    Period  Weeks    Status  New    Target Date  03/06/19      PT LONG TERM GOAL #3   Title  Pt will reduce circumference of area just proximal to ulnar styloid process by 1 cm to allow improved comfort when wearing compression.    Baseline  20.2    Time  4    Period  Weeks    Status  New    Target Date  03/06/19             Plan - 02/06/19 [id number]    Clinical Impression Statement  Pt presents to PT today with LUE lymphedema. She was previously seen in this clinic in 2019 and received compression garments both day and night time. She reports she has not been sleeping in the night time garment because she thought she was not supposed to and is only able to wear day time garments for short periods of time. Pt seen very briefly today because she had another appointment in [place] at 10:15. Pt's largest circumferential difference between L and R UEs is at her wrist which is about a 3 cm difference. Educated pt to bring garments to next session to assess fit. Also instructed pt to wear night time garment and when she wakes up to don day time garment. Pt would benefit from skilled PT services to decrease LUE lymphedema and ensure that pt is able to use compression garments to independently manage lymphedema.    Personal Factors and Comorbidities  Transportation;Time since onset of injury/illness/exacerbation    Stability/Clinical Decision Making  Stable/Uncomplicated    Clinical Decision Making  Low    Rehab Potential  Good    Clinical Impairments Affecting Rehab Potential  previous mastectomy and chemotherapy     PT Frequency  --   3 visits per auth   PT Duration  4 weeks    PT Treatment/Interventions  Manual lymph drainage;Compression bandaging;Taping;Vasopneumatic Device;Scar mobilization;Patient/family education;Orthotic Fit/Training;Manual techniques;Therapeutic exercise;DME Instruction;ADLs/Self Care Home  Management;Therapeutic activities    PT Next Visit Plan  assess garments for fit, instruct in self MLD, begin MLD to LUE    PT Home Exercise Plan  sleep in night time garment, don day time garment as soon as she wakes up    Consulted and Agree with Plan of Care  Patient       Patient will benefit from skilled therapeutic  intervention in order to improve the following deficits and impairments:  Decreased knowledge of precautions, Increased edema, Pain, Decreased knowledge of use of DME, Postural dysfunction  Visit Diagnosis: Postmastectomy lymphedema  Abnormal posture     Problem List Patient Active Problem List   Diagnosis Date Noted  . Thyroid nodule 03/10/2018  . 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 Essential hypertension 02/16/2015  . Obesity 02/16/2015  . Acute sinus infection 06/09/2014  . Adenocarcinoma of left breast [facility]) 10/27/2013    Allyson Sabal [facility] 02/06/2019, 10:00 AM  [place], [place], [address] Phone: [phone]   Fax:  [phone]  Name: [name] MRN: [id number] Date of Birth: 19-Oct-1960  Manus Gunning, PT 02/06/19 10:00 AM

## 2019-02-26 ENCOUNTER — Ambulatory Visit: Payer: Medicaid Other | Attending: Family Medicine | Admitting: Physical Therapy

## 2019-03-05 ENCOUNTER — Encounter: Payer: Medicaid Other | Admitting: Physical Therapy

## 2019-03-09 ENCOUNTER — Other Ambulatory Visit: Payer: Self-pay

## 2019-03-10 DIAGNOSIS — R09A2 Foreign body sensation, throat: Secondary | ICD-10-CM | POA: Insufficient documentation

## 2019-03-10 DIAGNOSIS — R0989 Other specified symptoms and signs involving the circulatory and respiratory systems: Secondary | ICD-10-CM | POA: Insufficient documentation

## 2019-03-10 DIAGNOSIS — R490 Dysphonia: Secondary | ICD-10-CM | POA: Insufficient documentation

## 2019-03-10 DIAGNOSIS — J3801 Paralysis of vocal cords and larynx, unilateral: Secondary | ICD-10-CM | POA: Insufficient documentation

## 2019-03-11 ENCOUNTER — Ambulatory Visit (INDEPENDENT_AMBULATORY_CARE_PROVIDER_SITE_OTHER): Payer: Medicaid Other | Admitting: Internal Medicine

## 2019-03-11 DIAGNOSIS — E890 Postprocedural hypothyroidism: Secondary | ICD-10-CM

## 2019-03-11 DIAGNOSIS — E041 Nontoxic single thyroid nodule: Secondary | ICD-10-CM

## 2019-03-11 NOTE — Progress Notes (Signed)
NO SHOW  Patient ID: [name], female   DOB: 07-Apr-1961, 58 y.o.   MRN: [id number]    HPI  [name] is a 58 y.o.-year-old female, initially referred by her PCP, Dr. 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  Left thyroid nodule  -diagnosed in 2015.  Reviewed and addended previous investigation: FNA (09/15/2013): AUS FNA (01/14/2014): FLUS Thyroid U/S (10/04/2017): Complex left lobe nodule 1 measures 4.9 x 3.9 x 3.9 cm and previously measured 3.6 x 2.9 x 3.0 cm. Biopsy has been performed previously.  At last visit, I referred her for alcohol instillation of the cyst (Dr. 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  Hypothyroidism: - postablation -Not well controlled.  In the last 1.5 years, we have been decreasing the dose of her levothyroxine from 175 to 112 mcg daily.  She is currently on levothyroxine 112 mcg (dose decreased 08/2018) - in am - fasting - at least 30 min from b'fast - no Ca, Fe, MVI, PPIs - not on Biotin  Reviewed patient's TFTs: Lab Results  Component Value Date   TSH 0.112 (L) 09/06/2018   TSH 0.13 (L) 04/28/2018   TSH <0.01 Repeated and verified X2. (L) 03/10/2018   TSH 0.088 (L) 01/01/2018   TSH 42.838 (H) 07/03/2017   FREET4 1.21 04/28/2018   FREET4 1.58 03/10/2018    She continues to experience hot flashes and fatigue.  + FH of thyroid ds.in sister. No FH of thyroid cancer. No h/o radiation tx to head or neck.  No seaweed or kelp. No recent contrast studies. No herbal supplements. No Biotin use. No recent steroids use.   She has a history of breast cancer diagnosed in 74- sees Dr. Marin Olp.  She also has a history of HTN.  ROS: Constitutional: no weight  gain/no weight loss, no fatigue, no subjective hyperthermia, no subjective hypothermia Eyes: no blurry vision, no xerophthalmia ENT: no sore throat, + see HPI Cardiovascular: no CP/no SOB/no palpitations/no leg swelling Respiratory: no cough/no SOB/no wheezing Gastrointestinal: no N/no V/no D/no C/no acid reflux Musculoskeletal: no muscle aches/no joint aches Skin: no rashes, no hair loss Neurological: no tremors/no numbness/no tingling/no dizziness  I reviewed pt's medications, allergies, PMH, social hx, family hx, and changes were documented in the history of present illness. Otherwise, unchanged from my initial visit note.  Past Medical History:  Diagnosis Date  . Acid reflux   . Cancer [facility]) 1995   breast cancer  . Essential hypertension 02/16/2015  . Hypertension   . Obesity 02/16/2015  . Post-operative nausea and vomiting   . Thyroid disease   . Vertigo    Past Surgical History:  Procedure Laterality Date  . ABDOMINAL HYSTERECTOMY    . ESOPHAGEAL DILATION  2006?   Dr.Hayes  . 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 MASTECTOMY Left 2007  . MASTECTOMY     Social History   Socioeconomic History  . Marital status: Single    Spouse name: Not on file  . Number of children: Not on file  . Years of education: Not on file  . Highest education level: Not on file  Occupational History  . Not on file  Social Needs  . Emergency planning/management officer  strain: Not on file  . Food insecurity    Worry: Not on file    Inability: Not on file  . Transportation needs    Medical: Not on file    Non-medical: Not on file  Tobacco Use  . Smoking status: Never Smoker  . Smokeless tobacco: Never Used  . Tobacco comment: never used tobacco  Substance and Sexual Activity  . Alcohol use: No  . Drug use: No  . Sexual activity: Not on file  Lifestyle  . Physical activity    Days per week: Not on file    Minutes per session: Not on file  . Stress: Not on file  Relationships  .  Social Herbalist on phone: Not on file    Gets together: Not on file    Attends religious service: Not on file    Active member of club or organization: Not on file    Attends meetings of clubs or organizations: Not on file    Relationship status: Not on file  . Intimate partner violence    Fear of current or ex partner: Not on file    Emotionally abused: Not on file    Physically abused: Not on file    Forced sexual activity: Not on file  Other Topics Concern  . Not on file  Social History Narrative  . Not on file   Current Outpatient Medications on File Prior to Visit  Medication Sig Dispense Refill  . cholecalciferol (VITAMIN D) 1000 units tablet Take 1,000 Units by mouth daily.    . fluticasone (FLONASE) 50 MCG/ACT nasal spray Place 1 spray into both nostrils daily as needed for allergies or rhinitis.    Marland Kitchen ibuprofen (ADVIL,MOTRIN) 600 MG tablet Take 1 tablet (600 mg total) by mouth every 6 (six) hours as needed (chest wall pain). (Patient not taking: Reported on 09/06/2018) 21 tablet 0  . levothyroxine (SYNTHROID, LEVOTHROID) 112 MCG tablet Take 1 tablet daily before breakfast (Patient taking differently: Take 125 mcg by mouth daily before breakfast. ) 30 tablet 5  . lisinopril-hydrochlorothiazide (PRINZIDE,ZESTORETIC) 10-12.5 MG per tablet Take 1 tablet by mouth daily. 30 tablet 2  . omeprazole (PRILOSEC) 20 MG capsule Take 1 capsule (20 mg total) by mouth daily. 30 capsule 6  . PROVENTIL HFA 108 (90 Base) MCG/ACT inhaler Inhale 2 puffs into the lungs every 4 (four) hours as needed for wheezing or shortness of breath.   3   Current Facility-Administered Medications on File Prior to Visit  Medication Dose Route Frequency Provider Last Rate Last Dose  . Alcohol (DEHYDRATED ALCOHOL 98%) 98 % injection SOLN 5 mL  5 mL Intravenous Once Docia Barrier, PA       Allergies  Allergen Reactions  . Aspirin Swelling  . Sulfur Nausea Only   Family History  Problem  Relation Age of Onset  . Asthma Mother   . Diabetes Father   . Breast cancer Paternal Aunt   . Stomach cancer Paternal Aunt   . Colon cancer Neg Hx   . Colon polyps Neg Hx   . Esophageal cancer Neg Hx   . Rectal cancer Neg Hx     PE: There were no vitals taken for this visit. Wt Readings from Last 3 Encounters:  03/20/19 211 lb (95.7 kg)  07/07/18 202 lb (91.6 kg)  06/24/18 209 lb (94.8 kg)   Constitutional: overweight, in NAD Eyes: PERRLA, EOMI, no exophthalmos ENT: moist mucous membranes, no thyromegaly but left-sided thyroid  nodule palpated, no cervical lymphadenopathy Cardiovascular: RRR, No MRG Respiratory: CTA B Gastrointestinal: abdomen soft, NT, ND, BS+ Musculoskeletal: no deformities, strength intact in all 4 Skin: moist, warm, no rashes Neurological: no tremor with outstretched hands, DTR normal in all 4  ASSESSMENT: 1. Thyroid nodule  2.  Hypothyroidism  PLAN: 1. Thyroid nodule -Patient with history of large left dominant thyroid nodule, almost entirely cystic, now status post drainage of the cyst with alcohol instillation in 03/2018, since previous aspirations did not prevent the cyst to reform. -I reviewed the images of her thyroid ultrasound and they show that the nodule does not have any concerning features: It does not have microcalcifications, internal blood flow, taller than wide distribution and infiltration in the surrounding tissue.  Also, she does not have a thyroid cancer family history or personal history of radiation therapy to head or neck.  All these would favor benignity.  Her previous biopsies have been inconclusive but I believe that this is due to the mostly cystic nature of the nodule.  I do not feel that she absolutely needs surgery for this unless the cyst reforms after the last aspiration. -At last visit, patient was telling me that she felt choked occasionally especially if she lies on her left side.  However, at this visit she does not have  any neck compression symptoms.  We did discuss that if she develops neck compression symptoms again and the nodule enlarges after aspiration, we may need to do thyroid lobectomy in the future - I will see her back in a year  2. Hypothyroidism - latest thyroid labs reviewed with pt >> TSH was suppressed 08/2018, at which time her levothyroxine dose was decreased - she continues on LT4 112 mcg daily - pt feels good on this dose. - we discussed about taking the thyroid hormone every day, with water, >30 minutes before breakfast, separated by >4 hours from acid reflux medications, calcium, iron, multivitamins. Pt. is taking it correctly. - will check thyroid tests today: TSH and fT4 - If labs are abnormal, she will need to return for repeat TFTs in 1.5 months  Philemon Kingdom, MD PhD [facility] Endocrinology

## 2019-03-12 ENCOUNTER — Encounter: Payer: Medicaid Other | Admitting: Physical Therapy

## 2019-03-20 ENCOUNTER — Other Ambulatory Visit: Payer: Self-pay

## 2019-03-20 ENCOUNTER — Ambulatory Visit (INDEPENDENT_AMBULATORY_CARE_PROVIDER_SITE_OTHER): Payer: Medicaid Other | Admitting: Internal Medicine

## 2019-03-20 ENCOUNTER — Encounter: Payer: Self-pay | Admitting: Internal Medicine

## 2019-03-20 VITALS — BP 122/80 | HR 88 | Ht 64.75 in | Wt 211.0 lb

## 2019-03-20 DIAGNOSIS — E041 Nontoxic single thyroid nodule: Secondary | ICD-10-CM | POA: Diagnosis not present

## 2019-03-20 DIAGNOSIS — E890 Postprocedural hypothyroidism: Secondary | ICD-10-CM | POA: Diagnosis not present

## 2019-03-20 LAB — TSH: TSH: 0.11 u[IU]/mL — ABNORMAL LOW (ref 0.35–4.50)

## 2019-03-20 LAB — T4, FREE: Free T4: 1.45 ng/dL (ref 0.60–1.60)

## 2019-03-20 MED ORDER — LEVOTHYROXINE SODIUM 112 MCG PO TABS: ORAL_TABLET | ORAL | 5 refills | Status: DC

## 2019-03-20 NOTE — Progress Notes (Addendum)
Patient ID: [name], female   DOB: 02/10/1961, 58 y.o.   MRN: [id number]    HPI  [name] is a 58 y.o.-year-old female, initially referred by her PCP, Dr. 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  Last visit a year ago.  Left thyroid nodule  -Diagnosed in 2015.  Reviewed and addended previous investigations: FNA (09/15/2013): AUS FNA (01/14/2014): FLUS Thyroid U/S (10/04/2017): Complex left lobe nodule 1 measures 4.9 x 3.9 x 3.9 cm and previously measured 3.6 x 2.9 x 3.0 cm. Biopsy has been performed previously. At last visit, I referred her for alcohol instillation in the cyst (Dr. Kathlene Cote): FNA + EtOH instillation (03/31/2018):  Scant ep. 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  Hypothyroidism: -Post ablation -Not well controlled.  In the last 1.5 years, we have been decreasing the dose of levothyroxine from 175 to 112 mcg daily. However, she was given 125 mcg from the pharmacy... (?)  Pt is on levothyroxine 125 mcg daily: - in am - fasting - at least 30 min from b'fast - no Ca, Fe, MVI - + PPIs (Omeprazole) >4h after LT4 - not on Biotin  Reviewed her TFTs: Lab Results  Component Value Date   TSH 0.112 (L) 09/06/2018   TSH 0.13 (L) 04/28/2018   TSH <0.01 Repeated and verified X2. (L) 03/10/2018   TSH 0.088 (L) 01/01/2018   TSH 42.838 (H) 07/03/2017   FREET4 1.21 04/28/2018   FREET4 1.58 03/10/2018    She continues to experience hot flashes and fatigue.  She has a family history of thyroid disease in her sister. No FH of thyroid cancer. No h/o radiation tx to head or neck.  No herbal supplements. No Biotin use. No recent steroids use.   She has a history of breast cancer diagnosed in 89- sees Dr. Marin Olp.  She also has a history of  HTN.  ROS: Constitutional: + weight gain/no weight loss, no fatigue, no subjective hyperthermia, no subjective hypothermia Eyes: no blurry vision, no xerophthalmia ENT: no sore throat, + see HPI Cardiovascular: no CP/no SOB/+ palpitations/no leg swelling Respiratory: no cough/no SOB/no wheezing Gastrointestinal: no N/no V/no D/no C/no acid reflux Musculoskeletal: no muscle aches/no joint aches Skin: no rashes, no hair loss Neurological: no tremors/no numbness/no tingling/no dizziness  I reviewed pt's medications, allergies, PMH, social hx, family hx, and changes were documented in the history of present illness. Otherwise, unchanged from my initial visit note.  Past Medical History:  Diagnosis Date  . Acid reflux   . Cancer [facility]) 1995   breast cancer  . Essential hypertension 02/16/2015  . Hypertension   . Obesity 02/16/2015  . Post-operative nausea and vomiting   . Thyroid disease   . Vertigo    Past Surgical History:  Procedure Laterality Date  . ABDOMINAL HYSTERECTOMY    . ESOPHAGEAL DILATION  2006?   Dr.Hayes  . 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 MASTECTOMY Left 2007  . MASTECTOMY     Social History   Socioeconomic History  . Marital status: Single    Spouse name: Not on file  . Number of children: Not on file  . Years of education: Not on file  . Highest education level: Not on file  Occupational History  . Not  on file  Social Needs  . Financial resource strain: Not on file  . Food insecurity    Worry: Not on file    Inability: Not on file  . Transportation needs    Medical: Not on file    Non-medical: Not on file  Tobacco Use  . Smoking status: Never Smoker  . Smokeless tobacco: Never Used  . Tobacco comment: never used tobacco  Substance and Sexual Activity  . Alcohol use: No  . Drug use: No  . Sexual activity: Not on file  Lifestyle  . Physical activity    Days per week: Not on file    Minutes per session: Not on file  .  Stress: Not on file  Relationships  . Social Herbalist on phone: Not on file    Gets together: Not on file    Attends religious service: Not on file    Active member of club or organization: Not on file    Attends meetings of clubs or organizations: Not on file    Relationship status: Not on file  . Intimate partner violence    Fear of current or ex partner: Not on file    Emotionally abused: Not on file    Physically abused: Not on file    Forced sexual activity: Not on file  Other Topics Concern  . Not on file  Social History Narrative  . Not on file   Current Outpatient Medications on File Prior to Visit  Medication Sig Dispense Refill  . cholecalciferol (VITAMIN D) 1000 units tablet Take 1,000 Units by mouth daily.    . fluticasone (FLONASE) 50 MCG/ACT nasal spray Place 1 spray into both nostrils daily as needed for allergies or rhinitis.    Marland Kitchen ibuprofen (ADVIL,MOTRIN) 600 MG tablet Take 1 tablet (600 mg total) by mouth every 6 (six) hours as needed (chest wall pain). (Patient not taking: Reported on 09/06/2018) 21 tablet 0  . levothyroxine (SYNTHROID, LEVOTHROID) 112 MCG tablet Take 1 tablet daily before breakfast (Patient taking differently: Take 125 mcg by mouth daily before breakfast. ) 30 tablet 5  . lisinopril-hydrochlorothiazide (PRINZIDE,ZESTORETIC) 10-12.5 MG per tablet Take 1 tablet by mouth daily. 30 tablet 2  . omeprazole (PRILOSEC) 20 MG capsule Take 1 capsule (20 mg total) by mouth daily. 30 capsule 6  . PROVENTIL HFA 108 (90 Base) MCG/ACT inhaler Inhale 2 puffs into the lungs every 4 (four) hours as needed for wheezing or shortness of breath.   3   Current Facility-Administered Medications on File Prior to Visit  Medication Dose Route Frequency Provider Last Rate Last Dose  . Alcohol (DEHYDRATED ALCOHOL 98%) 98 % injection SOLN 5 mL  5 mL Intravenous Once Docia Barrier, PA       Allergies  Allergen Reactions  . Aspirin Swelling  . Sulfur  Nausea Only   Family History  Problem Relation Age of Onset  . Asthma Mother   . Diabetes Father   . Breast cancer Paternal Aunt   . Stomach cancer Paternal Aunt   . Colon cancer Neg Hx   . Colon polyps Neg Hx   . Esophageal cancer Neg Hx   . Rectal cancer Neg Hx     PE: BP 122/80   Pulse 88   Ht 5' 4.75" (1.645 m)   Wt 211 lb (95.7 kg)   SpO2 99%   BMI 35.38 kg/m  Wt Readings from Last 3 Encounters:  03/20/19 211 lb (95.7 kg)  07/07/18 202 lb (91.6 kg)  06/24/18 209 lb (94.8 kg)   Constitutional: overweight, in NAD Eyes: PERRLA, EOMI, no exophthalmos ENT: moist mucous membranes, no thyromegaly, no cervical lymphadenopathy Cardiovascular: RRR, No MRG Respiratory: CTA B Gastrointestinal: abdomen soft, NT, ND, BS+ Musculoskeletal: no deformities, strength intact in all 4 Skin: moist, warm, no rashes Neurological: no tremor with outstretched hands, DTR normal in all 4  ASSESSMENT: 1. Thyroid nodule  2.  Hypothyroidism  PLAN: 1. Thyroid nodule -Patient with history of a large left dominant nodule, almost entirely cystic, now status post drainage of the cyst with alcoholic instillation in [id number], since previous aspirations did not prevent the cyst to reform. -I reviewed the images of her thyroid ultrasound and they show that the nodule does not have any concerning features: No calcifications, internal blood flow, taller than wide distribution and infiltration in the surrounding tissue.  Also, she does not have a thyroid cancer family history or personal history of radiation therapy to head or neck.  All these would favor benignity.  Her previous biopsies have been inconclusive but this is most likely due to the cystic nature of the nodule.  I do not feel that she absolutely needs surgery for this unless the cyst reforms after last aspiration. -At last visit, patient was telling me that she felt choked occasionally especially if she lied on her left side.  However, at this  visit, she does not have neck compression symptoms.  We discussed that if these develop, and the nodule enlarges again after aspiration, we may need to do thyroid lobectomy in the future. - I plan to repeat the U/S in few years -I will see her back in 1 year  2. Hypothyroidism - latest thyroid labs reviewed with pt >> TSH was suppressed in 08/2018, after which her LT4 dose was decreased - she was not given the lower dose from the pharmacy (!) - she continues on LT4 125 mcg daily - pt feels good on this dose, but c/o weight gain and occasional palpitations - we discussed about taking the thyroid hormone every day, with water, >30 minutes before breakfast, separated by >4 hours from acid reflux medications, calcium, iron, multivitamins. Pt. is taking it correctly. - will check thyroid tests today: TSH and fT4 - If labs are abnormal, she will need to return for repeat TFTs in 1.5 months  - time spent with the patient: 25 min, of which >50% was spent in obtaining information about her symptoms, reviewing her previous labs, evaluations, and treatments, counseling her about her conditions (please see the discussed topics above), and developing a plan to further investigate and treat them; she had a number of questions which I addressed.  Component     Latest Ref Rng & Units 03/20/2019  TSH     0.35 - 4.50 uIU/mL 0.11 (L)  T4,Free(Direct)     0.60 - 1.60 ng/dL 1.45   TSH is again suppressed on this dose so we need to decrease the dose to 112 mcg daily.  I will send this dose to her pharmacy and have her back for repeat in 1.5 months.  Philemon Kingdom, MD PhD [facility] Endocrinology

## 2019-03-20 NOTE — Patient Instructions (Signed)
Please stop at the lab.  Take the thyroid hormone every day, with water, at least 30 minutes before breakfast, separated by at least 4 hours from: - acid reflux medications - calcium - iron - multivitamins  Please come back for a follow-up appointment in 1 year.

## 2019-03-24 ENCOUNTER — Other Ambulatory Visit: Payer: Self-pay

## 2019-03-24 ENCOUNTER — Telehealth: Payer: Self-pay

## 2019-03-24 NOTE — Telephone Encounter (Signed)
-----   Message from Philemon Kingdom, MD sent at 03/20/2019  4:56 PM EDT ----- [name], can you please call pt: TSH is again suppressed on this dose so we need to decrease the dose to 112 mcg daily.  I will send this dose to her pharmacy and have her back for repeat in 1.5 months.  Labs are in.

## 2019-03-24 NOTE — Telephone Encounter (Signed)
Notified patient of message from Dr. Gherghe, patient expressed understanding and agreement. No further questions.  

## 2019-03-26 ENCOUNTER — Encounter: Payer: Self-pay | Admitting: Internal Medicine

## 2019-05-18 ENCOUNTER — Other Ambulatory Visit: Payer: Self-pay | Admitting: Hematology & Oncology

## 2019-05-18 DIAGNOSIS — Z1231 Encounter for screening mammogram for malignant neoplasm of breast: Secondary | ICD-10-CM

## 2019-05-28 ENCOUNTER — Other Ambulatory Visit: Payer: Self-pay | Admitting: *Deleted

## 2019-05-28 DIAGNOSIS — C50912 Malignant neoplasm of unspecified site of left female breast: Secondary | ICD-10-CM

## 2019-05-29 ENCOUNTER — Inpatient Hospital Stay: Payer: Medicaid Other | Attending: Hematology & Oncology

## 2019-05-29 ENCOUNTER — Inpatient Hospital Stay (HOSPITAL_BASED_OUTPATIENT_CLINIC_OR_DEPARTMENT_OTHER): Payer: Medicaid Other | Admitting: Hematology & Oncology

## 2019-05-29 ENCOUNTER — Encounter: Payer: Self-pay | Admitting: Hematology & Oncology

## 2019-05-29 ENCOUNTER — Other Ambulatory Visit: Payer: Self-pay

## 2019-05-29 VITALS — BP 120/82 | HR 80 | Temp 97.5°F | Resp 19 | Wt 208.0 lb

## 2019-05-29 DIAGNOSIS — E041 Nontoxic single thyroid nodule: Secondary | ICD-10-CM | POA: Insufficient documentation

## 2019-05-29 DIAGNOSIS — Z923 Personal history of irradiation: Secondary | ICD-10-CM | POA: Insufficient documentation

## 2019-05-29 DIAGNOSIS — R0602 Shortness of breath: Secondary | ICD-10-CM | POA: Insufficient documentation

## 2019-05-29 DIAGNOSIS — C50912 Malignant neoplasm of unspecified site of left female breast: Secondary | ICD-10-CM

## 2019-05-29 DIAGNOSIS — Z79899 Other long term (current) drug therapy: Secondary | ICD-10-CM | POA: Diagnosis not present

## 2019-05-29 DIAGNOSIS — Z853 Personal history of malignant neoplasm of breast: Secondary | ICD-10-CM | POA: Insufficient documentation

## 2019-05-29 DIAGNOSIS — M791 Myalgia, unspecified site: Secondary | ICD-10-CM | POA: Insufficient documentation

## 2019-05-29 DIAGNOSIS — K219 Gastro-esophageal reflux disease without esophagitis: Secondary | ICD-10-CM | POA: Diagnosis not present

## 2019-05-29 LAB — CMP (CANCER CENTER ONLY)
ALT: 12 U/L (ref 0–44)
AST: 13 U/L — ABNORMAL LOW (ref 15–41)
Albumin: 4.3 g/dL (ref 3.5–5.0)
Alkaline Phosphatase: 74 U/L (ref 38–126)
Anion gap: 9 (ref 5–15)
BUN: 14 mg/dL (ref 6–20)
CO2: 27 mmol/L (ref 22–32)
Calcium: 9.1 mg/dL (ref 8.9–10.3)
Chloride: 103 mmol/L (ref 98–111)
Creatinine: 0.93 mg/dL (ref 0.44–1.00)
GFR, Est AFR Am: >60 mL/min (ref >60)
GFR, Estimated: >60 mL/min (ref >60)
Glucose, Bld: 125 mg/dL — ABNORMAL HIGH (ref 70–99)
Potassium: 3.9 mmol/L (ref 3.5–5.1)
Sodium: 139 mmol/L (ref 135–145)
Total Bilirubin: 0.2 mg/dL — ABNORMAL LOW (ref 0.3–1.2)
Total Protein: 7.4 g/dL (ref 6.5–8.1)

## 2019-05-29 LAB — CBC WITH DIFFERENTIAL (CANCER CENTER ONLY)
Abs Immature Granulocytes: 0.01 10*3/uL (ref 0.00–0.07)
Basophils Absolute: 0 10*3/uL (ref 0.0–0.1)
Basophils Relative: 0 %
Eosinophils Absolute: 0.2 10*3/uL (ref 0.0–0.5)
Eosinophils Relative: 3 %
HCT: 38.5 % (ref 36.0–46.0)
Hemoglobin: 12.3 g/dL (ref 12.0–15.0)
Immature Granulocytes: 0 %
Lymphocytes Relative: 43 %
Lymphs Abs: 2.5 10*3/uL (ref 0.7–4.0)
MCH: 26.6 pg (ref 26.0–34.0)
MCHC: 31.9 g/dL (ref 30.0–36.0)
MCV: 83.2 fL (ref 80.0–100.0)
Monocytes Absolute: 0.3 10*3/uL (ref 0.1–1.0)
Monocytes Relative: 6 %
Neutro Abs: 2.8 10*3/uL (ref 1.7–7.7)
Neutrophils Relative %: 48 %
Platelet Count: 255 10*3/uL (ref 150–400)
RBC: 4.63 MIL/uL (ref 3.87–5.11)
RDW: 12.7 % (ref 11.5–15.5)
WBC Count: 5.8 10*3/uL (ref 4.0–10.5)
nRBC: 0 % (ref 0.0–0.2)

## 2019-05-29 NOTE — Progress Notes (Signed)
Hematology and Oncology Follow Up Visit  [name] [id number] 12-Sep-1960 58 y.o. 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  Is been a year and a half since we last saw her.  She actually is doing quite well.  Her weight is up just a little bit but really not all that bad.  She mostly is bothered by the fact that she has little bit of fluid in the left axilla.  This is been chronic from her mastectomy.  She has had no problems with fever.  She has been very cautious with the coronavirus.  She has had no nausea or vomiting.  There has been no change in bowel or bladder habits.  There has been no bleeding.  She is due for mammogram in February.  She had a very quiet Thanksgiving.  She will have a very quiet Christmas.  Overall, her performance status is ECOG 1.   Medications:  Current Outpatient Medications:  .  levothyroxine (SYNTHROID) 100 MCG tablet, Take 100 mcg by mouth daily before breakfast., Disp: , Rfl:  .  cholecalciferol (VITAMIN D) 1000 units tablet, Take 1,000 Units by mouth daily., Disp: , Rfl:  .  fluticasone (FLONASE) 50 MCG/ACT nasal spray, Place 1 spray into both nostrils daily as needed for allergies or rhinitis., Disp: , Rfl:  .  lisinopril (ZESTRIL) 10 MG tablet, Take 10 mg by mouth daily., Disp: , Rfl:  .  lisinopril-hydrochlorothiazide (PRINZIDE,ZESTORETIC) 10-12.5 MG per tablet, Take 1 tablet by mouth daily., Disp: 30 tablet, Rfl: 2 .  omeprazole (PRILOSEC) 20 MG capsule, Take 1 capsule (20 mg total) by mouth daily., Disp: 30 capsule, Rfl: 6 .  PROVENTIL HFA 108 (90 Base) MCG/ACT inhaler, Inhale 2 puffs into the lungs every 4 (four) hours as needed for wheezing or shortness of breath. , Disp: , Rfl: 3 No current facility-administered medications for this  visit.  Facility-Administered Medications Ordered in Other Visits:  .  Alcohol (DEHYDRATED ALCOHOL 98%) 98 % injection SOLN 5 mL, 5 mL, Intravenous, Once, Zigmund Daniel, Herma Carson, PA  Allergies:  Allergies  Allergen Reactions  . Aspirin Swelling  . Sulfur Nausea Only    Past Medical History, Surgical history, Social history, and Family History were reviewed and updated.  Review of Systems: Review of Systems  Constitutional: Negative.   HENT:  Negative.   Eyes: Negative.   Respiratory: Positive for shortness of breath.   Cardiovascular: Negative.   Gastrointestinal: Negative.   Endocrine: Negative.   Genitourinary: Negative.    Musculoskeletal: Positive for myalgias.  Skin: Negative.   Neurological: Negative.   Hematological: Negative.   Psychiatric/Behavioral: Negative.     Physical Exam:  weight is 208 lb (94.3 kg). Her oral temperature is 97.5 F (36.4 C) (abnormal). Her blood pressure is 120/82 and her pulse is 80. Her respiration is 19 and oxygen saturation is 100%.   Wt Readings from Last 3 Encounters:  05/29/19 208 lb (94.3 kg)  03/20/19 211 lb (95.7 kg)  07/07/18 202 lb (91.6 kg)    Physical Exam Vitals reviewed.  HENT:     Head: Normocephalic and atraumatic.  Eyes:     Pupils: Pupils are equal, round, and reactive to light.  Cardiovascular:     Rate and Rhythm: Normal rate and regular rhythm.  Heart sounds: Normal heart sounds.  Pulmonary:     Effort: Pulmonary effort is normal.     Breath sounds: Normal breath sounds.  Abdominal:     General: Bowel sounds are normal.     Palpations: Abdomen is soft.  Musculoskeletal:        General: No tenderness or deformity. Normal range of motion.     Cervical back: Normal range of motion.  Lymphadenopathy:     Cervical: No cervical adenopathy.  Skin:    General: Skin is warm and dry.     Findings: No erythema or rash.  Neurological:     Mental Status: She is alert and oriented to person, place, and  time.  Psychiatric:        Behavior: Behavior normal.        Thought Content: Thought content normal.        Judgment: Judgment normal.      Lab Results  Component Value Date   WBC 5.8 05/29/2019   HGB 12.3 05/29/2019   HCT 38.5 05/29/2019   MCV 83.2 05/29/2019   PLT 255 05/29/2019     Chemistry      Component Value Date/Time   NA 139 05/29/2019 1039   NA 139 12/15/2014 0834   K 3.9 05/29/2019 1039   K 4.1 12/15/2014 0834   CL 103 05/29/2019 1039   CL 101 10/28/2013 1150   CO2 27 05/29/2019 1039   CO2 22 12/15/2014 0834   BUN 14 05/29/2019 1039   BUN 13.0 12/15/2014 0834   CREATININE 0.93 05/29/2019 1039   CREATININE 0.8 12/15/2014 0834      Component Value Date/Time   CALCIUM 9.1 05/29/2019 1039   CALCIUM 9.0 12/15/2014 0834   ALKPHOS 74 05/29/2019 1039   ALKPHOS 81 12/15/2014 0834   AST 13 (L) 05/29/2019 1039   AST 16 12/15/2014 0834   ALT 12 05/29/2019 1039   ALT 13 12/15/2014 0834   BILITOT 0.2 (L) 05/29/2019 1039   BILITOT 0.31 12/15/2014 0834         Impression and Plan: [name] is a 58 year old postmenopausal African-American female.  She has had a remote history of recurrent adenocarcinoma of the left breast.  She underwent radiation for this recurrence after surgery.  As far as her breast cancer is concerned, she has no obvious recurrent disease.  Everything looks fantastic.  We will plan to get her back in 1 year.   Volanda Napoleon, MD 12/18/202011:14 AM

## 2019-06-27 ENCOUNTER — Other Ambulatory Visit: Payer: Self-pay

## 2019-06-27 DIAGNOSIS — Z20822 Contact with and (suspected) exposure to covid-19: Secondary | ICD-10-CM

## 2019-06-28 LAB — NOVEL CORONAVIRUS, NAA: SARS-CoV-2, NAA: NOT DETECTED

## 2019-08-03 ENCOUNTER — Ambulatory Visit: Payer: Medicaid Other

## 2019-09-08 ENCOUNTER — Other Ambulatory Visit: Payer: Self-pay

## 2019-09-08 ENCOUNTER — Ambulatory Visit
Admission: RE | Admit: 2019-09-08 | Discharge: 2019-09-08 | Disposition: A | Discharge status: Home / Self Care | Payer: Medicaid Other | Source: Ambulatory Visit | Attending: Hematology & Oncology | Admitting: Hematology & Oncology

## 2019-09-08 DIAGNOSIS — Z1231 Encounter for screening mammogram for malignant neoplasm of breast: Secondary | ICD-10-CM

## 2019-10-05 ENCOUNTER — Telehealth: Payer: Self-pay | Admitting: *Deleted

## 2019-10-05 NOTE — Telephone Encounter (Signed)
Call received from patient wanting to know what kind of cancer she has.  Pt notified per Dr. Antonieta Pert office note that she has "mucinous adenocarcinoma".  Pt appreciative of information, appts for 05/2020 reviewed with patient.  Pt has no further questions at this time.

## 2019-10-19 ENCOUNTER — Telehealth: Payer: Self-pay | Admitting: Hematology & Oncology

## 2019-10-19 NOTE — Telephone Encounter (Signed)
Faxed medical records to: [facility] Kyung Rudd: [id number] [name] Mar 03, 1961 CLAIM: [id number]     COPY SCANNED

## 2020-03-08 ENCOUNTER — Telehealth: Payer: Self-pay | Admitting: Hematology & Oncology

## 2020-03-08 NOTE — Telephone Encounter (Signed)
Faxed medical records to: [facility] [place] [facility] 06/11/2017-present  for    [name]  [birth year]  CASE: [id number]      COPY SCANNED

## 2020-03-22 ENCOUNTER — Encounter: Payer: Self-pay | Admitting: Internal Medicine

## 2020-03-22 ENCOUNTER — Other Ambulatory Visit: Payer: Self-pay

## 2020-03-22 ENCOUNTER — Ambulatory Visit (INDEPENDENT_AMBULATORY_CARE_PROVIDER_SITE_OTHER): Payer: Medicaid Other | Admitting: Internal Medicine

## 2020-03-22 VITALS — BP 128/90 | HR 91 | Ht 64.0 in | Wt 213.8 lb

## 2020-03-22 DIAGNOSIS — E041 Nontoxic single thyroid nodule: Secondary | ICD-10-CM

## 2020-03-22 DIAGNOSIS — E890 Postprocedural hypothyroidism: Secondary | ICD-10-CM

## 2020-03-22 LAB — T4, FREE: Free T4: 0.95 ng/dL (ref 0.60–1.60)

## 2020-03-22 LAB — TSH: TSH: 3.39 u[IU]/mL (ref 0.35–4.50)

## 2020-03-22 NOTE — Patient Instructions (Signed)
Please stop at the lab. ? ?Continue Levothyroxine 100 mcg daily. ? ?Take the thyroid hormone every day, with water, at least 30 minutes before breakfast, separated by at least 4 hours from: ?- acid reflux medications ?- calcium ?- iron ?- multivitamins ? ?Please come back for a follow-up appointment in 1 year. ? ?

## 2020-03-22 NOTE — Progress Notes (Signed)
Patient ID: [name], female   DOB: 08-21-1960, 59 y.o.   MRN: [id number]   This visit occurred during the SARS-CoV-2 public health emergency.  Safety protocols were in place, including screening questions prior to the visit, additional usage of staff PPE, and extensive cleaning of exam room while observing appropriate contact time as indicated for disinfecting solutions.   HPI  [name] is a 59 y.o.-year-old female, initially referred by her PCP, Dr. 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  Last visit 1 year ago.  Left thyroid nodule -Diagnosed in 2015  Reviewed previous work-up and treatments: FNA (09/15/2013): AUS FNA (01/14/2014): FLUS Thyroid U/S (10/04/2017): Complex left lobe nodule 1 measures 4.9 x 3.9 x 3.9 cm and previously measured 3.6 x 2.9 x 3.0 cm. Biopsy has been performed previously. At last visit, I referred her for alcohol instillation in the cyst (Dr. Kathlene Cote): FNA + EtOH instillation (03/31/2018):  Scant ep. 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  Hypothyroidism: -Post ablation -Not well controlled.  In the last 1.5 years, we have been decreasing the dose of levothyroxine from 175 to 112 mcg daily. However, at last visit, she was given 125 mcg from the pharmacy. We decreased the dose back to 112 mcg daily, but unfortunately, she did not come back for labs afterwards.  However, her dose was decreased to 100 mcg daily by PCP - cannot remember when... (I was not aware of new labs or the dose change)  She takes the LT4 - has choking when she takes the pill (not encountered with other medicines): - in am - fasting - at least 30 min from b'fast - no Ca, Fe, MVI, + PPIs more than 4 hours after levothyroxine - not on Biotin  Reviewed her TFTs - did not come back  for labs after the last dose change: Lab Results  Component Value Date   TSH 0.11 (L) 03/20/2019   TSH 0.112 (L) 09/06/2018   TSH 0.13 (L) 04/28/2018   TSH <0.01 Repeated and verified X2. (L) 03/10/2018   TSH 0.088 (L) 01/01/2018   TSH 42.838 (H) 07/03/2017   FREET4 1.45 03/20/2019   FREET4 1.21 04/28/2018   FREET4 1.58 03/10/2018    She continues to experience hot flashes and fatigue, but only occasionally.  She has a family history of thyroid disease in her sister. No FH of thyroid cancer. No h/o radiation tx to head or neck.  No herbal supplements. No Biotin use. No recent steroids use.   She has a history of breast cancer diagnosed in 54- sees Dr. Marin Olp.  She also has a history of HTN.  ROS: Constitutional: + weight gain/no weight loss, no fatigue, no subjective hyperthermia, no subjective hypothermia Eyes: no blurry vision, no xerophthalmia ENT: no sore throat, + see HPI Cardiovascular: no CP/no SOB/no palpitations/no leg swelling Respiratory: no cough/no SOB/no wheezing Gastrointestinal: no N/no V/no D/no C/no acid reflux Musculoskeletal: no muscle aches/no joint aches Skin: no rashes, no hair loss Neurological: no tremors/no numbness/no tingling/no dizziness  I reviewed pt's medications, allergies, PMH, social hx, family hx, and changes were documented in the history of present illness. Otherwise, unchanged from my initial visit note.  Past Medical History:  Diagnosis Date  . Acid reflux   . Cancer [facility]) 1995   breast cancer  .  Essential hypertension 02/16/2015  . Hypertension   . Obesity 02/16/2015  . Post-operative nausea and vomiting   . Thyroid disease   . Vertigo    Past Surgical History:  Procedure Laterality Date  . ABDOMINAL HYSTERECTOMY    . ESOPHAGEAL DILATION  2006?   Dr.Hayes  . 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 MASTECTOMY Left 2007  . MASTECTOMY     Social History   Socioeconomic History  . Marital status: Single     Spouse name: Not on file  . Number of children: Not on file  . Years of education: Not on file  . Highest education level: Not on file  Occupational History  . Not on file  Tobacco Use  . Smoking status: Never Smoker  . Smokeless tobacco: Never Used  . Tobacco comment: never used tobacco  Vaping Use  . Vaping Use: Never used  Substance and Sexual Activity  . Alcohol use: No  . Drug use: No  . Sexual activity: Not on file  Other Topics Concern  . Not on file  Social History Narrative  . Not on file   Social Determinants of Health   Financial Resource Strain:   . Difficulty of Paying Living Expenses: Not on file  Food Insecurity:   . Worried About Charity fundraiser in the Last Year: Not on file  . Ran Out of Food in the Last Year: Not on file  Transportation Needs:   . Lack of Transportation (Medical): Not on file  . Lack of Transportation (Non-Medical): Not on file  Physical Activity:   . Days of Exercise per Week: Not on file  . Minutes of Exercise per Session: Not on file  Stress:   . Feeling of Stress : Not on file  Social Connections:   . Frequency of Communication with Friends and Family: Not on file  . Frequency of Social Gatherings with Friends and Family: Not on file  . Attends Religious Services: Not on file  . Active Member of Clubs or Organizations: Not on file  . Attends Archivist Meetings: Not on file  . Marital Status: Not on file  Intimate Partner Violence:   . Fear of Current or Ex-Partner: Not on file  . Emotionally Abused: Not on file  . Physically Abused: Not on file  . Sexually Abused: Not on file   Current Outpatient Medications on File Prior to Visit  Medication Sig Dispense Refill  . cholecalciferol (VITAMIN D) 1000 units tablet Take 1,000 Units by mouth daily.    . fluticasone (FLONASE) 50 MCG/ACT nasal spray Place 1 spray into both nostrils daily as needed for allergies or rhinitis.    Marland Kitchen levothyroxine (SYNTHROID) 100 MCG  tablet Take 100 mcg by mouth daily before breakfast.    . lisinopril (ZESTRIL) 10 MG tablet Take 10 mg by mouth daily.    Marland Kitchen lisinopril-hydrochlorothiazide (PRINZIDE,ZESTORETIC) 10-12.5 MG per tablet Take 1 tablet by mouth daily. 30 tablet 2  . omeprazole (PRILOSEC) 20 MG capsule Take 1 capsule (20 mg total) by mouth daily. 30 capsule 6  . PROVENTIL HFA 108 (90 Base) MCG/ACT inhaler Inhale 2 puffs into the lungs every 4 (four) hours as needed for wheezing or shortness of breath.   3   Current Facility-Administered Medications on File Prior to Visit  Medication Dose Route Frequency Provider Last Rate Last Admin  . Alcohol (DEHYDRATED ALCOHOL 98%) 98 % injection SOLN 5 mL  5  mL Intravenous Once Docia Barrier, PA       Allergies  Allergen Reactions  . Aspirin Swelling  . Sulfur Nausea Only   Family History  Problem Relation Age of Onset  . Asthma Mother   . Diabetes Father   . Breast cancer Paternal Aunt   . Stomach cancer Paternal Aunt   . Colon cancer Neg Hx   . Colon polyps Neg Hx   . Esophageal cancer Neg Hx   . Rectal cancer Neg Hx     PE: BP 128/90   Pulse 91   Ht 5\' 4"  (1.626 m)   Wt 213 lb 12.8 oz (97 kg)   SpO2 97%   BMI 36.70 kg/m  Wt Readings from Last 3 Encounters:  03/22/20 213 lb 12.8 oz (97 kg)  05/29/19 208 lb (94.3 kg)  03/20/19 211 lb (95.7 kg)   Constitutional: overweight, in NAD Eyes: PERRLA, EOMI, no exophthalmos ENT: moist mucous membranes, no thyromegaly, no cervical lymphadenopathy Cardiovascular: RRR, No MRG Respiratory: CTA B Gastrointestinal: abdomen soft, NT, ND, BS+ Musculoskeletal: no deformities, strength intact in all 4 Skin: moist, warm, no rashes Neurological: no tremor with outstretched hands, DTR normal in all 4  ASSESSMENT: 1. Thyroid nodule  2.  Hypothyroidism  PLAN: 1. Thyroid nodule -Patient with history of a large left dominant nodule, almost entirely cystic, now status post drainage of the cyst with EtOH  instillation in 03/2018, since previous aspirations did not prevent re-forming of the cyst. -I reviewed the images of her thyroid ultrasound and they show that the nodule does not have any concerning features: No calcifications, internal blood flow, taller than wide's distribution, and infiltration in the surrounding tissue. Also, she does not have a thyroid cancer family history or personal history of radiation therapy to head or neck. All these would favor benignity. Her previous biopsies have been inconclusive but this is most likely due to a cystic nature of the nodule. I do not feel that she absolutely needs surgery for this unless the cyst reforms after the last aspiration. -At last visit, patient was telling me that she felt choked occasionally, especially if she lies on her left side, but otherwise she did not feel bothered by dizziness. We did discuss that if more severe compression symptoms occur, may need thyroid lobectomy in the future. -Plan to repeat a thyroid ultrasound if she develops neck compression symptoms in the future -I will see her back in 1 year  2. Hypothyroidism - latest thyroid labs reviewed with pt >> suppressed at last check: Lab Results  Component Value Date   TSH 0.11 (L) 03/20/2019  -After the above results returned, I advised her to decrease the dose of levothyroxine to 112 mcg daily and come back for labs in 5 to 6 weeks. She did not return... At this visit, she tells me that she saw her PCP since our last visit (she cannot remember when) and the dose of levothyroxine was changed to 100 mcg daily.  She has problems swallowing this tablet as it is larger than the previous one that she was taking -choking. - pt feels good on this dose. Gained 5 lbs since last OV. - we discussed about taking the thyroid hormone every day, with water, >30 minutes before breakfast, separated by >4 hours from acid reflux medications, calcium, iron, multivitamins. Pt. is taking it correctly,  but she sometimes takes it later in the day. - will check thyroid tests today: TSH and fT4 -we discussed about  changing to brand name Synthroid to hopefully improve her dysphagia when the levothyroxine tablet. - If labs are abnormal, she will need to return for repeat TFTs in 1.5 months  Needs refills Synthroid DAW.  Orders Only on 03/22/2020  Component Date Value Ref Range Status  . TSH 03/22/2020 3.39  0.35 - 4.50 uIU/mL Final  Office Visit on 03/22/2020  Component Date Value Ref Range Status  . Free T4 03/22/2020 0.95  0.60 - 1.60 ng/dL Final   Comment: Specimens from patients who are undergoing biotin therapy and /or ingesting biotin supplements may contain high levels of biotin.  The higher biotin concentration in these specimens interferes with this Free T4 assay.  Specimens that contain high levels  of biotin may cause false high results for this Free T4 assay.  Please interpret results in light of the total clinical presentation of the patient.     TFTs normal.  We can continue with the same Synthroid dose of 100 mcg daily but since the TSH is higher in the normal range, I would like to repeat the test in 3 months.  Philemon Kingdom, MD PhD [facility] Endocrinology

## 2020-03-23 MED ORDER — SYNTHROID 100 MCG PO TABS: 100.0000 ug | ORAL_TABLET | Freq: Every day | ORAL | 3 refills | Status: DC

## 2020-03-28 ENCOUNTER — Telehealth: Payer: Self-pay

## 2020-03-28 NOTE — Telephone Encounter (Signed)
Informed patient of results and recommendations. 

## 2020-03-28 NOTE — Telephone Encounter (Signed)
-----   Message from Philemon Kingdom, MD sent at 03/23/2020  8:56 AM EDT ----- Can you please call pt.:  Thyroid tests are normal so we can continue with the plan to switch to Synthroid brand name 100 mcg daily.  I sent this to her pharmacy.  However, TSH is higher in the normal range so I would like to repeat her thyroid tests in approximately 3 months.  Labs are in.  She will need a lab appointment for that.

## 2020-05-27 ENCOUNTER — Inpatient Hospital Stay: Payer: Medicaid Other | Admitting: Hematology & Oncology

## 2020-05-27 ENCOUNTER — Telehealth: Payer: Self-pay

## 2020-05-27 ENCOUNTER — Inpatient Hospital Stay: Payer: Medicaid Other

## 2020-05-27 NOTE — Telephone Encounter (Signed)
Pt called in to r.s todays appt to 06/2020.... AOM

## 2020-06-29 ENCOUNTER — Other Ambulatory Visit: Payer: Self-pay

## 2020-06-30 ENCOUNTER — Other Ambulatory Visit: Payer: Medicaid Other

## 2020-07-05 ENCOUNTER — Other Ambulatory Visit: Payer: Self-pay | Admitting: *Deleted

## 2020-07-05 DIAGNOSIS — C50912 Malignant neoplasm of unspecified site of left female breast: Secondary | ICD-10-CM

## 2020-07-06 ENCOUNTER — Inpatient Hospital Stay (HOSPITAL_BASED_OUTPATIENT_CLINIC_OR_DEPARTMENT_OTHER): Payer: Medicaid Other | Admitting: Hematology & Oncology

## 2020-07-06 ENCOUNTER — Inpatient Hospital Stay: Payer: Medicaid Other | Attending: Hematology & Oncology

## 2020-07-06 ENCOUNTER — Ambulatory Visit (HOSPITAL_BASED_OUTPATIENT_CLINIC_OR_DEPARTMENT_OTHER)
Admission: RE | Admit: 2020-07-06 | Discharge: 2020-07-06 | Disposition: A | Discharge status: Home / Self Care | Payer: Medicaid Other | Source: Ambulatory Visit | Attending: Hematology & Oncology | Admitting: Hematology & Oncology

## 2020-07-06 ENCOUNTER — Telehealth: Payer: Self-pay

## 2020-07-06 ENCOUNTER — Encounter: Payer: Self-pay | Admitting: Hematology & Oncology

## 2020-07-06 ENCOUNTER — Other Ambulatory Visit: Payer: Self-pay

## 2020-07-06 VITALS — BP 122/74 | HR 79 | Temp 98.5°F | Resp 20

## 2020-07-06 DIAGNOSIS — E041 Nontoxic single thyroid nodule: Secondary | ICD-10-CM | POA: Diagnosis not present

## 2020-07-06 DIAGNOSIS — R059 Cough, unspecified: Secondary | ICD-10-CM | POA: Diagnosis not present

## 2020-07-06 DIAGNOSIS — Z923 Personal history of irradiation: Secondary | ICD-10-CM | POA: Diagnosis not present

## 2020-07-06 DIAGNOSIS — C50912 Malignant neoplasm of unspecified site of left female breast: Secondary | ICD-10-CM

## 2020-07-06 DIAGNOSIS — Z853 Personal history of malignant neoplasm of breast: Secondary | ICD-10-CM | POA: Insufficient documentation

## 2020-07-06 LAB — CMP (CANCER CENTER ONLY)
ALT: 14 U/L (ref 0–44)
AST: 16 U/L (ref 15–41)
Albumin: 4.5 g/dL (ref 3.5–5.0)
Alkaline Phosphatase: 72 U/L (ref 38–126)
Anion gap: 9 (ref 5–15)
BUN: 16 mg/dL (ref 6–20)
CO2: 27 mmol/L (ref 22–32)
Calcium: 9.6 mg/dL (ref 8.9–10.3)
Chloride: 103 mmol/L (ref 98–111)
Creatinine: 0.85 mg/dL (ref 0.44–1.00)
GFR, Estimated: >60 mL/min (ref >60)
Glucose, Bld: 114 mg/dL — ABNORMAL HIGH (ref 70–99)
Potassium: 4.5 mmol/L (ref 3.5–5.1)
Sodium: 139 mmol/L (ref 135–145)
Total Bilirubin: 0.3 mg/dL (ref 0.3–1.2)
Total Protein: 7.7 g/dL (ref 6.5–8.1)

## 2020-07-06 LAB — CBC WITH DIFFERENTIAL (CANCER CENTER ONLY)
Abs Immature Granulocytes: 0.01 10*3/uL (ref 0.00–0.07)
Basophils Absolute: 0 10*3/uL (ref 0.0–0.1)
Basophils Relative: 0 %
Eosinophils Absolute: 0.1 10*3/uL (ref 0.0–0.5)
Eosinophils Relative: 2 %
HCT: 37.1 % (ref 36.0–46.0)
Hemoglobin: 12.3 g/dL (ref 12.0–15.0)
Immature Granulocytes: 0 %
Lymphocytes Relative: 43 %
Lymphs Abs: 2.1 10*3/uL (ref 0.7–4.0)
MCH: 27.9 pg (ref 26.0–34.0)
MCHC: 33.2 g/dL (ref 30.0–36.0)
MCV: 84.1 fL (ref 80.0–100.0)
Monocytes Absolute: 0.3 10*3/uL (ref 0.1–1.0)
Monocytes Relative: 7 %
Neutro Abs: 2.3 10*3/uL (ref 1.7–7.7)
Neutrophils Relative %: 48 %
Platelet Count: 245 10*3/uL (ref 150–400)
RBC: 4.41 MIL/uL (ref 3.87–5.11)
RDW: 13.1 % (ref 11.5–15.5)
WBC Count: 4.9 10*3/uL (ref 4.0–10.5)
nRBC: 0 % (ref 0.0–0.2)

## 2020-07-06 NOTE — Telephone Encounter (Signed)
appts made and printed for pt per 07/06/20 los   [name] 

## 2020-07-06 NOTE — Progress Notes (Signed)
Hematology and Oncology Follow Up Visit  [name] [id number] Dec 15, 1960 60 y.o. 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  As always, we see her back yearly.  She is doing quite well.  She has had no problems with the coronavirus.  She has not been vaccinated.  I doubt that she ever will get vaccinated.  She has had some slight problems with a cough.  There is been little bit of chest wall discomfort.  She has had a couple episodes of wheezing.  We will have to go ahead and get a chest x-ray on her today.      There is no nausea or vomiting.  She has had no change in bowel or bladder habits.  She did have a colonoscopy a couple years ago.  She did have some polyps and will need another 1 in a year or so.  She has had no rashes.  There has been no leg swelling.  She has a little bit of a edema on the left anterior chest wall.  This is chronic from her mastectomy and lymphadenectomy on the left side.  She has had no headache.  Overall, her performance status is ECOG 1.  .   Medications:  Current Outpatient Medications:  .  benzonatate (TESSALON) 200 MG capsule, Take 200 mg by mouth 3 (three) times daily., Disp: , Rfl:  .  cholecalciferol (VITAMIN D) 1000 units tablet, Take 1,000 Units by mouth daily., Disp: , Rfl:  .  fluticasone (FLONASE) 50 MCG/ACT nasal spray, Place 1 spray into both nostrils daily as needed for allergies or rhinitis., Disp: , Rfl:  .  lisinopril-hydrochlorothiazide (PRINZIDE,ZESTORETIC) 10-12.5 MG per tablet, Take 1 tablet by mouth daily., Disp: 30 tablet, Rfl: 2 .  meclizine (ANTIVERT) 25 MG tablet, Take 25 mg by mouth 3 (three) times daily as needed., Disp: , Rfl:  .  omeprazole (PRILOSEC) 20 MG capsule, Take 1 capsule (20 mg total) by mouth daily., Disp:  30 capsule, Rfl: 6 .  PROVENTIL HFA 108 (90 Base) MCG/ACT inhaler, Inhale 2 puffs into the lungs every 4 (four) hours as needed for wheezing or shortness of breath. , Disp: , Rfl: 3 .  sucralfate (CARAFATE) 1 GM/10ML suspension, SMARTSIG:Milliliter(s) By Mouth, Disp: , Rfl:  .  SYNTHROID 100 MCG tablet, Take 1 tablet (100 mcg total) by mouth daily before breakfast., Disp: 90 tablet, Rfl: 3 No current facility-administered medications for this visit.  Facility-Administered Medications Ordered in Other Visits:  .  Alcohol (DEHYDRATED ALCOHOL 98%) 98 % injection SOLN 5 mL, 5 mL, Intravenous, Once, Zigmund Daniel, Herma Carson, PA  Allergies:  Allergies  Allergen Reactions  . Aspirin Swelling  . Elemental Sulfur Nausea Only    Past Medical History, Surgical history, Social history, and Family History were reviewed and updated.  Review of Systems: Review of Systems  Constitutional: Negative.   HENT:  Negative.   Eyes: Negative.   Respiratory: Positive for shortness of breath.   Cardiovascular: Negative.   Gastrointestinal: Negative.   Endocrine: Negative.   Genitourinary: Negative.    Musculoskeletal: Positive for myalgias.  Skin: Negative.   Neurological: Negative.   Hematological: Negative.   Psychiatric/Behavioral: Negative.     Physical Exam:  temperature is 98.5 F (36.9 C). Her blood pressure is 122/74 and her pulse  is 79. Her respiration is 20 and oxygen saturation is 100%.   Wt Readings from Last 3 Encounters:  03/22/20 213 lb 12.8 oz (97 kg)  05/29/19 208 lb (94.3 kg)  03/20/19 211 lb (95.7 kg)    Physical Exam Vitals reviewed.  HENT:     Head: Normocephalic and atraumatic.  Eyes:     Pupils: Pupils are equal, round, and reactive to light.  Cardiovascular:     Rate and Rhythm: Normal rate and regular rhythm.     Heart sounds: Normal heart sounds.  Pulmonary:     Effort: Pulmonary effort is normal.     Breath sounds: Normal breath sounds.  Abdominal:      General: Bowel sounds are normal.     Palpations: Abdomen is soft.  Musculoskeletal:        General: No tenderness or deformity. Normal range of motion.     Cervical back: Normal range of motion.  Lymphadenopathy:     Cervical: No cervical adenopathy.  Skin:    General: Skin is warm and dry.     Findings: No erythema or rash.  Neurological:     Mental Status: She is alert and oriented to person, place, and time.  Psychiatric:        Behavior: Behavior normal.        Thought Content: Thought content normal.        Judgment: Judgment normal.      Lab Results  Component Value Date   WBC 4.9 07/06/2020   HGB 12.3 07/06/2020   HCT 37.1 07/06/2020   MCV 84.1 07/06/2020   PLT 245 07/06/2020     Chemistry      Component Value Date/Time   NA 139 05/29/2019 1039   NA 139 12/15/2014 0834   K 3.9 05/29/2019 1039   K 4.1 12/15/2014 0834   CL 103 05/29/2019 1039   CL 101 10/28/2013 1150   CO2 27 05/29/2019 1039   CO2 22 12/15/2014 0834   BUN 14 05/29/2019 1039   BUN 13.0 12/15/2014 0834   CREATININE 0.93 05/29/2019 1039   CREATININE 0.8 12/15/2014 0834      Component Value Date/Time   CALCIUM 9.1 05/29/2019 1039   CALCIUM 9.0 12/15/2014 0834   ALKPHOS 74 05/29/2019 1039   ALKPHOS 81 12/15/2014 0834   AST 13 (L) 05/29/2019 1039   AST 16 12/15/2014 0834   ALT 12 05/29/2019 1039   ALT 13 12/15/2014 0834   BILITOT 0.2 (L) 05/29/2019 1039   BILITOT 0.31 12/15/2014 0834      Impression and Plan: [name] is a 61 year old postmenopausal African-American female.  She has had a remote history of recurrent adenocarcinoma of the left breast.  She underwent radiation for this recurrence after surgery.  As far as her breast cancer is concerned, I really doubt that there is a problem with this.  However, given the fact that she has a cough, we will go ahead and get a chest x-ray.  She does not smoke.  I would like to believe everything will be all right.  If so, we will get her  back in 1 year.    Volanda Napoleon, MD 1/26/20229:27 AM

## 2020-07-07 ENCOUNTER — Ambulatory Visit (INDEPENDENT_AMBULATORY_CARE_PROVIDER_SITE_OTHER): Payer: Medicaid Other | Admitting: Internal Medicine

## 2020-07-07 ENCOUNTER — Encounter: Payer: Self-pay | Admitting: Internal Medicine

## 2020-07-07 VITALS — BP 120/82 | HR 91 | Ht 64.0 in | Wt 214.8 lb

## 2020-07-07 DIAGNOSIS — E890 Postprocedural hypothyroidism: Secondary | ICD-10-CM | POA: Diagnosis not present

## 2020-07-07 DIAGNOSIS — E559 Vitamin D deficiency, unspecified: Secondary | ICD-10-CM

## 2020-07-07 DIAGNOSIS — R202 Paresthesia of skin: Secondary | ICD-10-CM | POA: Diagnosis not present

## 2020-07-07 DIAGNOSIS — E041 Nontoxic single thyroid nodule: Secondary | ICD-10-CM

## 2020-07-07 LAB — VITAMIN B12: Vitamin B-12: 511 pg/mL (ref 211–911)

## 2020-07-07 LAB — T4, FREE: Free T4: 1.07 ng/dL (ref 0.60–1.60)

## 2020-07-07 LAB — TSH: TSH: 3.09 u[IU]/mL (ref 0.35–4.50)

## 2020-07-07 NOTE — Progress Notes (Signed)
Patient ID: [name], female   DOB: 11-20-1960, 60 y.o.   MRN: [id number]   This visit occurred during the SARS-CoV-2 public health emergency.  Safety protocols were in place, including screening questions prior to the visit, additional usage of staff PPE, and extensive cleaning of exam room while observing appropriate contact time as indicated for disinfecting solutions.   HPI  [name] is a 60 y.o.-year-old female, initially referred by her PCP, Dr. 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  Last visit 3 mo ago.  Few days ago, she developed abdominal pressure then also tingling and burning in arms and legs.  She would want to make sure that her thyroid tests are normal.  Left thyroid nodule -Diagnosed in 2015  Reviewed prev. Investigations: FNA (09/15/2013): AUS FNA (01/14/2014): FLUS Thyroid U/S (10/04/2017): Complex left lobe nodule 1 measures 4.9 x 3.9 x 3.9 cm and previously measured 3.6 x 2.9 x 3.0 cm. Biopsy has been performed previously. At last visit, I referred her for alcohol instillation in the cyst (Dr. Kathlene Cote): FNA + EtOH instillation (03/31/2018):  Scant ep. 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  Hypothyroidism: -postablation -not well controlled.  In the last 1.5 years, we have been decreasing the dose of levothyroxine from 175 to 112 mcg daily. However, at last visit, she was given 125 mcg from the pharmacy. We then decreased back to 112 mcg daily, then to 100 mcg daily.  She takes Synthroid DAW -as she had choking with the generic levothyroxine in the past (although, upon questioning, she is not sure whether she is given the brand name from the pharmacy) -choking improved. - in am - fasting - at least 30 min from b'fast - no calcium - no iron - no multivitamins -  + PPIs >4h after LT4 - not on Biotin  Reviewed her TFTs: Lab Results  Component Value Date   TSH 3.39 03/22/2020   TSH 0.11 (L) 03/20/2019   TSH 0.112 (L) 09/06/2018   TSH 0.13 (L) 04/28/2018   TSH <0.01 Repeated and verified X2. (L) 03/10/2018   TSH 0.088 (L) 01/01/2018   TSH 42.838 (H) 07/03/2017   FREET4 0.95 03/22/2020   FREET4 1.45 03/20/2019   FREET4 1.21 04/28/2018   FREET4 1.58 03/10/2018    She continues to have hot flushes.  She has a family history of thyroid disease in her sister. No FH of thyroid cancer. No h/o radiation tx to head or neck.  No herbal supplements. No Biotin use. No recent steroids use.   She has a history of breast cancer diagnosed in 81- sees Dr. Marin Olp.  She also has a history of HTN.   She also has vit D def. >> on 1000 units daily, but she missed the last week.  Reviewed vitamin levels: Lab Results  Component Value Date   VD25OH 17.2 (L) 01/01/2018   No results found for: VITAMINB12   ROS: Constitutional: + weight gain (5 lbs)/no weight loss, no fatigue, no subjective hyperthermia, no subjective hypothermia Eyes: no blurry vision, no xerophthalmia ENT: no sore throat, + see HPI Cardiovascular: no CP/no SOB/no palpitations/no leg swelling Respiratory: no cough/no SOB/no wheezing Gastrointestinal: no N/no V/no D/+ C/no acid reflux, + upper abdominal pressure Musculoskeletal: no muscle aches/no joint aches Skin: no rashes, no hair loss Neurological:  no tremors/no numbness/+ tingling and burning/no dizziness/+ paresthesias - was on Gabapentin before  I reviewed pt's medications, allergies, PMH, social hx, family hx, and changes were documented in the history of present illness. Otherwise, unchanged from my initial visit note.  Past Medical History:  Diagnosis Date  . Acid reflux   . Cancer [facility]) 1995   breast cancer  . Essential hypertension 02/16/2015  . Hypertension   . Obesity 02/16/2015  . Post-operative nausea and vomiting    . Thyroid disease   . Vertigo    Past Surgical History:  Procedure Laterality Date  . ABDOMINAL HYSTERECTOMY    . ESOPHAGEAL DILATION  2006?   Dr.Hayes  . 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 MASTECTOMY Left 2007  . MASTECTOMY     Social History   Socioeconomic History  . Marital status: Single    Spouse name: Not on file  . Number of children: Not on file  . Years of education: Not on file  . Highest education level: Not on file  Occupational History  . Not on file  Tobacco Use  . Smoking status: Never Smoker  . Smokeless tobacco: Never Used  . Tobacco comment: never used tobacco  Vaping Use  . Vaping Use: Never used  Substance and Sexual Activity  . Alcohol use: No  . Drug use: No  . Sexual activity: Not on file  Other Topics Concern  . Not on file  Social History Narrative  . Not on file   Social Determinants of Health   Financial Resource Strain: Not on file  Food Insecurity: Not on file  Transportation Needs: Not on file  Physical Activity: Not on file  Stress: Not on file  Social Connections: Not on file  Intimate Partner Violence: Not on file   Current Outpatient Medications on File Prior to Visit  Medication Sig Dispense Refill  . benzonatate (TESSALON) 200 MG capsule Take 200 mg by mouth 3 (three) times daily.    . cholecalciferol (VITAMIN D) 1000 units tablet Take 1,000 Units by mouth daily.    . fluticasone (FLONASE) 50 MCG/ACT nasal spray Place 1 spray into both nostrils daily as needed for allergies or rhinitis.    Marland Kitchen lisinopril-hydrochlorothiazide (PRINZIDE,ZESTORETIC) 10-12.5 MG per tablet Take 1 tablet by mouth daily. 30 tablet 2  . meclizine (ANTIVERT) 25 MG tablet Take 25 mg by mouth 3 (three) times daily as needed.    Marland Kitchen omeprazole (PRILOSEC) 20 MG capsule Take 1 capsule (20 mg total) by mouth daily. 30 capsule 6  . PROVENTIL HFA 108 (90 Base) MCG/ACT inhaler Inhale 2 puffs into the lungs every 4 (four) hours as needed for  wheezing or shortness of breath.   3  . sucralfate (CARAFATE) 1 GM/10ML suspension SMARTSIG:Milliliter(s) By Mouth    . SYNTHROID 100 MCG tablet Take 1 tablet (100 mcg total) by mouth daily before breakfast. 90 tablet 3   Current Facility-Administered Medications on File Prior to Visit  Medication Dose Route Frequency Provider Last Rate Last Admin  . Alcohol (DEHYDRATED ALCOHOL 98%) 98 % injection SOLN 5 mL  5 mL Intravenous Once Docia Barrier, PA       Allergies  Allergen Reactions  . Aspirin Swelling  . Elemental Sulfur Nausea Only   Family History  Problem Relation Age of Onset  . Asthma Mother   . Diabetes Father   . Breast cancer Paternal Aunt   . Stomach cancer Paternal Aunt   .  Colon cancer Neg Hx   . Colon polyps Neg Hx   . Esophageal cancer Neg Hx   . Rectal cancer Neg Hx     PE: BP 120/82   Pulse 91   Ht 5\' 4"  (1.626 m)   Wt 214 lb 12.8 oz (97.4 kg)   SpO2 97%   BMI 36.87 kg/m  Wt Readings from Last 3 Encounters:  07/07/20 214 lb 12.8 oz (97.4 kg)  03/22/20 213 lb 12.8 oz (97 kg)  05/29/19 208 lb (94.3 kg)   Constitutional: overweight, in NAD Eyes: PERRLA, EOMI, no exophthalmos ENT: moist mucous membranes, no thyromegaly, no neck nodules palpable, no cervical lymphadenopathy Cardiovascular: RRR, No MRG Respiratory: CTA B Gastrointestinal: abdomen soft, NT, ND, BS+ Musculoskeletal: no deformities, strength intact in all 4 Skin: moist, warm, no rashes Neurological: no tremor with outstretched hands, DTR normal in all 4  ASSESSMENT: 1. Thyroid nodule  2.  Hypothyroidism  3.  Tingling in the extremities  PLAN: 1. Thyroid nodule -Patient with history of a large left dominant thyroid nodule, almost entirely cystic, status post drainage of the cyst and Atenol instillation in 03/2018, since previous aspirations did not prevent reformation of the cyst.  Reviewing the thyroid ultrasound images, they do not show any concerning nodules features: No  calcifications, no internal blood flow, no taller than wide distribution and also no infiltration in the surrounding tissue.  Also, she does not have a thyroid cancer family history or personal history of radiation therapy to head or neck.  All of these would favor benignity.  Her previous biopsies were inconclusive most likely due to the cystic nature of the nodule.  She likely does not need surgery for this unless the cyst reforms after the last aspiration. -In the past, she occasionally had choking, but unclear if related to the thyroid nodule. -At this visit, she does not describe neck compression sxs -Plan to repeat a thyroid ultrasound if he develops neck compression symptoms in the future -I will see her back in 1 year  2. Hypothyroidism - latest thyroid labs reviewed with pt >> normal: Lab Results  Component Value Date   TSH 3.39 03/22/2020   - she continues on LT4 100 mcg daily - pt feels good on this dose, however, she had choking with the tablet (?) at last visit so we switched her to brand name Synthroid. - we discussed about taking the thyroid hormone every day, with water, >30 minutes before breakfast, separated by >4 hours from acid reflux medications, calcium, iron, multivitamins. Pt. is taking it correctly.  At last visit and again today, she tells me that, she forgets to take the tablet in the morning, she occasionally taking the tablet later in the day.  I advised her to make sure that she is taking this fasting, if she needs to take it later in the day - will check thyroid tests today: TSH and fT4 - If labs are abnormal, she will need to return for repeat TFTs in 1.5 months  3.  Tingling in extremities -We will check her TFTs at this time -I will also check a vitamin B12 and vitamin D.  She does have a history of vitamin D deficiency. -She also describes some pressure in the abdomen.  We discussed that if this continues, she definitely needs to let her PCP know.  Component      Latest Ref Rng & Units 07/07/2020  TSH     0.35 - 4.50 uIU/mL 3.09  Vitamin  D, 25-Hydroxy     30.0 - 100.0 ng/mL 14.9 (L)  T4,Free(Direct)     0.60 - 1.60 ng/dL 1.07  Vitamin B12     211 - 911 pg/mL 511   Vitamin B12 and thyroid tests are normal. However, vitamin D is very low.  I would suggest to increase the dose to 4000 units vitamin D daily.  She will need a repeat vitamin D level in 2 months.    Philemon Kingdom, MD PhD [facility]

## 2020-07-07 NOTE — Patient Instructions (Addendum)
Please stop at the lab.  Continue Synthroid 100 mcg daily.  Take the thyroid hormone every day, with water, at least 30 minutes before breakfast, separated by at least 4 hours from: - acid reflux medications - calcium - iron - multivitamins  Please come back for a follow-up appointment in 1 year.  

## 2020-07-08 ENCOUNTER — Other Ambulatory Visit: Payer: Medicaid Other

## 2020-07-08 LAB — VITAMIN D 25 HYDROXY (VIT D DEFICIENCY, FRACTURES): Vit D, 25-Hydroxy: 14.9 ng/mL — ABNORMAL LOW (ref 30.0–100.0)

## 2020-07-21 ENCOUNTER — Ambulatory Visit: Payer: Medicaid Other | Admitting: Internal Medicine

## 2020-09-07 ENCOUNTER — Emergency Department (HOSPITAL_COMMUNITY)
Admission: EM | Admit: 2020-09-07 | Discharge: 2020-09-08 | Disposition: A | Discharge status: Home / Self Care | Payer: Medicaid Other | Attending: Emergency Medicine | Admitting: Emergency Medicine

## 2020-09-07 ENCOUNTER — Encounter (HOSPITAL_COMMUNITY): Payer: Self-pay

## 2020-09-07 ENCOUNTER — Other Ambulatory Visit: Payer: Self-pay

## 2020-09-07 DIAGNOSIS — I10 Essential (primary) hypertension: Secondary | ICD-10-CM | POA: Insufficient documentation

## 2020-09-07 DIAGNOSIS — R109 Unspecified abdominal pain: Secondary | ICD-10-CM | POA: Insufficient documentation

## 2020-09-07 DIAGNOSIS — Z79899 Other long term (current) drug therapy: Secondary | ICD-10-CM | POA: Diagnosis not present

## 2020-09-07 DIAGNOSIS — M62830 Muscle spasm of back: Secondary | ICD-10-CM | POA: Insufficient documentation

## 2020-09-07 DIAGNOSIS — Z853 Personal history of malignant neoplasm of breast: Secondary | ICD-10-CM | POA: Insufficient documentation

## 2020-09-07 NOTE — ED Triage Notes (Signed)
Patient arrived with complaints of pain on the lower left side of her back that started this morning. Reports some nausea. Declines any vomiting, diarrhea, or urinary problems. Took advil at 630pm

## 2020-09-08 ENCOUNTER — Encounter (HOSPITAL_COMMUNITY): Payer: Self-pay | Admitting: Emergency Medicine

## 2020-09-08 ENCOUNTER — Emergency Department (HOSPITAL_COMMUNITY): Payer: Medicaid Other

## 2020-09-08 LAB — COMPREHENSIVE METABOLIC PANEL
ALT: 17 U/L (ref 0–44)
AST: 18 U/L (ref 15–41)
Albumin: 4.7 g/dL (ref 3.5–5.0)
Alkaline Phosphatase: 64 U/L (ref 38–126)
Anion gap: 12 (ref 5–15)
BUN: 11 mg/dL (ref 6–20)
CO2: 21 mmol/L — ABNORMAL LOW (ref 22–32)
Calcium: 9.2 mg/dL (ref 8.9–10.3)
Chloride: 104 mmol/L (ref 98–111)
Creatinine, Ser: 0.82 mg/dL (ref 0.44–1.00)
GFR, Estimated: >60 mL/min (ref >60)
Glucose, Bld: 160 mg/dL — ABNORMAL HIGH (ref 70–99)
Potassium: 3.6 mmol/L (ref 3.5–5.1)
Sodium: 137 mmol/L (ref 135–145)
Total Bilirubin: 0.9 mg/dL (ref 0.3–1.2)
Total Protein: 8.6 g/dL — ABNORMAL HIGH (ref 6.5–8.1)

## 2020-09-08 LAB — URINALYSIS, ROUTINE W REFLEX MICROSCOPIC
Bilirubin Urine: NEGATIVE
Glucose, UA: NEGATIVE mg/dL
Ketones, ur: NEGATIVE mg/dL
Leukocytes,Ua: NEGATIVE
Nitrite: NEGATIVE
Protein, ur: NEGATIVE mg/dL
Specific Gravity, Urine: 1.018 (ref 1.005–1.030)
pH: 6 (ref 5.0–8.0)

## 2020-09-08 LAB — CBC
HCT: 41.1 % (ref 36.0–46.0)
Hemoglobin: 13.4 g/dL (ref 12.0–15.0)
MCH: 27 pg (ref 26.0–34.0)
MCHC: 32.6 g/dL (ref 30.0–36.0)
MCV: 82.9 fL (ref 80.0–100.0)
Platelets: 286 10*3/uL (ref 150–400)
RBC: 4.96 MIL/uL (ref 3.87–5.11)
RDW: 13.4 % (ref 11.5–15.5)
WBC: 8.1 10*3/uL (ref 4.0–10.5)
nRBC: 0 % (ref 0.0–0.2)

## 2020-09-08 LAB — LIPASE, BLOOD: Lipase: 28 U/L (ref 11–51)

## 2020-09-08 MED ORDER — METHOCARBAMOL 500 MG PO TABS: 500.0000 mg | ORAL_TABLET | Freq: Two times a day (BID) | ORAL | 0 refills | Status: DC

## 2020-09-08 MED ORDER — LIDOCAINE 5 % EX PTCH
1.0000 | MEDICATED_PATCH | CUTANEOUS | Status: DC
  Administered 2020-09-08: 1 via TRANSDERMAL
  Filled 2020-09-08: qty 1

## 2020-09-08 MED ORDER — ACETAMINOPHEN 500 MG PO TABS
1000.0000 mg | ORAL_TABLET | Freq: Once | ORAL | Status: AC
  Administered 2020-09-08: 1000 mg via ORAL
  Filled 2020-09-08 (×2): qty 2

## 2020-09-08 MED ORDER — LIDOCAINE 5 % EX PTCH: 1.0000 | MEDICATED_PATCH | CUTANEOUS | 0 refills | Status: DC

## 2020-09-08 MED ORDER — DEXAMETHASONE SODIUM PHOSPHATE 4 MG/ML IJ SOLN
4.0000 mg | Freq: Once | INTRAMUSCULAR | Status: AC
  Administered 2020-09-08: 4 mg via INTRAVENOUS
  Filled 2020-09-08: qty 1

## 2020-09-08 MED ORDER — KETOROLAC TROMETHAMINE 30 MG/ML IJ SOLN
15.0000 mg | Freq: Once | INTRAMUSCULAR | Status: AC
  Administered 2020-09-08: 15 mg via INTRAVENOUS
  Filled 2020-09-08: qty 1

## 2020-09-08 MED ORDER — METHOCARBAMOL 500 MG PO TABS
1000.0000 mg | ORAL_TABLET | ORAL | Status: AC
  Administered 2020-09-08: 1000 mg via ORAL
  Filled 2020-09-08: qty 2

## 2020-09-08 MED ORDER — METHOCARBAMOL 1000 MG/10ML IJ SOLN: 1000.0000 mg | Freq: Once | INTRAMUSCULAR | Status: DC

## 2020-09-08 MED ORDER — ONDANSETRON HCL 4 MG/2ML IJ SOLN
4.0000 mg | Freq: Once | INTRAMUSCULAR | Status: AC
  Administered 2020-09-08: 4 mg via INTRAVENOUS
  Filled 2020-09-08: qty 2

## 2020-09-08 MED ORDER — PREDNISONE 20 MG PO TABS: ORAL_TABLET | ORAL | 0 refills | Status: DC

## 2020-09-08 NOTE — ED Provider Notes (Addendum)
[name] DEPT Provider Note   CSN: [id number] Arrival date & time: 09/07/20  2242     History Chief Complaint  Patient presents with  . Back Pain    [name] is a 60 y.o. female.   Flank Pain This is a new problem. The current episode started 12 to 24 hours ago. The problem occurs constantly. The problem has not changed since onset.Pertinent negatives include no chest pain, no abdominal pain, no headaches and no shortness of breath. Nothing aggravates the symptoms. Nothing relieves the symptoms. She has tried nothing for the symptoms. The treatment provided no relief.  Left flank pain without radiation.  No f/c/r.  No urinary symptoms.  No n/v/d.       Past Medical History:  Diagnosis Date  . Acid reflux   . Cancer [facility]) 1995   breast cancer  . Essential hypertension 02/16/2015  . Hypertension   . Obesity 02/16/2015  . Post-operative nausea and vomiting   . Thyroid disease   . Vertigo     Patient Active Problem List   Diagnosis Date Noted  . Thyroid nodule 03/10/2018  . 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 Essential hypertension 02/16/2015  . Obesity 02/16/2015  . Acute sinus infection 06/09/2014  . Adenocarcinoma of left breast ([place]) 10/27/2013    Past Surgical History:  Procedure Laterality Date  . ABDOMINAL HYSTERECTOMY    . ESOPHAGEAL DILATION  2006?   Dr.Hayes  . 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 MASTECTOMY Left 2007  . MASTECTOMY       OB History    Gravida  2   Para  2   Term  2   Preterm      AB      Living  2     SAB      IAB      Ectopic      Multiple      Live Births              Family History  Problem Relation Age of Onset  . Asthma Mother   . Diabetes Father   . Breast cancer Paternal Aunt   . Stomach cancer Paternal Aunt   . Colon cancer Neg Hx   . Colon polyps Neg Hx   . Esophageal cancer Neg Hx   . Rectal cancer Neg Hx     Social History    Tobacco Use  . Smoking status: Never Smoker  . Smokeless tobacco: Never Used  . Tobacco comment: never used tobacco  Vaping Use  . Vaping Use: Never used  Substance Use Topics  . Alcohol use: No  . Drug use: No    Home Medications Prior to Admission medications   Medication Sig Start Date End Date Taking? Authorizing Provider  cholecalciferol (VITAMIN D) 1000 units tablet Take 1,000 Units by mouth daily.   Yes [provider]  lisinopril-hydrochlorothiazide (PRINZIDE,ZESTORETIC) 10-12.5 MG per tablet Take 1 tablet by mouth daily. 03/01/15  Yes Volanda Napoleon, MD  omeprazole (PRILOSEC) 20 MG capsule Take 1 capsule (20 mg total) by mouth daily. 09/09/15  Yes Volanda Napoleon, MD  PROVENTIL HFA [phone] Base) MCG/ACT inhaler Inhale 2 puffs into the lungs every 4 (four) hours as needed for wheezing or shortness of breath.  04/06/18  Yes [provider]  SYNTHROID 100 MCG tablet Take 1 tablet (100 mcg total) by mouth daily before breakfast. 03/23/20  Yes  Philemon Kingdom, MD    Allergies    Aspirin and Elemental sulfur  Review of Systems   Review of Systems  Constitutional: Negative for fever.  HENT: Negative for congestion.   Eyes: Negative for visual disturbance.  Respiratory: Negative for shortness of breath.   Cardiovascular: Negative for chest pain.  Gastrointestinal: Negative for abdominal pain.  Genitourinary: Positive for flank pain.  Musculoskeletal: Negative for arthralgias.  Neurological: Negative for headaches.  Psychiatric/Behavioral: Negative for agitation.  All other systems reviewed and are negative.   Physical Exam Updated Vital Signs BP (!) 175/92   Pulse 80   Temp 98.7 F (37.1 C) (Oral)   Resp 20   Ht 5\' 4"  (1.626 m)   Wt 98.4 kg   SpO2 98%   BMI 37.25 kg/m   Physical Exam Vitals and nursing note reviewed.  Constitutional:      General: She is not in acute distress.    Appearance: Normal appearance.  HENT:     Head:  Normocephalic and atraumatic.     Nose: Nose normal.  Eyes:     Conjunctiva/sclera: Conjunctivae normal.     Pupils: Pupils are equal, round, and reactive to light.  Cardiovascular:     Rate and Rhythm: Normal rate and regular rhythm.     Pulses: Normal pulses.     Heart sounds: Normal heart sounds.  Pulmonary:     Effort: Pulmonary effort is normal.     Breath sounds: Normal breath sounds.  Abdominal:     General: Abdomen is flat. Bowel sounds are normal.     Palpations: Abdomen is soft.     Tenderness: There is no abdominal tenderness. There is no guarding.  Musculoskeletal:        General: Normal range of motion.     Cervical back: Normal range of motion and neck supple.  Skin:    General: Skin is warm and dry.     Capillary Refill: Capillary refill takes less than 2 seconds.  Neurological:     General: No focal deficit present.     Mental Status: She is alert and oriented to person, place, and time.     Deep Tendon Reflexes: Reflexes normal.  Psychiatric:        Mood and Affect: Mood normal.        Behavior: Behavior normal.     ED Results / Procedures / Treatments   Labs (all labs ordered are listed, but only abnormal results are displayed) Results for orders placed or performed during the hospital encounter of 09/07/20  Lipase, blood  Result Value Ref Range   Lipase 28 11 - 51 U/L  Comprehensive metabolic panel  Result Value Ref Range   Sodium 137 135 - 145 mmol/L   Potassium 3.6 3.5 - 5.1 mmol/L   Chloride 104 98 - 111 mmol/L   CO2 21 (L) 22 - 32 mmol/L   Glucose, Bld 160 (H) 70 - 99 mg/dL   BUN 11 6 - 20 mg/dL   Creatinine, Ser 0.82 0.44 - 1.00 mg/dL   Calcium 9.2 8.9 - 10.3 mg/dL   Total Protein 8.6 (H) 6.5 - 8.1 g/dL   Albumin 4.7 3.5 - 5.0 g/dL   AST 18 15 - 41 U/L   ALT 17 0 - 44 U/L   Alkaline Phosphatase 64 38 - 126 U/L   Total Bilirubin 0.9 0.3 - 1.2 mg/dL   GFR, Estimated >60 >60 mL/min   Anion gap 12 5 - 15  CBC  Result Value Ref Range   WBC  8.1 4.0 - 10.5 K/uL   RBC 4.96 3.87 - 5.11 MIL/uL   Hemoglobin 13.4 12.0 - 15.0 g/dL   HCT 41.1 36.0 - 46.0 %   MCV 82.9 80.0 - 100.0 fL   MCH 27.0 26.0 - 34.0 pg   MCHC 32.6 30.0 - 36.0 g/dL   RDW 13.4 11.5 - 15.5 %   Platelets 286 150 - 400 K/uL   nRBC 0.0 0.0 - 0.2 %  Urinalysis, Routine w reflex microscopic  Result Value Ref Range   Color, Urine YELLOW YELLOW   APPearance CLEAR CLEAR   Specific Gravity, Urine 1.018 1.005 - 1.030   pH 6.0 5.0 - 8.0   Glucose, UA NEGATIVE NEGATIVE mg/dL   Hgb urine dipstick MODERATE (A) NEGATIVE   Bilirubin Urine NEGATIVE NEGATIVE   Ketones, ur NEGATIVE NEGATIVE mg/dL   Protein, ur NEGATIVE NEGATIVE mg/dL   Nitrite NEGATIVE NEGATIVE   Leukocytes,Ua NEGATIVE NEGATIVE   RBC / HPF 6-10 0 - 5 RBC/hpf   WBC, UA 0-5 0 - 5 WBC/hpf   Bacteria, UA RARE (A) NONE SEEN   Squamous Epithelial / LPF 0-5 0 - 5   Mucus PRESENT    CT Renal Stone Study  Result Date: 09/08/2020 CLINICAL DATA:  Left flank pain, nausea, microhematuria EXAM: CT ABDOMEN AND PELVIS WITHOUT CONTRAST TECHNIQUE: Multidetector CT imaging of the abdomen and pelvis was performed following the standard protocol without IV contrast. COMPARISON:  01/21/2007 FINDINGS: Lower chest: Left basilar parenchymal scarring and mild pleural thickening noted, likely postsurgical or posttraumatic in nature. The visualized lung bases are otherwise clear. The visualized heart and pericardium are unremarkable. Hepatobiliary: No focal liver abnormality is seen. No gallstones, gallbladder wall thickening, or biliary dilatation. Pancreas: Unremarkable Spleen: Unremarkable Adrenals/Urinary Tract: Adrenal glands are unremarkable. Kidneys are normal, without renal calculi, focal lesion, or hydronephrosis. Multiple phleboliths noted within the pelvis. Bladder is unremarkable. Stomach/Bowel: Stomach is within normal limits. Appendix appears normal. No evidence of bowel wall thickening, distention, or inflammatory changes.  No free intraperitoneal gas or fluid. Vascular/Lymphatic: Mild aortoiliac atherosclerotic calcification. No aortic aneurysm. No pathologic adenopathy within the abdomen and pelvis. Reproductive: Status post hysterectomy. No adnexal masses. Other: No abdominal wall hernia.  Rectum unremarkable. Musculoskeletal: No lytic or blastic bone lesions seen. No acute bone abnormality. IMPRESSION: No acute intra-abdominal pathology identified. No radiographic explanation for the patient's reported symptoms. Aortic Atherosclerosis (ICD10-I70.0). Electronically Signed   By: Fidela Salisbury MD   On: 09/08/2020 01:33    Radiology CT Renal Stone Study  Result Date: 09/08/2020 CLINICAL DATA:  Left flank pain, nausea, microhematuria EXAM: CT ABDOMEN AND PELVIS WITHOUT CONTRAST TECHNIQUE: Multidetector CT imaging of the abdomen and pelvis was performed following the standard protocol without IV contrast. COMPARISON:  01/21/2007 FINDINGS: Lower chest: Left basilar parenchymal scarring and mild pleural thickening noted, likely postsurgical or posttraumatic in nature. The visualized lung bases are otherwise clear. The visualized heart and pericardium are unremarkable. Hepatobiliary: No focal liver abnormality is seen. No gallstones, gallbladder wall thickening, or biliary dilatation. Pancreas: Unremarkable Spleen: Unremarkable Adrenals/Urinary Tract: Adrenal glands are unremarkable. Kidneys are normal, without renal calculi, focal lesion, or hydronephrosis. Multiple phleboliths noted within the pelvis. Bladder is unremarkable. Stomach/Bowel: Stomach is within normal limits. Appendix appears normal. No evidence of bowel wall thickening, distention, or inflammatory changes. No free intraperitoneal gas or fluid. Vascular/Lymphatic: Mild aortoiliac atherosclerotic calcification. No aortic aneurysm. No pathologic adenopathy within the abdomen and pelvis. Reproductive: Status post  hysterectomy. No adnexal masses. Other: No abdominal wall  hernia.  Rectum unremarkable. Musculoskeletal: No lytic or blastic bone lesions seen. No acute bone abnormality. IMPRESSION: No acute intra-abdominal pathology identified. No radiographic explanation for the patient's reported symptoms. Aortic Atherosclerosis (ICD10-I70.0). Electronically Signed   By: Fidela Salisbury MD   On: 09/08/2020 01:33    Procedures Procedures   Medications Ordered in ED Medications  acetaminophen (TYLENOL) tablet 1,000 mg (1,000 mg Oral Patient Refused/Not Given 09/08/20 0114)  lidocaine (LIDODERM) 5 % 1 patch (1 patch Transdermal Patch Applied 09/08/20 0253)  dexamethasone (DECADRON) injection 4 mg (has no administration in time range)  methocarbamol (ROBAXIN) injection 1,000 mg (has no administration in time range)  ketorolac (TORADOL) 30 MG/ML injection 15 mg (15 mg Intravenous Given 09/08/20 0113)  ondansetron (ZOFRAN) injection 4 mg (4 mg Intravenous Given 09/08/20 0114)    ED Course  I have reviewed the triage vital signs and the nursing notes.  Pertinent labs & imaging results that were available during my care of the patient were reviewed by me and considered in my medical decision making (see chart for details).   No stones or acute abdominal pathology.  This is a back spasm.  Will treat for same.  With Lidoderm and NSAIDS and muscle relaxer and steroids.    [name] was evaluated in Emergency Department on 09/08/2020 for the symptoms described in the history of present illness. She was evaluated in the context of the global COVID-19 pandemic, which necessitated consideration that the patient might be at risk for infection with the SARS-CoV-2 virus that causes COVID-19. Institutional protocols and algorithms that pertain to the evaluation of patients at risk for COVID-19 are in a state of rapid change based on information released by regulatory bodies including the CDC and federal and state organizations. These policies and algorithms were followed during  the patient's care in the ED.  Final Clinical Impression(s) / ED Diagnoses Return for intractable cough, coughing up blood, fevers >100.4 unrelieved by medication, shortness of breath, intractable vomiting, chest pain, shortness of breath, weakness, numbness, changes in speech, facial asymmetry, abdominal pain, passing out, Inability to tolerate liquids or food, cough, altered mental status or any concerns. No signs of systemic illness or infection. The patient is nontoxic-appearing on exam and vital signs are within normal limits.  I have reviewed the triage vital signs and the nursing notes. Pertinent labs & imaging results that were available during my care of the patient were reviewed by me and considered in my medical decision making (see chart for details). After history, exam, and medical workup I feel the patient has been appropriately medically screened and is safe for discharge home. Pertinent diagnoses were discussed with the patient. Patient was given return precautions.    Kieren Ricci, MD 09/08/20 [place], Santana Gosdin, MD 09/08/20 0300

## 2020-09-14 ENCOUNTER — Other Ambulatory Visit: Payer: Self-pay

## 2020-09-26 ENCOUNTER — Other Ambulatory Visit: Payer: Self-pay | Admitting: Hematology & Oncology

## 2020-09-26 DIAGNOSIS — Z1231 Encounter for screening mammogram for malignant neoplasm of breast: Secondary | ICD-10-CM

## 2020-11-16 ENCOUNTER — Ambulatory Visit: Payer: Medicaid Other

## 2020-11-30 ENCOUNTER — Ambulatory Visit (HOSPITAL_COMMUNITY)
Admission: EM | Admit: 2020-11-30 | Discharge: 2020-11-30 | Disposition: A | Discharge status: Home / Self Care | Payer: Medicaid Other | Attending: Student | Admitting: Student

## 2020-11-30 ENCOUNTER — Other Ambulatory Visit: Payer: Self-pay

## 2020-11-30 ENCOUNTER — Encounter (HOSPITAL_COMMUNITY): Payer: Self-pay | Admitting: Emergency Medicine

## 2020-11-30 DIAGNOSIS — J301 Allergic rhinitis due to pollen: Secondary | ICD-10-CM | POA: Diagnosis not present

## 2020-11-30 DIAGNOSIS — K222 Esophageal obstruction: Secondary | ICD-10-CM | POA: Diagnosis not present

## 2020-11-30 DIAGNOSIS — S1983XS Other specified injuries of vocal cord, sequela: Secondary | ICD-10-CM | POA: Diagnosis not present

## 2020-11-30 DIAGNOSIS — H6593 Unspecified nonsuppurative otitis media, bilateral: Secondary | ICD-10-CM | POA: Diagnosis not present

## 2020-11-30 MED ORDER — CETIRIZINE HCL 10 MG PO TABS
10.0000 mg | ORAL_TABLET | Freq: Every day | ORAL | 2 refills | Status: DC
Start: 2020-11-30 — End: 2021-07-05

## 2020-11-30 MED ORDER — PREDNISONE 20 MG PO TABS: 40.0000 mg | ORAL_TABLET | Freq: Every day | ORAL | 0 refills | Status: AC

## 2020-11-30 NOTE — Discharge Instructions (Addendum)
-  Prednisone, 2 pills taken at the same time for 5 days in a row.  Try taking this earlier in the day as it can give you energy. Avoid ibuprofen while taking this medication. -Also try zyrtec daily x1 week, continue for longer if it's helping -If you develop new symptoms like shortness of breath, trouble swallowing, trouble eating, one side of your throat looks a lot bigger than the other-head straight to the emergency department.

## 2020-11-30 NOTE — ED Provider Notes (Signed)
[place]    CSN: [id number] Arrival date & time: 11/30/20  0857      History   Chief Complaint Chief Complaint  Patient presents with   Ear Fullness    HPI [name] is a 60 y.o. female presenting with bilateral ear pressure right worse than left and concerned that her right tonsil looks bigger than the left.  Medical history esophageal stricture, postoperative vocal cord injury per patient, hypertension, obesity, thyroid disease, breast cancer, GERD.  Patient states that she woke up this morning with bilateral ear pressure right worse than left with some right-sided neck pain.  States she looked at the back of her throat and is concerned that her right tonsil looks bigger than the other. Denies sore throat. Denies trouble swallowing, shortness of breath, trouble handling secretions, voice change, fever/chills, dizziness, chest pain.  Occasional nonproductive cough.  Has not tried any medications for her symptoms.  Does not take any medications for allergic rhinitis.  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   Dr.Hayes   MASS EXCISION  1995   thymus gland & LEFT VOCAL CORD   MASTECTOMY Left 2007   MASTECTOMY      OB History     Gravida  2   Para  2   Term  2   Preterm      AB      Living  2      SAB      IAB      Ectopic      Multiple      Live Births                Home Medications    Prior to Admission medications   Medication Sig Start Date End Date Taking? Authorizing Provider  cetirizine (ZYRTEC) 10 MG tablet Take 1 tablet (10 mg total) by mouth daily. 11/30/20  Yes Hazel Sams, PA-C  lisinopril-hydrochlorothiazide (PRINZIDE,ZESTORETIC) 10-12.5 MG per tablet Take 1 tablet by mouth daily. 03/01/15  Yes Volanda Napoleon, MD  predniSONE (DELTASONE) 20 MG tablet Take 2 tablets (40 mg total) by mouth daily for 5 days. 11/30/20 12/05/20 Yes Hazel Sams, PA-C  SYNTHROID 100 MCG tablet Take 1 tablet (100 mcg total) by mouth daily before breakfast. 03/23/20  Yes Philemon Kingdom, MD  cholecalciferol (VITAMIN D) 1000 units tablet Take 1,000 Units by mouth daily.    [provider]  lidocaine (LIDODERM) 5 % Place 1 patch onto the skin daily. Remove & Discard patch within 12 hours or as directed by MD Patient not taking: Reported on 11/30/2020 09/08/20   Randal Buba, April, MD  methocarbamol (ROBAXIN) 500 MG tablet Take 1 tablet (500 mg total) by mouth 2 (two) times daily. Patient not taking: Reported on 11/30/2020 09/08/20   Palumbo, April, MD  omeprazole (PRILOSEC) 20 MG capsule  Take 1 capsule (20 mg total) by mouth daily. 09/09/15   Volanda Napoleon, MD  PROVENTIL HFA 108 [phone] Base) MCG/ACT inhaler Inhale 2 puffs into the lungs every 4 (four) hours as needed for wheezing or shortness of breath.  04/06/18   [provider]    Family History Family History  Problem Relation Age of Onset   Asthma Mother    Diabetes Father    Breast cancer Paternal Aunt    Stomach cancer Paternal Aunt    Colon cancer Neg Hx    Colon polyps Neg Hx    Esophageal cancer Neg Hx    Rectal cancer Neg Hx     Social History Social History   Tobacco Use   Smoking status: Never   Smokeless tobacco: Never   Tobacco comments:    never used tobacco  Vaping Use   Vaping Use: Never used  Substance Use Topics   Alcohol use: No   Drug use: No      Allergies   Aspirin and Elemental sulfur   Review of Systems Review of Systems  Constitutional:  Negative for appetite change, chills and fever.  HENT:  Positive for congestion. Negative for ear discharge, ear pain, rhinorrhea, sinus pressure, sinus pain and sore throat.        R cervical lymph node pain  Eyes:  Negative for redness and visual disturbance.  Respiratory:  Negative for cough, chest tightness, shortness of breath and wheezing.   Cardiovascular:  Negative for chest pain and palpitations.  Gastrointestinal:  Negative for abdominal pain, constipation, diarrhea, nausea and vomiting.  Genitourinary:  Negative for dysuria, frequency and urgency.  Musculoskeletal:  Negative for myalgias.  Neurological:  Negative for dizziness, weakness and headaches.  Psychiatric/Behavioral:  Negative for confusion.   All other systems reviewed and are negative.   Physical Exam Triage Vital Signs ED Triage Vitals  Enc Vitals Group     BP 11/30/20 0924 (!) 149/88     Pulse Rate 11/30/20 0924 80     Resp 11/30/20 0924 18     Temp 11/30/20 0924 99.1 F (37.3 C)     Temp Source 11/30/20 0924 Oral     SpO2 11/30/20 0924 100 %     Weight --      Height --      Head Circumference --      Peak Flow --      Pain Score 11/30/20 0921 0     Pain Loc --      Pain Edu? --      Excl. in [place]? --    No data found.  Updated Vital Signs BP (!) 149/88 (BP Location: Right Arm)   Pulse 80   Temp 99.1 F (37.3 C) (Oral)   Resp 18   SpO2 100%   Visual Acuity Right Eye Distance:   Left Eye Distance:   Bilateral Distance:    Right Eye Near:   Left Eye Near:    Bilateral Near:     Physical Exam Vitals reviewed.  Constitutional:      General: She is not in acute distress.    Appearance: Normal appearance. She is not ill-appearing.  HENT:     Head: Normocephalic and atraumatic.     Right Ear: Hearing, ear canal and external ear normal. No swelling or tenderness. A middle ear  effusion is present. There is no impacted cerumen. No mastoid tenderness. Tympanic membrane is not perforated, erythematous, retracted or bulging.  Left Ear: Hearing, tympanic membrane, ear canal and external ear normal. No swelling or tenderness. There is no impacted cerumen. No mastoid tenderness. Tympanic membrane is not perforated, erythematous, retracted or bulging.     Nose:     Right Sinus: No maxillary sinus tenderness or frontal sinus tenderness.     Left Sinus: No maxillary sinus tenderness or frontal sinus tenderness.     Mouth/Throat:     Mouth: Mucous membranes are moist.     Pharynx: Uvula midline. Posterior oropharyngeal erythema present. No oropharyngeal exudate.     Tonsils: No tonsillar exudate. 1+ on the right. 1+ on the left.     Comments: Smooth erythema posterior pharynx Tonsils are barely visible, and they are symmetric. On exam, uvula is midline, she is tolerating her secretions without difficulty, there is no trismus, no drooling, she has normal phonation  Eyes:     Extraocular Movements: Extraocular movements intact.     Pupils: Pupils are equal, round, and reactive to light.  Cardiovascular:     Rate and Rhythm: Normal rate and regular rhythm.     Heart sounds: Normal heart sounds.  Pulmonary:     Breath sounds: Normal breath sounds and air entry. No wheezing, rhonchi or rales.  Chest:     Chest wall: No tenderness.  Abdominal:     General: Abdomen is flat. Bowel sounds are normal.     Tenderness: There is no abdominal tenderness. There is no guarding or rebound.  Lymphadenopathy:     Cervical: No cervical adenopathy.  Neurological:     General: No focal deficit present.     Mental Status: She is alert and oriented to person, place, and time.  Psychiatric:        Attention and Perception: Attention and perception normal.        Mood and Affect: Mood and affect normal.        Behavior: Behavior normal. Behavior is cooperative.        Thought Content:  Thought content normal.        Judgment: Judgment normal.     UC Treatments / Results  Labs (all labs ordered are listed, but only abnormal results are displayed) Labs Reviewed - No data to display  EKG   Radiology No results found.  Procedures Procedures (including critical care time)  Medications Ordered in UC Medications - No data to display  Initial Impression / Assessment and Plan / UC Course  I have reviewed the triage vital signs and the nursing notes.  Pertinent labs & imaging results that were available during my care of the patient were reviewed by me and considered in my medical decision making (see chart for details).     This patient is a 60 year old female presenting with untreated allergic rhinitis. Benign exam, no posterior pharyngeal swelling. Centor score 0, rapid strep deferred. Trial of zyrtec and prednisone. She is not a diabetic. ED return precautions discussed. Patient verbalizes understanding and agreement.  Continue to follow with specialist for esophageal stricture and h/o vocal cord damage, no changes in this today.  Final Clinical Impressions(s) / UC Diagnoses   Final diagnoses:  Fluid level behind tympanic membrane of both ears  Esophageal stricture  Other specified injuries of vocal cord, sequela  Seasonal allergic rhinitis due to pollen     Discharge Instructions      -Prednisone, 2 pills taken at the same time for 5 days in a row.  Try taking this earlier in the day as  it can give you energy. Avoid ibuprofen while taking this medication. -Also try zyrtec daily x1 week, continue for longer if it's helping -If you develop new symptoms like shortness of breath, trouble swallowing, trouble eating, one side of your throat looks a lot bigger than the other-head straight to the emergency department.   ED Prescriptions     Medication Sig Dispense Auth. Provider   predniSONE (DELTASONE) 20 MG tablet Take 2 tablets (40 mg total) by mouth  daily for 5 days. 10 tablet Hazel Sams, PA-C   cetirizine (ZYRTEC) 10 MG tablet Take 1 tablet (10 mg total) by mouth daily. 30 tablet Hazel Sams, PA-C      PDMP not reviewed this encounter.   Hazel Sams, PA-C 11/30/20 [phone]

## 2020-11-30 NOTE — ED Triage Notes (Addendum)
Woke this morning with right side of throat swollen and redness, minimal cough this morning.  Right ear fullness.  This has happened before.   Patient has appts with specialists, but is concerned tonsil interfering with airway.  Denies sob currently.

## 2020-12-06 ENCOUNTER — Emergency Department (HOSPITAL_COMMUNITY): Payer: Medicaid Other

## 2020-12-06 ENCOUNTER — Encounter (HOSPITAL_COMMUNITY): Payer: Self-pay

## 2020-12-06 ENCOUNTER — Emergency Department (HOSPITAL_COMMUNITY)
Admission: EM | Admit: 2020-12-06 | Discharge: 2020-12-06 | Disposition: A | Discharge status: Home / Self Care | Payer: Medicaid Other | Attending: Emergency Medicine | Admitting: Emergency Medicine

## 2020-12-06 DIAGNOSIS — Z853 Personal history of malignant neoplasm of breast: Secondary | ICD-10-CM | POA: Diagnosis not present

## 2020-12-06 DIAGNOSIS — I10 Essential (primary) hypertension: Secondary | ICD-10-CM | POA: Diagnosis not present

## 2020-12-06 DIAGNOSIS — Z79899 Other long term (current) drug therapy: Secondary | ICD-10-CM | POA: Insufficient documentation

## 2020-12-06 DIAGNOSIS — R1310 Dysphagia, unspecified: Secondary | ICD-10-CM | POA: Diagnosis present

## 2020-12-06 DIAGNOSIS — E890 Postprocedural hypothyroidism: Secondary | ICD-10-CM | POA: Diagnosis not present

## 2020-12-06 MED ORDER — FAMOTIDINE 20 MG PO TABS: 20.0000 mg | ORAL_TABLET | Freq: Two times a day (BID) | ORAL | 0 refills | Status: AC

## 2020-12-06 MED ORDER — LIDOCAINE VISCOUS HCL 2 % MT SOLN
15.0000 mL | Freq: Once | OROMUCOSAL | Status: AC
  Administered 2020-12-06: 15 mL via ORAL
  Filled 2020-12-06: qty 15

## 2020-12-06 MED ORDER — ALUM & MAG HYDROXIDE-SIMETH 200-200-20 MG/5ML PO SUSP
30.0000 mL | Freq: Once | ORAL | Status: AC
  Administered 2020-12-06: 30 mL via ORAL
  Filled 2020-12-06: qty 30

## 2020-12-06 NOTE — ED Triage Notes (Signed)
Pt c/o feeling like something is stuck in her throat and R side throat swelling x 1 week.  Pain score 7/10.  Raspy voice noted.    Pt has been seen previously for same and prescribed prednisone w/o relief.     Pt has an ENT appointment x a couple weeks.

## 2020-12-06 NOTE — ED Notes (Signed)
Pt transported to xray 

## 2020-12-06 NOTE — ED Provider Notes (Signed)
Emergency Medicine Provider Triage Evaluation Note  [name] , a 60 y.o. female  was evaluated in triage.  Pt complains of difficulty swallowing.  Patient states that for the last week and a half she has felt like it is more difficult for her to swallow.  Feels like there is a possible foreign body in her right foot.  She states that she has still been able to eat and drink but does have this odd feeling of foreign body sensation.  No nausea or vomiting.  She does state that she has a history of a "narrowing of the throat", and endoscopy done many years ago but does not follow with GI currently.  Chart review, she also has a history of a thyroid nodule.  Review of Systems  Positive: As above Negative: As above  Physical Exam  BP (!) 148/90 (BP Location: Right Arm)   Pulse 86   Temp 98.3 F (36.8 C) (Oral)   Resp 14   Ht 5\' 4"  (1.626 m)   Wt 97.1 kg   SpO2 100%   BMI 36.73 kg/m  Gen:   Awake, no distress   Resp:  Normal effort  MSK:   Moves extremities without difficulty  Other:  Patient tolerating secretions well, with hoarse voice which patient states is not abnormal for her.  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  Oropharynx patent, uvula midline, no tripoding  Medical Decision Making  Medically screening exam initiated at 6:40 PM.  Appropriate orders placed.  BRICIA TAHER was informed that the remainder of the evaluation will be completed by another provider, this initial triage assessment does not replace that evaluation, and the importance of remaining in the ED until their evaluation is complete.     Garald Balding, PA-C 12/06/20 1842    Valarie Merino, MD 12/07/20 [phone]

## 2020-12-06 NOTE — ED Provider Notes (Signed)
[name] DEPT Provider Note   CSN: [id number] Arrival date & time: 12/06/20  1607     History Chief Complaint  Patient presents with   Sore Throat    [name] is a 60 y.o. female.  60 year old female with prior medical history as detailed below presents for evaluation.  Patient complains of persistent mild dysphagia.  She complains that the symptoms have been ongoing for the last several weeks.  She has an appointment with her GI provider later next month.  She reports prior history of esophageal stricture and dilatation.  She denies difficulty with phonation.  She denies fever.  She denies significant difficulty swallowing her own secretions.  The history is provided by the patient and medical records.  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   Dr.Hayes   MASS EXCISION  1995   thymus gland & LEFT VOCAL CORD   MASTECTOMY Left 2007   MASTECTOMY       OB History     Gravida  2   Para  2   Term  2   Preterm      AB      Living  2      SAB      IAB      Ectopic      Multiple      Live Births              Family History  Problem Relation Age of Onset   Asthma Mother     Diabetes Father    Breast cancer Paternal Aunt    Stomach cancer Paternal Aunt    Colon cancer Neg Hx    Colon polyps Neg Hx    Esophageal cancer Neg Hx    Rectal cancer Neg Hx     Social History   Tobacco Use   Smoking status: Never   Smokeless tobacco: Never   Tobacco comments:    never used tobacco  Vaping Use   Vaping Use: Never used  Substance Use Topics   Alcohol use: No   Drug use: No    Home Medications Prior to Admission medications   Medication Sig Start Date End Date Taking? Authorizing Provider  famotidine (PEPCID) 20 MG tablet Take 1 tablet (20 mg total) by mouth 2 (two) times daily. 12/06/20  Yes Valarie Merino, MD  cetirizine (ZYRTEC) 10 MG tablet Take 1 tablet (10 mg total) by mouth daily. 11/30/20   Hazel Sams, PA-C  cholecalciferol (VITAMIN D) 1000 units tablet Take 1,000 Units by mouth daily.    [provider]  lidocaine (LIDODERM) 5 % Place 1 patch onto the skin daily. Remove &  Discard patch within 12 hours or as directed by MD Patient not taking: Reported on 11/30/2020 09/08/20   Palumbo, April, MD  lisinopril-hydrochlorothiazide (PRINZIDE,ZESTORETIC) 10-12.5 MG per tablet Take 1 tablet by mouth daily. 03/01/15   Volanda Napoleon, MD  methocarbamol (ROBAXIN) 500 MG tablet Take 1 tablet (500 mg total) by mouth 2 (two) times daily. Patient not taking: Reported on 11/30/2020 09/08/20   Palumbo, April, MD  omeprazole (PRILOSEC) 20 MG capsule Take 1 capsule (20 mg total) by mouth daily. 09/09/15   Volanda Napoleon, MD  PROVENTIL HFA 108 [phone] Base) MCG/ACT inhaler Inhale 2 puffs into the lungs every 4 (four) hours as needed for wheezing or shortness of breath.  04/06/18   [provider]  SYNTHROID 100 MCG tablet Take 1 tablet (100 mcg total) by mouth daily before breakfast. 03/23/20   Philemon Kingdom, MD    Allergies    Aspirin and Elemental sulfur  Review of Systems   Review of Systems  All other systems reviewed and are  negative.  Physical Exam Updated Vital Signs BP (!) 189/93   Pulse 63   Temp 98.3 F (36.8 C) (Oral)   Resp 17   Ht 5\' 4"  (1.626 m)   Wt 97.1 kg   SpO2 100%   BMI 36.73 kg/m   Physical Exam Vitals and nursing note reviewed.  Constitutional:      General: She is not in acute distress.    Appearance: Normal appearance. She is well-developed.  HENT:     Head: Normocephalic and atraumatic.  Eyes:     Conjunctiva/sclera: Conjunctivae normal.     Pupils: Pupils are equal, round, and reactive to light.  Cardiovascular:     Rate and Rhythm: Normal rate and regular rhythm.     Heart sounds: Normal heart sounds.  Pulmonary:     Effort: Pulmonary effort is normal. No respiratory distress.     Breath sounds: Normal breath sounds.  Abdominal:     General: There is no distension.     Palpations: Abdomen is soft.     Tenderness: There is no abdominal tenderness.  Musculoskeletal:        General: No deformity. Normal range of motion.     Cervical back: Normal range of motion and neck supple.  Skin:    General: Skin is warm and dry.  Neurological:     General: No focal deficit present.     Mental Status: She is alert and oriented to person, place, and time.    ED Results / Procedures / Treatments   Labs (all labs ordered are listed, but only abnormal results are displayed) Labs Reviewed - No data to display  EKG None  Radiology DG Neck Soft Tissue  Result Date: 12/06/2020 CLINICAL DATA:  60 year old female with foreign body sensation in throat. EXAM: NECK SOFT TISSUES - 1+ VIEW COMPARISON:  Neck radiograph dated 10/03/2013. FINDINGS: There is no evidence of retropharyngeal soft tissue swelling or epiglottic enlargement. The cervical airway is unremarkable and no radio-opaque foreign body identified. Surgical clips in the left upper chest. IMPRESSION: Negative. Electronically Signed   By: Anner Crete M.D.   On: 12/06/2020 19:38   DG Chest 2 View  Result Date:  12/06/2020 CLINICAL DATA:  Foreign body sensation in throat EXAM: CHEST - 2 VIEW COMPARISON:  None. FINDINGS: Heart is normal size. Scarring in the lingula. Right lung clear. No effusions. No acute bony abnormality. No visible radiopaque foreign body. IMPRESSION: No active cardiopulmonary disease. Electronically  Signed   By: Rolm Baptise M.D.   On: 12/06/2020 19:44    Procedures Procedures   Medications Ordered in ED Medications - No data to display  ED Course  I have reviewed the triage vital signs and the nursing notes.  Pertinent labs & imaging results that were available during my care of the patient were reviewed by me and considered in my medical decision making (see chart for details).    MDM Rules/Calculators/A&P                          MDM  MSE complete  JEYLIN WOODMANSEE was evaluated in Emergency Department on 12/06/2020 for the symptoms described in the history of present illness. She was evaluated in the context of the global COVID-19 pandemic, which necessitated consideration that the patient might be at risk for infection with the SARS-CoV-2 virus that causes COVID-19. Institutional protocols and algorithms that pertain to the evaluation of patients at risk for COVID-19 are in a state of rapid change based on information released by regulatory bodies including the CDC and federal and state organizations. These policies and algorithms were followed during the patient's care in the ED.  Patient is presenting with complaint of mild chronic dysphagia  Symptoms are perhaps related to prior history of esophageal dilatation status post stricture.  Patient has already established close follow-up with her GI provider next month.  Evaluation today is not suggestive of need for acute endoscopy / intervention or other procedure.  Importance of close follow-up is stressed.  Strict return precautions given and understood. Final Clinical Impression(s) / ED Diagnoses Final diagnoses:   Dysphagia, unspecified type    Rx / DC Orders ED Discharge Orders          Ordered    famotidine (PEPCID) 20 MG tablet  2 times daily        12/06/20 2114             Valarie Merino, MD 12/06/20 2118

## 2020-12-06 NOTE — Discharge Instructions (Addendum)
Return for any problem.  ?

## 2020-12-06 NOTE — ED Notes (Signed)
Pt verbalized understanding of d/c, medication, and follow up care. Ambulatory with steady gait.  

## 2020-12-28 ENCOUNTER — Telehealth: Payer: Self-pay | Admitting: Internal Medicine

## 2020-12-28 NOTE — Telephone Encounter (Signed)
Pt called wanting to speak to Dr regarding a tightness in her throat. She states she has been unable to get her lab work done but may need to come in to get her thyroid checked but just wanted to speak w Dr regarding what is going on.  [phone]

## 2020-12-29 ENCOUNTER — Other Ambulatory Visit: Payer: Self-pay | Admitting: Internal Medicine

## 2020-12-29 DIAGNOSIS — E041 Nontoxic single thyroid nodule: Secondary | ICD-10-CM

## 2020-12-29 NOTE — Telephone Encounter (Signed)
Please advise 

## 2020-12-29 NOTE — Telephone Encounter (Signed)
Called spoke with patient advised that the order was placed for [place] and that Dr Darnell Level will not be in the office when she has this done ,however  one of the providers will be able give her the results. Also that she can have this done at [facility]. Pt understood and stated that she will have this done.

## 2020-12-29 NOTE — Telephone Encounter (Signed)
T, I ordered a thyroid ultrasound for her.  I will not be in the office to give her the results until mid August, but she may call to get the results from the doctor on-call.  I ordered it in [facility], downstairs from my office.

## 2021-01-10 ENCOUNTER — Other Ambulatory Visit: Payer: Self-pay

## 2021-01-10 ENCOUNTER — Ambulatory Visit
Admission: RE | Admit: 2021-01-10 | Discharge: 2021-01-10 | Disposition: A | Discharge status: Home / Self Care | Payer: Medicaid Other | Source: Ambulatory Visit | Attending: Hematology & Oncology | Admitting: Hematology & Oncology

## 2021-01-10 DIAGNOSIS — Z1231 Encounter for screening mammogram for malignant neoplasm of breast: Secondary | ICD-10-CM

## 2021-01-12 ENCOUNTER — Other Ambulatory Visit: Payer: Medicaid Other

## 2021-01-25 ENCOUNTER — Ambulatory Visit
Admission: RE | Admit: 2021-01-25 | Discharge: 2021-01-25 | Disposition: A | Discharge status: Home / Self Care | Payer: Medicaid Other | Source: Ambulatory Visit | Attending: Internal Medicine | Admitting: Internal Medicine

## 2021-01-25 DIAGNOSIS — E041 Nontoxic single thyroid nodule: Secondary | ICD-10-CM

## 2021-02-06 ENCOUNTER — Encounter: Payer: Medicaid Other | Admitting: Adult Health

## 2021-02-27 ENCOUNTER — Encounter: Payer: Self-pay | Admitting: Adult Health

## 2021-02-27 ENCOUNTER — Other Ambulatory Visit: Payer: Self-pay

## 2021-02-27 ENCOUNTER — Ambulatory Visit (INDEPENDENT_AMBULATORY_CARE_PROVIDER_SITE_OTHER): Payer: Medicaid Other | Admitting: Adult Health

## 2021-02-27 VITALS — BP 160/93 | HR 84 | Ht 64.0 in | Wt 203.0 lb

## 2021-02-27 DIAGNOSIS — N9089 Other specified noninflammatory disorders of vulva and perineum: Secondary | ICD-10-CM | POA: Insufficient documentation

## 2021-02-27 MED ORDER — CLOBETASOL PROPIONATE 0.05 % EX CREA: 1.0000 "application " | TOPICAL_CREAM | Freq: Two times a day (BID) | CUTANEOUS | 1 refills | Status: DC

## 2021-02-27 NOTE — Progress Notes (Signed)
  Subjective:     Patient ID: [name], female   DOB: 1960-10-20, 60 y.o.   MRN: [id number]  HPI [name] is a 60 year old black female, single, sp hysterectomy, in for area right labia. PCP is Dr Quillian Quince.  Review of Systems Had shaved then noticed area right labia, it will get bigger then smaller she thinks, no pain  Reviewed past medical,surgical, social and family history. Reviewed medications and allergies.     Objective:   Physical Exam BP (!) 160/93 (BP Location: Left Arm, Patient Position: Sitting, Cuff Size: Normal)   Pulse 84   Ht 5\' 4"  (1.626 m)   Wt 203 lb (92.1 kg)   BMI 34.84 kg/m     Skin warm and dry.Pelvic: external genitalia is normal in appearance,there is 4 mm, round,think, white papule right labia, vagina: pink,urethra has no lesions or masses noted,  AA is 0 Fall risk is low Depression screen [facility] 2/9 02/27/2021 09/01/2013  Decreased Interest 0 0  Down, Depressed, Hopeless 0 0  PHQ - 2 Score 0 0  Altered sleeping 0 -  Tired, decreased energy 0 -  Change in appetite 0 -  Feeling bad or failure about yourself  0 -  Trouble concentrating 0 -  Moving slowly or fidgety/restless 0 -  Suicidal thoughts 0 -  PHQ-9 Score 0 -    GAD 7 : Generalized Anxiety Score 02/27/2021  Nervous, Anxious, on Edge 0  Control/stop worrying 0  Worry too much - different things 0  Trouble relaxing 0  Restless 0  Easily annoyed or irritable 0  Afraid - awful might happen 0  Total GAD 7 Score 0       Up[place] - 02/27/21 1115       Pregnancy Intention Screening   Does the patient want to become pregnant in the next year? N/A    Does the patient's partner want to become pregnant in the next year? N/A    Would the patient like to discuss contraceptive options today? N/A      Contraception Wrap Up   Current Method Female Sterilization   hysterectomy   End Method Female Sterilization   hyst   Contraception Counseling Provided No           .Examination chaperoned by  [employer].  Assessment:     1. Labial lesion Will try temovate cream Meds ordered this encounter  Medications   clobetasol cream (TEMOVATE) 0.05 %    Sig: Apply 1 application topically 2 (two) times daily.    Dispense:  45 g    Refill:  1    Order Specific Question:   Supervising Provider    Answer:   Florian Buff [[id number]]       Plan:     Follow up with me in about 6 weeks or sooner if needed

## 2021-03-28 ENCOUNTER — Ambulatory Visit: Payer: Medicaid Other | Admitting: Internal Medicine

## 2021-03-28 NOTE — Progress Notes (Deleted)
Patient ID: [name], female   DOB: [birth year], 60 y.o.   MRN: [id number]   This visit occurred during the SARS-CoV-2 public health emergency.  Safety protocols were in place, including screening questions prior to the visit, additional usage of staff PPE, and extensive cleaning of exam room while observing appropriate contact time as indicated for disinfecting solutions.   HPI  [name] is a 60 y.o.-year-old female, initially referred by her PCP, Dr. 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  Last visit 1 year ago.  Interim history:  Left thyroid nodule -Diagnosed in 2015  Reviewed prev. Investigations: FNA (09/15/2013): AUS FNA (01/14/2014): FLUS Thyroid U/S (10/04/2017): Complex left lobe nodule 1 measures 4.9 x 3.9 x 3.9 cm and previously measured 3.6 x 2.9 x 3.0 cm. Biopsy has been performed previously. At last visit, I referred her for alcohol instillation in the cyst (Dr. Kathlene Cooley): FNA + EtOH instillation (03/31/2018):  Scant ep. cells  Thyroid U/S (02/04/2021): Parenchymal Echotexture: Mildly heterogenous Isthmus: 0.2 cm Right lobe: 2.0 x 0.8 x 0.8 cm Left lobe: 4.9 x 3.4 x 3.4 cm _________________________________________________________   Again seen is a large solid and cystic nodule involving almost the entirety of the left thyroid lobe measuring 4.9 x 3.4 x 3.4 cm, previously 4.9 x 4.0 x 3.9 cm. This nodule was previously biopsied in 2015.   IMPRESSION: Stable appearance of a large left thyroid nodule measuring up to 4.9 cm, essentially unchanged since 2019. This nodule was previously biopsied in 2015, correlate with biopsy results.  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  Hypothyroidism: -postablation -not well controlled.  In the last 1.5 years, we have been decreasing  the dose of levothyroxine from 175 to 112 mcg daily. However, at last visit, she was given 125 mcg from the pharmacy. We then decreased back to 112 mcg daily, then to 100 mcg daily.  She takes Synthroid DAW -as she had choking with the generic levothyroxine in the past (although, upon questioning, she is not sure whether she is given the brand name from the pharmacy) -choking improved. - in am - fasting - at least 30 min from b'fast - no calcium - no iron - no multivitamins - + PPIs >4h after LT4 - not on Biotin  Reviewed her TFTs: Lab Results  Component Value Date   TSH 3.09 07/07/2020   TSH 3.39 03/22/2020   TSH 0.11 (L) 03/20/2019   TSH 0.112 (L) 09/06/2018   TSH 0.13 (L) 04/28/2018   TSH <0.01 Repeated and verified X2. (L) 03/10/2018   TSH 0.088 (L) 01/01/2018   TSH 42.838 (H) 07/03/2017   FREET4 1.07 07/07/2020   FREET4 0.95 03/22/2020   FREET4 1.45 03/20/2019   FREET4 1.21 04/28/2018   FREET4 1.58 03/10/2018    She continues to have hot flushes.  She has a family history of thyroid disease in her sister. No FH of thyroid cancer. No h/o radiation tx to head or neck.  No herbal supplements. No Biotin use. No recent steroids use.   She has a history of breast cancer diagnosed in 75- sees Dr. Marin Cooley. She also has a history of HTN.   She also has vit D def. >> previously on 1000 units vitamin D daily but above at last visit.  We started 4000 units vitamin D daily  Reviewed vitamin levels: Lab Results  Component Value Date   VD25OH 14.9 (L) 07/07/2020   VD25OH 17.2 (L) 01/01/2018   Lab Results  Component Value Date   [id number] 650 07/07/2020     ROS: + See HPI Neurological: no tremors/no numbness/+ tingling and burning/no dizziness/+ paresthesias - was on Gabapentin before  I reviewed pt's medications, allergies, PMH, social hx, family hx, and changes were documented in the history of present illness. Otherwise, unchanged from my initial visit note.  Past  Medical History:  Diagnosis Date   Acid reflux    Cancer ([place]) 1995   breast cancer   Essential hypertension 02/16/2015   Hypertension    Obesity 02/16/2015   Post-operative nausea and vomiting    Thyroid disease    Vertigo    Past Surgical History:  Procedure Laterality Date   ABDOMINAL HYSTERECTOMY     ESOPHAGEAL DILATION  2006?   LaurenHayes   MASS EXCISION  1995   thymus gland & LEFT VOCAL CORD   MASTECTOMY Left 2007   MASTECTOMY     Social History   Socioeconomic History   Marital status: Single    Spouse name: Not on file   Number of children: Not on file   Years of education: Not on file   Highest education level: Not on file  Occupational History   Not on file  Tobacco Use   Smoking status: Never   Smokeless tobacco: Never   Tobacco comments:    never used tobacco  Vaping Use   Vaping Use: Never used  Substance and Sexual Activity   Alcohol use: No   Drug use: No   Sexual activity: Yes    Birth control/protection: Surgical    Comment: hysterectomy  Other Topics Concern   Not on file  Social History Narrative   Not on file   Social Determinants of Health   Financial Resource Strain: Low Risk    Difficulty of Paying Living Expenses: Not hard at all  Food Insecurity: No Food Insecurity   Worried About Charity fundraiser in the Last Year: Never true   [place] in the Last Year: Never true  Transportation Needs: No Transportation Needs   Lack of Transportation (Medical): No   Lack of Transportation (Non-Medical): No  Physical Activity: Insufficiently Active   Days of Exercise per Week: 3 days   Minutes of Exercise per Session: 30 min  Stress: No Stress Concern Present   Feeling of Stress : Not at all  Social Connections: Moderately Isolated   Frequency of Communication with Friends and Family: More than three times a week   Frequency of Social Gatherings with Friends and Family: More than three times a week   Attends Religious Services: More  than 4 times per year   Active Member of [employer] or Organizations: No   Attends Archivist Meetings: Never   Marital Status: Never married  Human resources officer Violence: Not At Risk   Fear of Current or Ex-Partner: No   Emotionally Abused: No   Physically Abused: No   Sexually Abused: No   Current Outpatient Medications on File Prior to Visit  Medication Sig Dispense Refill   atorvastatin (LIPITOR) 40 MG tablet Take 40 mg by mouth daily as needed. (Patient not taking: Reported on 02/27/2021)     cetirizine (ZYRTEC) 10 MG tablet Take 1 tablet (10 mg total) by mouth daily. 30 tablet 2   cholecalciferol (VITAMIN D) 1000 units tablet Take 1,000  Units by mouth daily.     clobetasol cream (TEMOVATE) 6.16 % Apply 1 application topically 2 (two) times daily. 45 g 1   famotidine (PEPCID) 20 MG tablet Take 1 tablet (20 mg total) by mouth 2 (two) times daily. 30 tablet 0   lidocaine (LIDODERM) 5 % Place 1 patch onto the skin daily. Remove & Discard patch within 12 hours or as directed by MD (Patient not taking: No sig reported) 30 patch 0   lisinopril-hydrochlorothiazide (PRINZIDE,ZESTORETIC) 10-12.5 MG per tablet Take 1 tablet by mouth daily. 30 tablet 2   methocarbamol (ROBAXIN) 500 MG tablet Take 1 tablet (500 mg total) by mouth 2 (two) times daily. (Patient not taking: No sig reported) 14 tablet 0   omeprazole (PRILOSEC) 20 MG capsule Take 1 capsule (20 mg total) by mouth daily. 30 capsule 6   PROVENTIL HFA 108 (90 Base) MCG/ACT inhaler Inhale 2 puffs into the lungs every 4 (four) hours as needed for wheezing or shortness of breath.   3   SYNTHROID 100 MCG tablet Take 1 tablet (100 mcg total) by mouth daily before breakfast. 90 tablet 3   Current Facility-Administered Medications on File Prior to Visit  Medication Dose Route Frequency Provider Last Rate Last Admin   Alcohol (DEHYDRATED ALCOHOL 98%) 98 % injection SOLN 5 mL  5 mL Intravenous Once Docia Barrier, PA       Allergies   Allergen Reactions   Aspirin Swelling   Elemental Sulfur Nausea Only   Family History  Problem Relation Age of Onset   Diabetes Father    Asthma Mother    Thyroid disease Sister    Breast cancer Paternal Aunt    Stomach cancer Paternal Aunt    Colon cancer Neg Hx    Colon polyps Neg Hx    Esophageal cancer Neg Hx    Rectal cancer Neg Hx     PE: There were no vitals taken for this visit. Wt Readings from Last 3 Encounters:  02/27/21 203 lb (92.1 kg)  12/06/20 214 lb (97.1 kg)  09/07/20 217 lb (98.4 kg)   Constitutional: overweight, in NAD Eyes: PERRLA, EOMI, no exophthalmos ENT: moist mucous membranes, no thyromegaly, no neck nodules palpable, no cervical lymphadenopathy Cardiovascular: RRR, No MRG Respiratory: CTA B Gastrointestinal: abdomen soft, NT, ND, BS+ Musculoskeletal: no deformities, strength intact in all 4 Skin: moist, warm, no rashes Neurological: no tremor with outstretched hands, DTR normal in all 4  ASSESSMENT: 1. Thyroid nodule  2.  Hypothyroidism  3.  Tingling in the extremities  PLAN: 1. Thyroid nodule -Patient with history of a large left dominant thyroid nodule, almost entirely cystic, s/p drainage of the cyst and ethanol instillation in 03/2018, states previous aspirations did not prevent reforming of the cyst.  Reviewing the thyroid ultrasound images, they do not show any concerning nodules features: No calcifications, no internal blood flow, no taller than wide distribution and also no infiltration in the surrounding tissue.  Also, she does not have a thyroid cancer family history of personal history of radiation therapy to head or neck.  All these would favor benignity.  Her previous biopsies were inconclusive most likely due to the cystic nature of the nodule. -In the past she had occasional choking, but not recently and it was unclear whether related to thyroid nodule or not -No neck compression symptoms -Since last visit, we checked another  thyroid ultrasound which showed that her thyroid cyst has reformed.  No other nodules or abnormality identified. -  I will see her back in 1 year  2. Hypothyroidism - latest thyroid labs reviewed with pt. >> normal: Lab Results  Component Value Date   TSH 3.09 07/07/2020  - she continues on Synthroid d.a.w. 100 mcg daily (she had choking on the generic levothyroxine tablet) - pt feels good on this dose. - we discussed about taking the thyroid hormone every day, with water, >30 minutes before breakfast, separated by >4 hours from acid reflux medications, calcium, iron, multivitamins. Pt. is taking it correctly. - will check thyroid tests today: TSH and fT4 - If labs are abnormal, she will need to return for repeat TFTs in 1.5 months  3.  Vitamin D deficiency -At last visit, I checked a B12 level and vitamin D level due to tingling in extremities.  Vitamin D was very low, at 14.9 -Restarted vitamin D 4000 units daily -We will recheck her level today  Lauren Kingdom, MD PhD [facility] Endocrinology

## 2021-04-10 ENCOUNTER — Ambulatory Visit: Payer: Medicaid Other | Admitting: Adult Health

## 2021-04-12 ENCOUNTER — Encounter: Payer: Self-pay | Admitting: Internal Medicine

## 2021-04-12 ENCOUNTER — Ambulatory Visit (INDEPENDENT_AMBULATORY_CARE_PROVIDER_SITE_OTHER): Payer: Medicaid Other | Admitting: Internal Medicine

## 2021-04-12 ENCOUNTER — Other Ambulatory Visit: Payer: Self-pay

## 2021-04-12 VITALS — BP 144/86 | HR 94 | Ht 64.0 in | Wt 203.6 lb

## 2021-04-12 DIAGNOSIS — E559 Vitamin D deficiency, unspecified: Secondary | ICD-10-CM

## 2021-04-12 DIAGNOSIS — E890 Postprocedural hypothyroidism: Secondary | ICD-10-CM

## 2021-04-12 DIAGNOSIS — E041 Nontoxic single thyroid nodule: Secondary | ICD-10-CM | POA: Diagnosis not present

## 2021-04-12 LAB — T4, FREE: Free T4: 1.03 ng/dL (ref 0.60–1.60)

## 2021-04-12 LAB — TSH: TSH: 3.07 u[IU]/mL (ref 0.35–5.50)

## 2021-04-12 LAB — VITAMIN D 25 HYDROXY (VIT D DEFICIENCY, FRACTURES): VITD: 24.06 ng/mL — ABNORMAL LOW (ref 30.00–100.00)

## 2021-04-12 MED ORDER — SYNTHROID 100 MCG PO TABS: 100.0000 ug | ORAL_TABLET | Freq: Every day | ORAL | 3 refills | Status: DC

## 2021-04-12 NOTE — Addendum Note (Signed)
Addended by: Philemon Kingdom on: 04/12/2021 01:32 PM   Modules accepted: Orders

## 2021-04-12 NOTE — Patient Instructions (Addendum)
Please stop at the lab.  Continue Synthroid 100 mcg daily.  Take the thyroid hormone every day, with water, at least 30 minutes before breakfast, separated by at least 4 hours from: - acid reflux medications - calcium - iron - multivitamins  For now, continue vitamin D 5000 units daily.  Please come back for a follow-up appointment in 1 year. 

## 2021-04-12 NOTE — Progress Notes (Addendum)
Patient ID: [name], female   DOB: 08/17/1960, 60 y.o.   MRN: [id number]   This visit occurred during the SARS-CoV-2 public health emergency.  Safety protocols were in place, including screening questions prior to the visit, additional usage of staff PPE, and extensive cleaning of exam room while observing appropriate contact time as indicated for disinfecting solutions.   HPI  [name] is a 60 y.o.-year-old female, initially referred by her PCP, Dr. 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  Last visit 1 year ago.  Interim history: She describes occasional hoarseness  -reviewed from her previous thymus surgery but also from recent allergies. She has no complaints otherwise  Left thyroid nodule -Diagnosed in 2015  Reviewed prev. Investigations: She sees ENT (Dr. Redmond Baseman) after her neck Sx in 1997. 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 FNA (09/15/2013): AUS FNA (01/14/2014): FLUS Thyroid U/S (10/04/2017): Complex left lobe nodule 1 measures 4.9 x 3.9 x 3.9 cm and previously measured 3.6 x 2.9 x 3.0 cm. Biopsy has been performed previously. At last visit, I referred her for alcohol instillation in the cyst (Dr. Kathlene Cote): FNA + EtOH instillation (03/31/2018):  Scant ep. cells  Thyroid U/S (02/04/2021): Parenchymal Echotexture: Mildly heterogenous Isthmus: 0.2 cm Right lobe: 2.0 x 0.8 x 0.8 cm Left lobe: 4.9 x 3.4 x 3.4 cm _________________________________________________________   Again seen is a large solid and cystic nodule involving almost the entirety of the left thyroid lobe measuring 4.9 x 3.4 x 3.4 cm, previously 4.9 x 4.0 x 3.9 cm. This nodule was previously biopsied in 2015.   IMPRESSION: Stable appearance of a large left thyroid nodule measuring up to 4.9 cm, essentially unchanged since 2019. This nodule was previously biopsied in 2015, correlate with biopsy  results.  Pt denies: - feeling nodules in neck - dysphagia - choking - SOB with lying down  Hypothyroidism: -postablation -not well controlled.  In the last 1.5 years, we have been decreasing the dose of levothyroxine from 175 to 112 mcg daily. However, at last visit, she was given 125 mcg from the pharmacy. We then decreased back to 112 mcg daily, then to 100 mcg daily.  She takes Synthroid DAW -as she had choking with the generic levothyroxine in the past (although, upon questioning, she is not sure whether she is given the brand name from the pharmacy) -choking improved. - in am - fasting - at least 30 min from b'fast - no calcium - no iron - no multivitamins - + PPIs >4h after LT4 - not on Biotin  Reviewed her TFTs: Lab Results  Component Value Date   TSH 3.09 07/07/2020   TSH 3.39 03/22/2020   TSH 0.11 (L) 03/20/2019   TSH 0.112 (L) 09/06/2018   TSH 0.13 (L) 04/28/2018   TSH <0.01 Repeated and verified X2. (L) 03/10/2018   TSH 0.088 (L) 01/01/2018   TSH 42.838 (H) 07/03/2017   FREET4 1.07 07/07/2020   FREET4 0.95 03/22/2020   FREET4 1.45 03/20/2019   FREET4 1.21 04/28/2018   FREET4 1.58 03/10/2018    She continues to have hot flushes.  She has a family history of thyroid disease in her sister. No FH of thyroid cancer. No h/o radiation tx to head or neck.  No herbal supplements. No Biotin use. No recent steroids use.   She has a history of breast cancer diagnosed in 60- sees Dr. Marin Olp. She also has a  history of HTN.   She also has vit D def. >> previously on 1000 units vitamin D daily >> increased to 4000 units vitamin D daily >> on 5000 units now - missed few doses in last months.  Reviewed vitamin levels: Lab Results  Component Value Date   VD25OH 14.9 (L) 07/07/2020   VD25OH 17.2 (L) 01/01/2018   Lab Results  Component Value Date   [id number] 916 07/07/2020     ROS: + See HPI Neurological: no tremors/no numbness/+ tingling and burning/no  dizziness/+ paresthesias - was on Gabapentin before  I reviewed pt's medications, allergies, PMH, social hx, family hx, and changes were documented in the history of present illness. Otherwise, unchanged from my initial visit note.  Past Medical History:  Diagnosis Date   Acid reflux    Cancer ([place]) 1995   breast cancer   Essential hypertension 02/16/2015   Hypertension    Obesity 02/16/2015   Post-operative nausea and vomiting    Thyroid disease    Vertigo    Past Surgical History:  Procedure Laterality Date   ABDOMINAL HYSTERECTOMY     ESOPHAGEAL DILATION  2006?   Dr.Hayes   MASS EXCISION  1995   thymus gland & LEFT VOCAL CORD   MASTECTOMY Left 2007   MASTECTOMY     Social History   Socioeconomic History   Marital status: Single    Spouse name: Not on file   Number of children: Not on file   Years of education: Not on file   Highest education level: Not on file  Occupational History   Not on file  Tobacco Use   Smoking status: Never   Smokeless tobacco: Never   Tobacco comments:    never used tobacco  Vaping Use   Vaping Use: Never used  Substance and Sexual Activity   Alcohol use: No   Drug use: No   Sexual activity: Yes    Birth control/protection: Surgical    Comment: hysterectomy  Other Topics Concern   Not on file  Social History Narrative   Not on file   Social Determinants of Health   Financial Resource Strain: Low Risk    Difficulty of Paying Living Expenses: Not hard at all  Food Insecurity: No Food Insecurity   Worried About Charity fundraiser in the Last Year: Never true   [place] in the Last Year: Never true  Transportation Needs: No Transportation Needs   Lack of Transportation (Medical): No   Lack of Transportation (Non-Medical): No  Physical Activity: Insufficiently Active   Days of Exercise per Week: 3 days   Minutes of Exercise per Session: 30 min  Stress: No Stress Concern Present   Feeling of Stress : Not at all  Social  Connections: Moderately Isolated   Frequency of Communication with Friends and Family: More than three times a week   Frequency of Social Gatherings with Friends and Family: More than three times a week   Attends Religious Services: More than 4 times per year   Active Member of [employer] or Organizations: No   Attends Archivist Meetings: Never   Marital Status: Never married  Human resources officer Violence: Not At Risk   Fear of Current or Ex-Partner: No   Emotionally Abused: No   Physically Abused: No   Sexually Abused: No   Current Outpatient Medications on File Prior to Visit  Medication Sig Dispense Refill   atorvastatin (LIPITOR) 40 MG tablet Take 40 mg by mouth  daily as needed. (Patient not taking: Reported on 02/27/2021)     cetirizine (ZYRTEC) 10 MG tablet Take 1 tablet (10 mg total) by mouth daily. 30 tablet 2   cholecalciferol (VITAMIN D) 1000 units tablet Take 1,000 Units by mouth daily.     clobetasol cream (TEMOVATE) 3.82 % Apply 1 application topically 2 (two) times daily. 45 g 1   famotidine (PEPCID) 20 MG tablet Take 1 tablet (20 mg total) by mouth 2 (two) times daily. 30 tablet 0   lidocaine (LIDODERM) 5 % Place 1 patch onto the skin daily. Remove & Discard patch within 12 hours or as directed by MD (Patient not taking: No sig reported) 30 patch 0   lisinopril-hydrochlorothiazide (PRINZIDE,ZESTORETIC) 10-12.5 MG per tablet Take 1 tablet by mouth daily. 30 tablet 2   methocarbamol (ROBAXIN) 500 MG tablet Take 1 tablet (500 mg total) by mouth 2 (two) times daily. (Patient not taking: No sig reported) 14 tablet 0   omeprazole (PRILOSEC) 20 MG capsule Take 1 capsule (20 mg total) by mouth daily. 30 capsule 6   PROVENTIL HFA 108 (90 Base) MCG/ACT inhaler Inhale 2 puffs into the lungs every 4 (four) hours as needed for wheezing or shortness of breath.   3   SYNTHROID 100 MCG tablet Take 1 tablet (100 mcg total) by mouth daily before breakfast. 90 tablet 3   Current  Facility-Administered Medications on File Prior to Visit  Medication Dose Route Frequency Provider Last Rate Last Admin   Alcohol (DEHYDRATED ALCOHOL 98%) 98 % injection SOLN 5 mL  5 mL Intravenous Once Docia Barrier, PA       Allergies  Allergen Reactions   Aspirin Swelling   Elemental Sulfur Nausea Only   Family History  Problem Relation Age of Onset   Diabetes Father    Asthma Mother    Thyroid disease Sister    Breast cancer Paternal Aunt    Stomach cancer Paternal Aunt    Colon cancer Neg Hx    Colon polyps Neg Hx    Esophageal cancer Neg Hx    Rectal cancer Neg Hx     PE: BP (!) 144/86 (BP Location: Right Arm, Patient Position: Sitting, Cuff Size: Normal)   Pulse 94   Ht 5\' 4"  (1.626 m)   Wt 203 lb 9.6 oz (92.4 kg)   SpO2 97%   BMI 34.95 kg/m  Wt Readings from Last 3 Encounters:  04/12/21 203 lb 9.6 oz (92.4 kg)  02/27/21 203 lb (92.1 kg)  12/06/20 214 lb (97.1 kg)   Constitutional: overweight, in NAD Eyes: PERRLA, EOMI, no exophthalmos ENT: moist mucous membranes, no thyromegaly, + Palpable left neck fullness, no cervical lymphadenopathy Cardiovascular: RRR, No MRG Respiratory: CTA B Gastrointestinal: abdomen soft, NT, ND, BS+ Musculoskeletal: no deformities, strength intact in all 4 Skin: moist, warm, no rashes Neurological: no tremor with outstretched hands, DTR normal in all 4  ASSESSMENT: 1. Thyroid nodule  2.  Hypothyroidism  3.  Tingling in the extremities  PLAN: 1. Thyroid nodule -Patient with history of a large left dominant thyroid nodule, almost entirely cystic, s/p drainage of the cyst and ethanol instillation in 03/2018 since previous aspirations did not prevent reforming of the cyst, s/p drainage of the cyst.  Reviewing the thyroid ultrasound images, they do not show any concerning nodule features: No calcifications, no internal blood flow, no taller than wide distribution and also no infiltration in the surrounding tissue.  Also,  she does not have a thyroid  cancer family history or personal history of radiation therapy to head or neck.  All these would favor benignity.  Her previous biopsies were inconclusive most likely due to the cystic nature of the nodule -In the past, she had occasional choking but not recently, and it was unclear whether this was related to the thyroid nodule or not.  No choking recently -She denies neck compression symptoms.  She does have hoarseness, which is is chronic for her after her sinus surgery, with occasional exacerbations -Since last visit, we checked another thyroid ultrasound which showed that her thyroid cyst had the same dimensions as before, may be slightly smaller, but without any other change in ultrasound characteristics.  No other nodules or abnormalities identified. -For now, we decided to recheck her ultrasound next year, but no intervention is needed for now. -I will see her back in 1 year  2. Hypothyroidism - latest thyroid labs reviewed with pt. >> normal: Lab Results  Component Value Date   TSH 3.09 07/07/2020  - she continues on Synthroid brand name 100 mcg daily (she had choking on the generic levothyroxine) - pt feels good on this dose. - we discussed about taking the thyroid hormone every day, with water, >30 minutes before breakfast, separated by >4 hours from acid reflux medications, calcium, iron, multivitamins. Pt. is taking it correctly. - will check thyroid tests today: TSH and fT4 - If labs are abnormal, she will need to return for repeat TFTs in 1.5 months  3.  Vitamin D deficiency -At last visit, we checked a B12 and vitamin D level due to tingling in extremities.  Vitamin D was very low, at 14.9.  -At that time, we increased her supplement from 1000 to 4000 units vitamin D daily.  At this visit, she tells me that she is actually taking 5000 units daily.  She did miss doses. -We will recheck her level today  Needs refills.  Component     Latest Ref Rng &  Units 04/12/2021  TSH     0.35 - 5.50 uIU/mL 3.07  T4,Free(Direct)     0.60 - 1.60 ng/dL 1.03  VITD     30.00 - 100.00 ng/mL 24.06 (L)  TFTs are normal.  Vitamin D level did increase, but not to goal.  I will advise her to try to take this consistently and plan to repeat the vitamin D level again in 2 months.  Philemon Kingdom, MD PhD [facility] Endocrinology

## 2021-05-01 ENCOUNTER — Emergency Department (HOSPITAL_COMMUNITY)
Admission: EM | Admit: 2021-05-01 | Discharge: 2021-05-01 | Disposition: A | Discharge status: Home / Self Care | Payer: Medicaid Other | Attending: Emergency Medicine | Admitting: Emergency Medicine

## 2021-05-01 ENCOUNTER — Encounter (HOSPITAL_COMMUNITY): Payer: Self-pay

## 2021-05-01 ENCOUNTER — Other Ambulatory Visit: Payer: Self-pay

## 2021-05-01 DIAGNOSIS — I10 Essential (primary) hypertension: Secondary | ICD-10-CM | POA: Diagnosis not present

## 2021-05-01 DIAGNOSIS — K219 Gastro-esophageal reflux disease without esophagitis: Secondary | ICD-10-CM | POA: Diagnosis not present

## 2021-05-01 DIAGNOSIS — E890 Postprocedural hypothyroidism: Secondary | ICD-10-CM | POA: Insufficient documentation

## 2021-05-01 DIAGNOSIS — Z79899 Other long term (current) drug therapy: Secondary | ICD-10-CM | POA: Insufficient documentation

## 2021-05-01 DIAGNOSIS — R1013 Epigastric pain: Secondary | ICD-10-CM | POA: Diagnosis present

## 2021-05-01 DIAGNOSIS — Z853 Personal history of malignant neoplasm of breast: Secondary | ICD-10-CM | POA: Diagnosis not present

## 2021-05-01 LAB — CBC WITH DIFFERENTIAL/PLATELET
Abs Immature Granulocytes: 0 10*3/uL (ref 0.00–0.07)
Basophils Absolute: 0 10*3/uL (ref 0.0–0.1)
Basophils Relative: 1 %
Eosinophils Absolute: 0.1 10*3/uL (ref 0.0–0.5)
Eosinophils Relative: 2 %
HCT: 41.7 % (ref 36.0–46.0)
Hemoglobin: 13.6 g/dL (ref 12.0–15.0)
Immature Granulocytes: 0 %
Lymphocytes Relative: 41 %
Lymphs Abs: 2.4 10*3/uL (ref 0.7–4.0)
MCH: 27.4 pg (ref 26.0–34.0)
MCHC: 32.6 g/dL (ref 30.0–36.0)
MCV: 83.9 fL (ref 80.0–100.0)
Monocytes Absolute: 0.4 10*3/uL (ref 0.1–1.0)
Monocytes Relative: 6 %
Neutro Abs: 2.9 10*3/uL (ref 1.7–7.7)
Neutrophils Relative %: 50 %
Platelets: 261 10*3/uL (ref 150–400)
RBC: 4.97 MIL/uL (ref 3.87–5.11)
RDW: 12.9 % (ref 11.5–15.5)
WBC: 5.8 10*3/uL (ref 4.0–10.5)
nRBC: 0 % (ref 0.0–0.2)

## 2021-05-01 LAB — COMPREHENSIVE METABOLIC PANEL
ALT: 13 U/L (ref 0–44)
AST: 16 U/L (ref 15–41)
Albumin: 4.3 g/dL (ref 3.5–5.0)
Alkaline Phosphatase: 73 U/L (ref 38–126)
Anion gap: 9 (ref 5–15)
BUN: 9 mg/dL (ref 6–20)
CO2: 25 mmol/L (ref 22–32)
Calcium: 9.6 mg/dL (ref 8.9–10.3)
Chloride: 104 mmol/L (ref 98–111)
Creatinine, Ser: 0.75 mg/dL (ref 0.44–1.00)
GFR, Estimated: >60 mL/min (ref >60)
Glucose, Bld: 99 mg/dL (ref 70–99)
Potassium: 3.7 mmol/L (ref 3.5–5.1)
Sodium: 138 mmol/L (ref 135–145)
Total Bilirubin: 0.7 mg/dL (ref 0.3–1.2)
Total Protein: 7.8 g/dL (ref 6.5–8.1)

## 2021-05-01 LAB — URINALYSIS, ROUTINE W REFLEX MICROSCOPIC
Bilirubin Urine: NEGATIVE
Glucose, UA: NEGATIVE mg/dL
Ketones, ur: NEGATIVE mg/dL
Nitrite: NEGATIVE
Protein, ur: NEGATIVE mg/dL
Specific Gravity, Urine: 1.004 — ABNORMAL LOW (ref 1.005–1.030)
pH: 5 (ref 5.0–8.0)

## 2021-05-01 LAB — LIPASE, BLOOD: Lipase: 43 U/L (ref 11–51)

## 2021-05-01 NOTE — ED Provider Notes (Signed)
Emergency Medicine Provider Triage Evaluation Note  [name] , a 60 y.o. female  was evaluated in triage.  Pt complains of reflux disease.  Patient has been trying famotidine and Prilosec without any improvement this last week and a half.  She feels like something is sitting on her throat.  Also endorses a burning sensation in her epigastric area that comes and goes.  There is nausea but no vomiting.  No urinary symptoms..  Review of Systems  Positive: Epigastric pain, reflux disease, dysphagia, glomus Negative:   Physical Exam  BP (!) 148/78 (BP Location: Right Arm)   Pulse 86   Temp 98.2 F (36.8 C) (Oral)   Resp 16   SpO2 100%  Gen:   Awake, no distress   Resp:  Normal effort  MSK:   Moves extremities without difficulty  Other:  Abdomen is soft, not particularly tender  Medical Decision Making  Medically screening exam initiated at 5:44 PM.  Appropriate orders placed.  CARLA RASHAD was informed that the remainder of the evaluation will be completed by another provider, this initial triage assessment does not replace that evaluation, and the importance of remaining in the ED until their evaluation is complete.  Abdominal labs given epigastric pain   Sherrill Raring, PA-C 05/01/21 1744    Regan Lemming, MD 05/01/21 1958

## 2021-05-01 NOTE — ED Provider Notes (Signed)
[facility] EMERGENCY DEPARTMENT Provider Note   CSN: [id number] Arrival date & time: 05/01/21  1647     History No chief complaint on file.   [name] is a 60 y.o. female.  Patient presents to the emergency department for evaluation of acid reflux.  Patient reports a history of acid reflux, normally fairly well controlled with her Prilosec.  She has been having epigastric burning pain up into the neck and throat area since she missed a dose of Prilosec.  She did end up taking a dose of 1 pill with a dose of Pepcid and now the symptoms have resolved.  No shortness of breath.  No nausea, vomiting or diarrhea.       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   Dr.Hayes   MASS EXCISION  1995   thymus gland & LEFT VOCAL CORD   MASTECTOMY Left 2007   MASTECTOMY       OB History     Gravida  2   Para  2   Term  2   Preterm      AB      Living  2      SAB      IAB      Ectopic      Multiple      Live Births              Family History  Problem Relation Age of Onset   Diabetes Father    Asthma Mother    Thyroid disease Sister    Breast cancer Paternal Aunt    Stomach cancer Paternal Aunt    Colon cancer Neg Hx    Colon polyps Neg Hx    Esophageal cancer Neg Hx    Rectal cancer Neg Hx     Social History   Tobacco Use    Smoking status: Never   Smokeless tobacco: Never   Tobacco comments:    never used tobacco  Vaping Use   Vaping Use: Never used  Substance Use Topics   Alcohol use: No   Drug use: No    Home Medications Prior to Admission medications   Medication Sig Start Date End Date Taking? Authorizing Provider  atorvastatin (LIPITOR) 40 MG tablet Take 40 mg by mouth daily as needed. Patient not taking: Reported on 02/27/2021 01/28/21   [provider]  cetirizine (ZYRTEC) 10 MG tablet Take 1 tablet (10 mg total) by mouth daily. 11/30/20   Hazel Sams, PA-C  cholecalciferol (VITAMIN D) 1000 units tablet Take 1,000 Units by mouth daily.    [provider]  clobetasol cream (TEMOVATE) 4.09 % Apply 1 application topically 2 (two) times daily. 02/27/21   Estill Dooms, NP  famotidine (PEPCID) 20 MG tablet Take 1 tablet (20 mg total) by mouth  2 (two) times daily. 12/06/20   Valarie Merino, MD  lidocaine (LIDODERM) 5 % Place 1 patch onto the skin daily. Remove & Discard patch within 12 hours or as directed by MD Patient not taking: No sig reported 09/08/20   Palumbo, April, MD  lisinopril-hydrochlorothiazide (PRINZIDE,ZESTORETIC) 10-12.5 MG per tablet Take 1 tablet by mouth daily. 03/01/15   Volanda Napoleon, MD  methocarbamol (ROBAXIN) 500 MG tablet Take 1 tablet (500 mg total) by mouth 2 (two) times daily. Patient not taking: No sig reported 09/08/20   Palumbo, April, MD  omeprazole (PRILOSEC) 20 MG capsule Take 1 capsule (20 mg total) by mouth daily. 09/09/15   Volanda Napoleon, MD  PROVENTIL HFA 108 [phone] Base) MCG/ACT inhaler Inhale 2 puffs into the lungs every 4 (four) hours as needed for wheezing or shortness of breath.  04/06/18   [provider]  SYNTHROID 100 MCG tablet Take 1 tablet (100 mcg total) by mouth daily before breakfast. 04/12/21   Philemon Kingdom, MD    Allergies    Aspirin and Elemental sulfur  Review of Systems   Review of Systems   Gastrointestinal:  Positive for abdominal pain.  All other systems reviewed and are negative.  Physical Exam Updated Vital Signs BP (!) 166/86 (BP Location: Right Arm)   Pulse 74   Temp 98.5 F (36.9 C)   Resp 14   SpO2 100%   Physical Exam Vitals and nursing note reviewed.  Constitutional:      General: She is not in acute distress.    Appearance: Normal appearance. She is well-developed.  HENT:     Head: Normocephalic and atraumatic.     Right Ear: Hearing normal.     Left Ear: Hearing normal.     Nose: Nose normal.  Eyes:     Conjunctiva/sclera: Conjunctivae normal.     Pupils: Pupils are equal, round, and reactive to light.  Cardiovascular:     Rate and Rhythm: Regular rhythm.     Heart sounds: S1 normal and S2 normal. No murmur heard.   No friction rub. No gallop.  Pulmonary:     Effort: Pulmonary effort is normal. No respiratory distress.     Breath sounds: Normal breath sounds.  Chest:     Chest wall: No tenderness.  Abdominal:     General: Bowel sounds are normal.     Palpations: Abdomen is soft.     Tenderness: There is abdominal tenderness in the epigastric area. There is no guarding or rebound. Negative signs include Murphy's sign and McBurney's sign.     Hernia: No hernia is present.  Musculoskeletal:        General: Normal range of motion.     Cervical back: Normal range of motion and neck supple.  Skin:    General: Skin is warm and dry.     Findings: No rash.  Neurological:     Mental Status: She is alert and oriented to person, place, and time.     GCS: GCS eye subscore is 4. GCS verbal subscore is 5. GCS motor subscore is 6.     Cranial Nerves: No cranial nerve deficit.     Sensory: No sensory deficit.     Coordination: Coordination normal.  Psychiatric:        Speech: Speech normal.        Behavior: Behavior normal.        Thought Content: Thought content normal.    ED Results / Procedures / Treatments  Labs (all labs ordered are listed,  but only abnormal results are displayed) Labs Reviewed  URINALYSIS, ROUTINE W REFLEX MICROSCOPIC - Abnormal; Notable for the following components:      Result Value   Color, Urine STRAW (*)    Specific Gravity, Urine 1.004 (*)    Hgb urine dipstick MODERATE (*)    Leukocytes,Ua MODERATE (*)    Bacteria, UA RARE (*)    All other components within normal limits  CBC WITH DIFFERENTIAL/PLATELET  COMPREHENSIVE METABOLIC PANEL  LIPASE, BLOOD    EKG EKG Interpretation  Date/Time:  Monday May 01 2021 23:26:31 EST Ventricular Rate:  61 PR Interval:  132 QRS Duration: 84 QT Interval:  406 QTC Calculation: 408 R Axis:   51 Text Interpretation: Normal sinus rhythm ST & T wave abnormality, consider inferior ischemia Abnormal ECG No significant change since last tracing Confirmed by Orpah Greek [id number]) on 05/01/2021 11:29:22 PM  Radiology No results found.  Procedures Procedures   Medications Ordered in ED Medications - No data to display  ED Course  I have reviewed the triage vital signs and the nursing notes.  Pertinent labs & imaging results that were available during my care of the patient were reviewed by me and considered in my medical decision making (see chart for details).    MDM Rules/Calculators/A&P                           Patient reports symptoms consistent with her acid reflux that have occurred since she missed a Prilosec dose.  She was able to take a dose this afternoon and also took some Pepcid.  Patient reports that after she arrived in the ER her symptoms have significantly improved.  She still has a very slight tenderness in the epigastric area but no guarding or rebound.  No right upper quadrant tenderness to suggest gallbladder disease.  No lower abdominal or pelvic pain or tenderness.  Symptoms do not seem consistent with cardiac etiology.  EKG is unchanged from prior.  Lab work is very reassuring.  Normal white count.  Normal LFTs.  Normal  lipase.  Patient likely experiencing some rebound reflux symptoms after missing her PPI that have now improved after taking her dose plus an H2 blocker.  As her examination and work-up are reassuring, Final Clinical Impression(s) / ED Diagnoses Final diagnoses:  Gastroesophageal reflux disease, unspecified whether esophagitis present    Rx / DC Orders ED Discharge Orders     None        Orpah Greek, MD 05/01/21 2330

## 2021-05-01 NOTE — Discharge Instructions (Signed)
Take your Prilosec every day as prescribed.

## 2021-05-01 NOTE — ED Triage Notes (Signed)
Patient complains of GERD issue x 1 day. States that she takes prilosec and pepcid daily but missed yesterday. Reports increased burping with same. Horseness noted

## 2021-05-11 ENCOUNTER — Emergency Department (HOSPITAL_COMMUNITY)
Admission: EM | Admit: 2021-05-11 | Discharge: 2021-05-12 | Disposition: A | Discharge status: Home / Self Care | Payer: Medicaid Other | Attending: Emergency Medicine | Admitting: Emergency Medicine

## 2021-05-11 ENCOUNTER — Other Ambulatory Visit: Payer: Self-pay

## 2021-05-11 ENCOUNTER — Encounter (HOSPITAL_COMMUNITY): Payer: Self-pay

## 2021-05-11 DIAGNOSIS — R1010 Upper abdominal pain, unspecified: Secondary | ICD-10-CM | POA: Diagnosis present

## 2021-05-11 DIAGNOSIS — I10 Essential (primary) hypertension: Secondary | ICD-10-CM | POA: Insufficient documentation

## 2021-05-11 DIAGNOSIS — Z79899 Other long term (current) drug therapy: Secondary | ICD-10-CM | POA: Insufficient documentation

## 2021-05-11 DIAGNOSIS — Z853 Personal history of malignant neoplasm of breast: Secondary | ICD-10-CM | POA: Diagnosis not present

## 2021-05-11 DIAGNOSIS — K219 Gastro-esophageal reflux disease without esophagitis: Secondary | ICD-10-CM | POA: Insufficient documentation

## 2021-05-11 NOTE — ED Triage Notes (Signed)
Patient arrived with complaints of acid reflux over the last two weeks. Taking Pepcid and Prilosec with no relief. Horse in triage.

## 2021-05-12 MED ORDER — MAALOX MAX 400-400-40 MG/5ML PO SUSP: 5.0000 mL | Freq: Four times a day (QID) | ORAL | 0 refills | Status: AC | PRN

## 2021-05-12 MED ORDER — SUCRALFATE 1 GM/10ML PO SUSP: 1.0000 g | Freq: Three times a day (TID) | ORAL | 0 refills | Status: DC

## 2021-05-12 NOTE — ED Provider Notes (Signed)
[facility] DEPT Provider Note   CSN: [id number] Arrival date & time: 05/11/21  2049     History Chief Complaint  Patient presents with   Gastroesophageal Reflux    [name] is a 60 y.o. female presenting for evaluation of intermittent upper abdominal burning.  Patient states that the past 2 weeks, she has had intermittent upper abdominal discomfort.  This is most consistent with her acid reflux.  She is been taking omeprazole and famotidine without significant improvement.  Pain is worse after eating and at night.  She has not seen a GI doctor about this.  She was also recently started on a antibiotic for sinusitis, feels symptoms worsened after starting the antibiotic.  She has associated nausea, no vomiting.  She denies fevers, chills, chest pain, shortness of breath, lower abdominal pain, urinary symptoms, abnormal bowel movements. Currently pt has no abd pain.   Additional history taken chart reviewed.  History of GERD, breast cancer status postmastectomy, hypertension, hypothyroidism.  I reviewed visit from 11/21 for the same.  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   Dr.Hayes   MASS EXCISION  1995   thymus gland & LEFT VOCAL CORD   MASTECTOMY Left 2007   MASTECTOMY       OB History     Gravida  2   Para  2   Term  2    Preterm      AB      Living  2      SAB      IAB      Ectopic      Multiple      Live Births              Family History  Problem Relation Age of Onset   Diabetes Father    Asthma Mother    Thyroid disease Sister    Breast cancer Paternal Aunt    Stomach cancer Paternal Aunt    Colon cancer Neg Hx    Colon polyps Neg Hx    Esophageal cancer Neg Hx    Rectal cancer Neg Hx     Social History   Tobacco Use   Smoking status: Never   Smokeless tobacco: Never   Tobacco comments:    never used tobacco  Vaping Use   Vaping Use: Never used  Substance Use Topics   Alcohol use: No   Drug use: No    Home Medications Prior to Admission medications   Medication Sig Start Date End Date Taking? Authorizing Provider  alum & mag hydroxide-simeth (MAALOX MAX) 400-400-40 MG/5ML suspension Take 5 mLs by mouth every 6 (six) hours as needed for indigestion. 05/12/21  Yes Liyanna Cartwright, PA-C  sucralfate (CARAFATE) 1 GM/10ML suspension Take 10 mLs (1 g total) by  mouth 4 (four) times daily -  with meals and at bedtime. 05/12/21  Yes Shamina Etheridge, PA-C  atorvastatin (LIPITOR) 40 MG tablet Take 40 mg by mouth daily as needed. Patient not taking: Reported on 02/27/2021 01/28/21   [provider]  cetirizine (ZYRTEC) 10 MG tablet Take 1 tablet (10 mg total) by mouth daily. 11/30/20   Hazel Sams, PA-C  cholecalciferol (VITAMIN D) 1000 units tablet Take 1,000 Units by mouth daily.    [provider]  clobetasol cream (TEMOVATE) 9.62 % Apply 1 application topically 2 (two) times daily. 02/27/21   Estill Dooms, NP  famotidine (PEPCID) 20 MG tablet Take 1 tablet (20 mg total) by mouth 2 (two) times daily. 12/06/20   Valarie Merino, MD  lidocaine (LIDODERM) 5 % Place 1 patch onto the skin daily. Remove & Discard patch within 12 hours or as directed by MD Patient not taking: No sig reported 09/08/20   Palumbo, April, MD  lisinopril-hydrochlorothiazide  (PRINZIDE,ZESTORETIC) 10-12.5 MG per tablet Take 1 tablet by mouth daily. 03/01/15   Volanda Napoleon, MD  methocarbamol (ROBAXIN) 500 MG tablet Take 1 tablet (500 mg total) by mouth 2 (two) times daily. Patient not taking: No sig reported 09/08/20   Palumbo, April, MD  omeprazole (PRILOSEC) 20 MG capsule Take 1 capsule (20 mg total) by mouth daily. 09/09/15   Volanda Napoleon, MD  PROVENTIL HFA 108 [phone] Base) MCG/ACT inhaler Inhale 2 puffs into the lungs every 4 (four) hours as needed for wheezing or shortness of breath.  04/06/18   [provider]  SYNTHROID 100 MCG tablet Take 1 tablet (100 mcg total) by mouth daily before breakfast. 04/12/21   Philemon Kingdom, MD    Allergies    Aspirin and Elemental sulfur  Review of Systems   Review of Systems  Gastrointestinal:  Positive for abdominal pain and nausea.  All other systems reviewed and are negative.  Physical Exam Updated Vital Signs BP 106/78   Pulse 78   Temp 98.5 F (36.9 C) (Oral)   Resp 16   Ht 5\' 4"  (1.626 m)   Wt 92.1 kg   SpO2 97%   BMI 34.84 kg/m   Physical Exam Vitals and nursing note reviewed.  Constitutional:      General: She is not in acute distress.    Appearance: Normal appearance. She is obese.     Comments: Resting in the bed in NAD  HENT:     Head: Normocephalic and atraumatic.     Comments: Hoarse, baseline per pt Eyes:     Conjunctiva/sclera: Conjunctivae normal.     Pupils: Pupils are equal, round, and reactive to light.  Cardiovascular:     Rate and Rhythm: Normal rate and regular rhythm.     Pulses: Normal pulses.  Pulmonary:     Effort: Pulmonary effort is normal. No respiratory distress.     Breath sounds: Normal breath sounds. No wheezing.     Comments: Speaking in full sentences.  Clear lung sounds in all fields. Abdominal:     General: There is no distension.     Palpations: Abdomen is soft. There is no mass.     Tenderness: There is no abdominal tenderness. There is no  guarding or rebound.     Comments: No ttp of the abd. No rigidity, guarding, distention.   Musculoskeletal:        General: Normal range of motion.     Cervical back: Normal range of motion  and neck supple.  Skin:    General: Skin is warm and dry.     Capillary Refill: Capillary refill takes less than 2 seconds.  Neurological:     Mental Status: She is alert and oriented to person, place, and time.  Psychiatric:        Mood and Affect: Mood and affect normal.        Speech: Speech normal.        Behavior: Behavior normal.    ED Results / Procedures / Treatments   Labs (all labs ordered are listed, but only abnormal results are displayed) Labs Reviewed - No data to display  EKG None  Radiology No results found.  Procedures Procedures   Medications Ordered in ED Medications - No data to display  ED Course  I have reviewed the triage vital signs and the nursing notes.  Pertinent labs & imaging results that were available during my care of the patient were reviewed by me and considered in my medical decision making (see chart for details).    MDM Rules/Calculators/A&P                           Patient presenting for evaluation of intermittent upper abdominal pain.  On exam, patient peers nontoxic.  She has no pain currently.  Abdominal exam is reassuring, no tenderness to palpation.  Symptoms are worse at night and after eating, described as a burning pain that goes up to her throat, likely reflux.  As patient is afebrile and without vomiting, low suspicion for gallbladder disease, especially without tenderness.  Offered labs, patient declined.  Discussed symptomatic management including continuing her PPI and H2 blocker.  We will add on Carafate and Maalox.  Information given to follow-up with GI.  At this time, patient appears safe for discharge.  Return precautions given.  Patient states she understands and agrees to plan  Final Clinical Impression(s) / ED Diagnoses Final  diagnoses:  Gastroesophageal reflux disease, unspecified whether esophagitis present    Rx / DC Orders ED Discharge Orders          Ordered    sucralfate (CARAFATE) 1 GM/10ML suspension  3 times daily with meals & bedtime        05/12/21 0548    alum & mag hydroxide-simeth (MAALOX MAX) 389-373-42 MG/5ML suspension  Every 6 hours PRN        05/12/21 0548             Franchot Heidelberg, PA-C 05/12/21 0604    Molpus, Jenny Reichmann, MD 05/12/21 [id number]

## 2021-05-12 NOTE — Discharge Instructions (Signed)
Continue taking famotidine and omeprazole as prescribed. Start taking the Carafate solution before meals and before bedtime to decrease symptoms. If you have breakthrough pain despite all these medications, use of Maalox suspension to help with your pain. Call the GI doctor listed below to set up a follow-up appointment. Return to the emergency room if you develop fevers, persistent vomiting, blood in your vomit, severe worsening abdominal pain, any new, worsening, or concerning symptoms

## 2021-05-15 ENCOUNTER — Ambulatory Visit: Payer: Medicaid Other | Admitting: Internal Medicine

## 2021-05-26 ENCOUNTER — Ambulatory Visit: Payer: Medicaid Other | Admitting: Gastroenterology

## 2021-06-24 ENCOUNTER — Emergency Department (HOSPITAL_BASED_OUTPATIENT_CLINIC_OR_DEPARTMENT_OTHER)
Admission: EM | Admit: 2021-06-24 | Discharge: 2021-06-24 | Disposition: A | Discharge status: Home / Self Care | Payer: Medicaid Other | Attending: Emergency Medicine | Admitting: Emergency Medicine

## 2021-06-24 ENCOUNTER — Encounter (HOSPITAL_BASED_OUTPATIENT_CLINIC_OR_DEPARTMENT_OTHER): Payer: Self-pay

## 2021-06-24 ENCOUNTER — Other Ambulatory Visit: Payer: Self-pay

## 2021-06-24 DIAGNOSIS — S8992XA Unspecified injury of left lower leg, initial encounter: Secondary | ICD-10-CM | POA: Insufficient documentation

## 2021-06-24 DIAGNOSIS — X58XXXA Exposure to other specified factors, initial encounter: Secondary | ICD-10-CM | POA: Insufficient documentation

## 2021-06-24 NOTE — ED Triage Notes (Signed)
Pt has a small bump to L lateral ankle that has been present x 1 week. The area is not painful or discolored.

## 2021-06-24 NOTE — ED Provider Notes (Signed)
[facility] EMERGENCY DEPT Provider Note   CSN: [id number] Arrival date & time: 06/24/21  1401     History  Chief Complaint  Patient presents with   Cyst    [name] is a 61 y.o. female here with small bump on her left lower leg.  She banged her leg on a door 7 days ago, and noted a small, painless bump on her left lower leg, above her ankle, on the lateral aspect.  It isn't itchy.  It hasn't gotten bigger.  She wanted it "check out."  Denies hx of DVT or clotting.  HPI     Home Medications Prior to Admission medications   Medication Sig Start Date End Date Taking? Authorizing Provider  alum & mag hydroxide-simeth (MAALOX MAX) 400-400-40 MG/5ML suspension Take 5 mLs by mouth every 6 (six) hours as needed for indigestion. 05/12/21   Caccavale, Sophia, PA-C  atorvastatin (LIPITOR) 40 MG tablet Take 40 mg by mouth daily as needed. Patient not taking: Reported on 02/27/2021 01/28/21   [provider]  cetirizine (ZYRTEC) 10 MG tablet Take 1 tablet (10 mg total) by mouth daily. 11/30/20   Hazel Sams, PA-C  cholecalciferol (VITAMIN D) 1000 units tablet Take 1,000 Units by mouth daily.    [provider]  clobetasol cream (TEMOVATE) 6.83 % Apply 1 application topically 2 (two) times daily. 02/27/21   Estill Dooms, NP  famotidine (PEPCID) 20 MG tablet Take 1 tablet (20 mg total) by mouth 2 (two) times daily. 12/06/20   Valarie Merino, MD  lidocaine (LIDODERM) 5 % Place 1 patch onto the skin daily. Remove & Discard patch within 12 hours or as directed by MD Patient not taking: No sig reported 09/08/20   Palumbo, April, MD  lisinopril-hydrochlorothiazide (PRINZIDE,ZESTORETIC) 10-12.5 MG per tablet Take 1 tablet by mouth daily. 03/01/15   Volanda Napoleon, MD  methocarbamol (ROBAXIN) 500 MG tablet Take 1 tablet (500 mg total) by mouth 2 (two) times daily. Patient not taking: No sig reported 09/08/20   Palumbo, April, MD  omeprazole (PRILOSEC) 20  MG capsule Take 1 capsule (20 mg total) by mouth daily. 09/09/15   Volanda Napoleon, MD  PROVENTIL HFA 108 [phone] Base) MCG/ACT inhaler Inhale 2 puffs into the lungs every 4 (four) hours as needed for wheezing or shortness of breath.  04/06/18   [provider]  sucralfate (CARAFATE) 1 GM/10ML suspension Take 10 mLs (1 g total) by mouth 4 (four) times daily -  with meals and at bedtime. 05/12/21   Caccavale, Sophia, PA-C  SYNTHROID 100 MCG tablet Take 1 tablet (100 mcg total) by mouth daily before breakfast. 04/12/21   Philemon Kingdom, MD      Allergies    Aspirin and Elemental sulfur    Review of Systems   Review of Systems  Physical Exam Updated Vital Signs BP 131/80 (BP Location: Right Arm)    Pulse 81    Temp 97.9 F (36.6 C)    Resp 16    Ht 5\' 4"  (1.626 m)    Wt 95.3 kg    SpO2 99%    BMI 36.05 kg/m  Physical Exam Constitutional:      General: She is not in acute distress. HENT:     Head: Normocephalic and atraumatic.  Cardiovascular:     Rate and Rhythm: Normal rate and regular rhythm.     Pulses: Normal pulses.  Musculoskeletal:     Comments: Small 0.5 painless lump on  left lower lateral leg, no erhythma No posterior tenderness of the lateral or medial malleoli No tenderness to palpation at the base of the 5th metatarsal No tenderness of the midfoot near navicula  Patient able to bear weight following injury and in the ED   Skin:    General: Skin is warm and dry.  Neurological:     General: No focal deficit present.     Mental Status: She is alert and oriented to person, place, and time. Mental status is at baseline.     Sensory: No sensory deficit.  Psychiatric:        Mood and Affect: Mood normal.        Behavior: Behavior normal.    ED Results / Procedures / Treatments   Labs (all labs ordered are listed, but only abnormal results are displayed) Labs Reviewed - No data to display  EKG None  Radiology No results found.  Procedures Procedures     Medications Ordered in ED Medications - No data to display  ED Course/ Medical Decision Making/ A&P                           Medical Decision Making  Consistent with small hematoma from trauma - advised patient to keep an eye on it, should improve over next 4 weeks.  Doesn't look like DVT, cellulitis, infection, no acute DVT risk factors, and this is quite superficial in location  Doubt broken bones per her exam, no bony pain, walking comfortably        Final Clinical Impression(s) / ED Diagnoses Final diagnoses:  Injury of left lower extremity, initial encounter    Rx / DC Orders ED Discharge Orders     None         Norina Cowper, Carola Rhine, MD 06/24/21 1846

## 2021-06-24 NOTE — Discharge Instructions (Addendum)
Follow up with your doctor's office if you have any further issues with this spot on your leg.

## 2021-07-05 ENCOUNTER — Ambulatory Visit: Payer: Medicaid Other | Admitting: Gastroenterology

## 2021-07-05 ENCOUNTER — Encounter: Payer: Self-pay | Admitting: Gastroenterology

## 2021-07-05 VITALS — BP 102/60 | HR 84 | Ht 63.25 in | Wt 197.2 lb

## 2021-07-05 DIAGNOSIS — R140 Abdominal distension (gaseous): Secondary | ICD-10-CM | POA: Diagnosis not present

## 2021-07-05 DIAGNOSIS — R142 Eructation: Secondary | ICD-10-CM | POA: Diagnosis not present

## 2021-07-05 DIAGNOSIS — K5909 Other constipation: Secondary | ICD-10-CM

## 2021-07-05 DIAGNOSIS — Z86010 Personal history of colonic polyps: Secondary | ICD-10-CM | POA: Diagnosis not present

## 2021-07-05 DIAGNOSIS — R1084 Generalized abdominal pain: Secondary | ICD-10-CM

## 2021-07-05 DIAGNOSIS — R12 Heartburn: Secondary | ICD-10-CM

## 2021-07-05 MED ORDER — POLYETHYLENE GLYCOL 3350 17 GM/SCOOP PO POWD: ORAL | 3 refills | Status: DC

## 2021-07-05 MED ORDER — NA SULFATE-K SULFATE-MG SULF 17.5-3.13-1.6 GM/177ML PO SOLN: 1.0000 | Freq: Once | ORAL | 0 refills | Status: AC

## 2021-07-05 NOTE — Progress Notes (Signed)
[facility] Consult Note:  History: [name] 07/05/2021  Referring provider: Caryl Bis, MD  Reason for consult/chief complaint: Gastroesophageal Reflux, Bloated, Dysphagia (Discomfort swallowing), Cough, and Abdominal Pain (Epigastric/upper abd pains)   Subjective  HPI:  This is a 61 year old woman referred by her primary care provider to see [place] for digestive symptoms and reported "GERD".  ENT visit July '22 and most recently 05/25/21 for vocal cord paralysis (thymectomy 1997) and LPR: "[name] is a 61 y.o. female who presents as a return patient with a chief complaint of throat concerns. She felt that going up to 40 mg omeprazole was too strong and made her reflux worse. She went to the ER twice due to stomach bloating and difficulty swallowing. She was not given any specific treatment. She decided to reduce her dosage to 20 mg once daily and does not feel as bloated. She still takes famotidine 20 mg once daily. Her throat is feeling better overall. "  ED visits June and Nov '22 for dysphagia and GERD  For screening colonoscopy with me 07/07/2018: 2 adenomatous polyps, one greater than 10 mm at the hepatic flexure.  3-year recall recommended.  [name] describes at least a couple years of "reflux", described as generalized abdominal bloating, frequent belching and sometimes a feeling of a knot where things stuck in in the throat.  She has occasional heartburn for which she takes omeprazole 20 mg a day.  Escalating to 40 mg at some point in the past made her bloating and belching worse and a feeling of dryness in the chest.  She has chronic constipation, often going 2 or 3 days between BMs.  She has fruits and vegetables "some days", but says some do not agree with her.  She has not used any particular laxatives.  Denies rectal bleeding  ROS:  Review of Systems  Constitutional:  Negative for appetite change and unexpected weight change.  HENT:   Positive for voice change. Negative for mouth sores.   Eyes:  Negative for pain and redness.  Respiratory:  Negative for cough and shortness of breath.   Cardiovascular:  Negative for chest pain and palpitations.  Genitourinary:  Negative for dysuria and hematuria.  Musculoskeletal:  Negative for arthralgias and myalgias.  Skin:  Negative for pallor and rash.  Neurological:  Negative for weakness and headaches.  Hematological:  Negative for adenopathy.  Many years of dysphonia from left vocal cord paralysis  Past Medical History: Past Medical History:  Diagnosis Date   Acid reflux    Cancer ([place]) 1995   breast cancer   Essential hypertension 02/16/2015   Hypertension    Obesity 02/16/2015   Post-operative nausea and vomiting    Thyroid disease    Vertigo    Laryngoscopy July '22: "Informed consent was obtainted including a discussion of risks, benefits, and alternatives. The patient was placed in a sitting position and the nasal passages sprayed with a combination of Afrin and Xyolcaine. The fiberoptic laryngoscope was then placed through the nasal passage to view the pharynx and larynx. After completion, the telescope was removed. Findings included normal nasal passages, no mass or abnormality in the nasopharynx, and no mass or ulceration in the pharynx or larynx. Pyriform sinuses are open. Secretions are minimal. Vocal folds are without mass, scarring, or ulceration. The left vocal fold is immobile and in a lateralized position. There is a large glottal gap with phonation. Muscle tension patterns are not present.  Laryngeal edema is minimal."  Past Surgical History: Past Surgical History:  Procedure Laterality Date   ABDOMINAL HYSTERECTOMY     ESOPHAGEAL DILATION  2006?   Dr.Hayes   MASS EXCISION  1995   thymus gland & LEFT VOCAL CORD   MASTECTOMY Left 2007   MASTECTOMY       Family History: Family History  Problem Relation Age of Onset   Diabetes Father    Asthma Mother     Thyroid disease Sister    Breast cancer Paternal Aunt    Stomach cancer Paternal Aunt    Colon cancer Neg Hx    Colon polyps Neg Hx    Esophageal cancer Neg Hx    Rectal cancer Neg Hx     Social History: Social History   Socioeconomic History   Marital status: Single    Spouse name: Not on file   Number of children: Not on file   Years of education: Not on file   Highest education level: Not on file  Occupational History   Not on file  Tobacco Use   Smoking status: Never   Smokeless tobacco: Never   Tobacco comments:    never used tobacco  Vaping Use   Vaping Use: Never used  Substance and Sexual Activity   Alcohol use: No   Drug use: No   Sexual activity: Yes    Birth control/protection: Surgical    Comment: hysterectomy  Other Topics Concern   Not on file  Social History Narrative   Not on file   Social Determinants of Health   Financial Resource Strain: Low Risk    Difficulty of Paying Living Expenses: Not hard at all  Food Insecurity: No Food Insecurity   Worried About Charity fundraiser in the Last Year: Never true   Ran Out of Food in the Last Year: Never true  Transportation Needs: No Transportation Needs   Lack of Transportation (Medical): No   Lack of Transportation (Non-Medical): No  Physical Activity: Insufficiently Active   Days of Exercise per Week: 3 days   Minutes of Exercise per Session: 30 min  Stress: No Stress Concern Present   Feeling of Stress : Not at all  Social Connections: Moderately Isolated   Frequency of Communication with Friends and Family: More than three times a week   Frequency of Social Gatherings with Friends and Family: More than three times a week   Attends Religious Services: More than 4 times per year   Active Member of [employer] or Organizations: No   Attends Archivist Meetings: Never   Marital Status: Never married    Allergies: Allergies  Allergen Reactions   Aspirin Swelling   Elemental Sulfur Nausea  Only    Outpatient Meds: Current Outpatient Medications  Medication Sig Dispense Refill   alum & mag hydroxide-simeth (MAALOX MAX) 400-400-40 MG/5ML suspension Take 5 mLs by mouth every 6 (six) hours as needed for indigestion. 355 mL 0   cholecalciferol (VITAMIN D) 1000 units tablet Take 1,000 Units by mouth daily.     clobetasol cream (TEMOVATE) 3.22 % Apply 1 application topically 2 (two) times daily. 45 g 1   famotidine (PEPCID) 20 MG tablet Take 1 tablet (20 mg total) by mouth 2 (two) times daily. 30 tablet 0   lisinopril-hydrochlorothiazide (PRINZIDE,ZESTORETIC) 10-12.5 MG per tablet Take 1 tablet by mouth daily. 30 tablet 2   Na Sulfate-K Sulfate-Mg Sulf 17.5-3.13-1.6 GM/177ML SOLN Take 1 kit by mouth once for 1 dose. [place]  mL 0   omeprazole (PRILOSEC) 20 MG capsule Take 1 capsule (20 mg total) by mouth daily. 30 capsule 6   polyethylene glycol powder (GLYCOLAX/MIRALAX) 17 GM/SCOOP powder Use 17 grams once a day 255 g 3   PROVENTIL HFA 108 (90 Base) MCG/ACT inhaler Inhale 2 puffs into the lungs every 4 (four) hours as needed for wheezing or shortness of breath.   3   sucralfate (CARAFATE) 1 GM/10ML suspension Take 10 mLs (1 g total) by mouth 4 (four) times daily -  with meals and at bedtime. 420 mL 0   SYNTHROID 100 MCG tablet Take 1 tablet (100 mcg total) by mouth daily before breakfast. 90 tablet 3   No current facility-administered medications for this visit.   Facility-Administered Medications Ordered in Other Visits  Medication Dose Route Frequency Provider Last Rate Last Admin   Alcohol (DEHYDRATED ALCOHOL 98%) 98 % injection SOLN 5 mL  5 mL Intravenous Once Docia Barrier, PA          ___________________________________________________________________ Objective   Exam:  BP 102/60 (BP Location: Right Arm, Patient Position: Sitting, Cuff Size: Normal)    Pulse 84    Ht 5' 3.25" (1.607 m) Comment: height measured without shoes   Wt 197 lb 4 oz (89.5 kg)    BMI 34.67  kg/m  Wt Readings from Last 3 Encounters:  07/05/21 197 lb 4 oz (89.5 kg)  06/24/21 210 lb (95.3 kg)  05/11/21 203 lb (92.1 kg)   Well-appearing, gets on exam table without assistance.  Eyes: sclera anicteric, no redness ENT: oral mucosa moist without lesions, no cervical or supraclavicular lymphadenopathy.  Dysphonia, breathing comfortably on room air CV: RRR without murmur, S1/S2, no JVD, no peripheral edema Resp: clear to auscultation bilaterally, normal RR and effort noted GI: soft, mild epigastric tenderness, with active bowel sounds. No guarding or palpable organomegaly noted, limited by body habitus Skin; warm and dry, no rash or jaundice noted Neuro: awake, alert and oriented x 3. Normal gross motor function and fluent speech   Radiologic Studies:  No abdominal imaging on file since 2008  Assessment: Encounter Diagnoses  Name Primary?   Generalized abdominal pain Yes   Abdominal bloating    Eructation    Personal history of colonic polyps    Heartburn    Chronic constipation     Fairly generalized abdominal discomfort, mostly mid to lower with associated bloating, likely related to constipation and functional issues, perhaps some maldigestion. Written dietary advice given, daily MiraLAX advised.  While she has occasional heartburn, or other symptoms of bloating and belching sound likely functional.  Reported throat discomfort may be reflux related, but also possibly cricopharyngeal dysfunction in a patient with vocal cord paralysis.  History colon polyps, due for surveillance.  Plan:  Upper endoscopy and colonoscopy.  She was agreeable after discussion of procedure and risks.  The benefits and risks of the planned procedure were described in detail with the patient or (when appropriate) their health care proxy.  Risks were outlined as including, but not limited to, bleeding, infection, perforation, adverse medication reaction leading to cardiac or pulmonary  decompensation, pancreatitis (if ERCP).  The limitation of incomplete mucosal visualization was also discussed.  No guarantees or warranties were given.  Other recommendations as noted above  Thank you for the courtesy of this consult.  Please call me with any questions or concerns.  Nelida Meuse III  CC: Referring provider noted above

## 2021-07-05 NOTE — Patient Instructions (Addendum)
If you are age 60 or older, your body mass index should be between 23-30. Your Body mass index is 34.67 kg/m. If this is out of the aforementioned range listed, please consider follow up with your Primary Care Provider.  If you are age 51 or younger, your body mass index should be between 19-25. Your Body mass index is 34.67 kg/m. If this is out of the aformentioned range listed, please consider follow up with your Primary Care Provider.   ________________________________________________________  The [facility] providers would like to encourage you to use [facility] to communicate with providers for non-urgent requests or questions.  Due to long hold times on the telephone, sending your provider a message by [facility] may be a faster and more efficient way to get a response.  Please allow 48 business hours for a response.  Please remember that this is for non-urgent requests.  _______________________________________________________  Start Miralax, 1 capful once a day.   You have been scheduled for an endoscopy and colonoscopy. Please follow the written instructions given to you at your visit today. Please pick up your prep supplies at the pharmacy within the next 1-3 days. If you use inhalers (even only as needed), please bring them with you on the day of your procedure.   _______________________________________________________  Food Guidelines for those with chronic digestive trouble:  Many people have difficulty digesting certain foods, causing a variety of distressing and embarrassing symptoms such as abdominal pain, bloating and gas.  These foods may need to be avoided or consumed in small amounts.  Here are some tips that might be helpful for you.  1.   Lactose intolerance is the difficulty or complete inability to digest lactose, the natural sugar in milk and anything made from milk.  This condition is harmless, common, and can begin any time during life.  Some people can digest a modest  amount of lactose while others cannot tolerate any.  Also, not all dairy products contain equal amounts of lactose.  For example, hard cheeses such as parmesan have less lactose than soft cheeses such as cheddar.  Yogurt has less lactose than milk or cheese.  Many packaged foods (even many brands of bread) have milk, so read ingredient lists carefully.  It is difficult to test for lactose intolerance, so just try avoiding lactose as much as possible for a week and see what happens with your symptoms.  If you seem to be lactose intolerant, the best plan is to avoid it (but make sure you get calcium from another source).  The next best thing is to use lactase enzyme supplements, available over the counter everywhere.  Just know that many lactose intolerant people need to take several tablets with each serving of dairy to avoid symptoms.  Lastly, a lot of restaurant food is made with milk or butter.  Many are things you might not suspect, such as mashed potatoes, rice and pasta (cooked with butter) and "grilled" items.  If you are lactose intolerant, it never hurts to ask your server what has milk or butter.  2.   Fiber is an important part of your diet, but not all fiber is well-tolerated.  Insoluble fiber such as bran is often consumed by normal gut bacteria and converted into gas.  Soluble fiber such as oats, squash, carrots and green beans are typically tolerated better.  3.   Some types of carbohydrates can be poorly digested.  Examples include: fructose (apples, cherries, pears, raisins and other dried fruits), fructans (  onions, zucchini, large amounts of wheat), sorbitol/mannitol/xylitol and sucralose/Splenda (common artificial sweeteners), and raffinose (lentils, broccoli, cabbage, asparagus, brussel sprouts, many types of beans).  Do a Development worker, community for [employer] and you will find helpful information. Beano, a dietary supplement, will often help with raffinose-containing foods.  As with lactase tablets,  you may need several per serving.  4.   Whenever possible, avoid processed food&meats and chemical additives.  High fructose corn syrup, a common sweetener, may be difficult to digest.  Eggs and soy (comes from the soybean, and added to many foods now) are other common bloating/gassy foods.  5.  Regarding gluten:  gluten is a protein mainly found in wheat, but also rye and barley.  There is a condition called celiac sprue, which is an inflammatory reaction in the small intestine causing a variety of digestive symptoms.  Blood testing is highly reliable to look for this condition, and sometimes upper endoscopy with small bowel biopsies may be necessary to make the diagnosis.  Many patients who test negative for celiac sprue report improvement in their digestive symptoms when they switch to a gluten-free diet.  However, in these "non-celiac gluten sensitive" patients, the true role of gluten in their symptoms is unclear.  Reducing carbohydrates in general may decrease the gas and bloating caused when gut bacteria consume carbs. Also, some of these patients may actually be intolerant of the baker's yeast in bread products rather than the gluten.  Flatbread and other reduced yeast breads might therefore be tolerated.  There is no specific testing available for most food intolerances, which are discovered mainly by dietary elimination.  Please do not embark on a gluten free diet unless directed by your doctor, as it is highly restrictive, and may lead to nutritional deficiencies if not carefully monitored.  Lastly, beware of internet claims offering "personalized" tests for food intolerances.  Such testing has no reliable scientific evidence to support its reliability and correlation to symptoms.    6.  The best advice is old advice, especially for those with chronic digestive trouble - try to eat "clean".  Balanced diet, avoid processed food, plenty of fruits and vegetables, cut down the sugar, minimal alcohol,  avoid tobacco. Make time to care for yourself, get enough sleep, exercise when you can, reduce stress.  Your guts will thank you for it.   - Dr. [facility]  ____________________________________________________________      It was a pleasure to see you today!  Thank you for trusting me with your gastrointestinal care!

## 2021-07-06 ENCOUNTER — Other Ambulatory Visit: Payer: Self-pay

## 2021-07-06 ENCOUNTER — Inpatient Hospital Stay: Payer: Medicaid Other | Attending: Hematology & Oncology

## 2021-07-06 ENCOUNTER — Encounter: Payer: Self-pay | Admitting: Hematology & Oncology

## 2021-07-06 ENCOUNTER — Inpatient Hospital Stay (HOSPITAL_BASED_OUTPATIENT_CLINIC_OR_DEPARTMENT_OTHER): Payer: Medicaid Other | Admitting: Hematology & Oncology

## 2021-07-06 VITALS — BP 123/73 | HR 77 | Temp 98.4°F | Resp 18 | Wt 197.1 lb

## 2021-07-06 DIAGNOSIS — M79622 Pain in left upper arm: Secondary | ICD-10-CM | POA: Insufficient documentation

## 2021-07-06 DIAGNOSIS — Z923 Personal history of irradiation: Secondary | ICD-10-CM | POA: Insufficient documentation

## 2021-07-06 DIAGNOSIS — Z79899 Other long term (current) drug therapy: Secondary | ICD-10-CM | POA: Diagnosis not present

## 2021-07-06 DIAGNOSIS — R059 Cough, unspecified: Secondary | ICD-10-CM

## 2021-07-06 DIAGNOSIS — Z79890 Hormone replacement therapy (postmenopausal): Secondary | ICD-10-CM | POA: Insufficient documentation

## 2021-07-06 DIAGNOSIS — E041 Nontoxic single thyroid nodule: Secondary | ICD-10-CM | POA: Insufficient documentation

## 2021-07-06 DIAGNOSIS — H9202 Otalgia, left ear: Secondary | ICD-10-CM | POA: Insufficient documentation

## 2021-07-06 DIAGNOSIS — Z853 Personal history of malignant neoplasm of breast: Secondary | ICD-10-CM | POA: Insufficient documentation

## 2021-07-06 DIAGNOSIS — C50912 Malignant neoplasm of unspecified site of left female breast: Secondary | ICD-10-CM | POA: Diagnosis not present

## 2021-07-06 DIAGNOSIS — E039 Hypothyroidism, unspecified: Secondary | ICD-10-CM | POA: Insufficient documentation

## 2021-07-06 LAB — CBC WITH DIFFERENTIAL (CANCER CENTER ONLY)
Abs Immature Granulocytes: 0.01 10*3/uL (ref 0.00–0.07)
Basophils Absolute: 0 10*3/uL (ref 0.0–0.1)
Basophils Relative: 0 %
Eosinophils Absolute: 0.1 10*3/uL (ref 0.0–0.5)
Eosinophils Relative: 2 %
HCT: 39.7 % (ref 36.0–46.0)
Hemoglobin: 13.3 g/dL (ref 12.0–15.0)
Immature Granulocytes: 0 %
Lymphocytes Relative: 36 %
Lymphs Abs: 2.1 10*3/uL (ref 0.7–4.0)
MCH: 27.8 pg (ref 26.0–34.0)
MCHC: 33.5 g/dL (ref 30.0–36.0)
MCV: 82.9 fL (ref 80.0–100.0)
Monocytes Absolute: 0.4 10*3/uL (ref 0.1–1.0)
Monocytes Relative: 7 %
Neutro Abs: 3.2 10*3/uL (ref 1.7–7.7)
Neutrophils Relative %: 55 %
Platelet Count: 258 10*3/uL (ref 150–400)
RBC: 4.79 MIL/uL (ref 3.87–5.11)
RDW: 12.9 % (ref 11.5–15.5)
WBC Count: 5.9 10*3/uL (ref 4.0–10.5)
nRBC: 0 % (ref 0.0–0.2)

## 2021-07-06 LAB — CMP (CANCER CENTER ONLY)
ALT: 14 U/L (ref 0–44)
AST: 16 U/L (ref 15–41)
Albumin: 4.7 g/dL (ref 3.5–5.0)
Alkaline Phosphatase: 89 U/L (ref 38–126)
Anion gap: 9 (ref 5–15)
BUN: 16 mg/dL (ref 6–20)
CO2: 28 mmol/L (ref 22–32)
Calcium: 10.1 mg/dL (ref 8.9–10.3)
Chloride: 102 mmol/L (ref 98–111)
Creatinine: 0.96 mg/dL (ref 0.44–1.00)
GFR, Estimated: >60 mL/min (ref >60)
Glucose, Bld: 116 mg/dL — ABNORMAL HIGH (ref 70–99)
Potassium: 4.3 mmol/L (ref 3.5–5.1)
Sodium: 139 mmol/L (ref 135–145)
Total Bilirubin: 0.4 mg/dL (ref 0.3–1.2)
Total Protein: 7.9 g/dL (ref 6.5–8.1)

## 2021-07-06 LAB — LACTATE DEHYDROGENASE: LDH: 160 U/L (ref 98–192)

## 2021-07-06 NOTE — Progress Notes (Signed)
Hematology and Oncology Follow Up Visit  [name] [id number] 1961/02/04 61 y.o. 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  As always, we see her back yearly.  S she is still very cautious about COVID.  She really does not go anywhere.  She just wants to be inside and stay safe.  She is complaining of some pain in the left upper arm.  This is that she had her mastectomy.  There is a little bit of problems with shoulder rotation.  She would like to be seen by Orthopedic Surgery.  We will see about making referral.  To complain about pain in her left ear.  I took a look into the left ear.  Everything looks fine with the ear trauma.  I do not see any fluid or irritation.  I do not think she needs any medication for this.  She went to the emergency room because she thought she had a blood clot in her leg.  This is her left leg.  I looked down that area.  It looks like there is just a tiny subcutaneous nodule which might be a ganglion cyst.  Otherwise, she is doing okay.  She is going have a colonoscopy in March because of a history of polyps.  Her last mammogram was done back in August 2022.  This all look fine.  Overall, there is no change in bowel or bladder habits.  She has had no bleeding.  There is been no leg swelling.  She has had no rashes.  There is been no cough.  She still has this hoarseness..  She is on Synthroid for hypothyroidism.  I would have to say that her performance status is probably ECOG 1.     Medications:  Current Outpatient Medications:    alum & mag hydroxide-simeth (MAALOX MAX) 400-400-40 MG/5ML suspension, Take 5 mLs by mouth every 6 (six) hours as needed for indigestion., Disp: 355 mL, Rfl: 0   cholecalciferol (VITAMIN D) 1000 units tablet, Take 1,000 Units  by mouth daily., Disp: , Rfl:    clobetasol cream (TEMOVATE) 7.82 %, Apply 1 application topically 2 (two) times daily., Disp: 45 g, Rfl: 1   famotidine (PEPCID) 20 MG tablet, Take 1 tablet (20 mg total) by mouth 2 (two) times daily., Disp: 30 tablet, Rfl: 0   lisinopril-hydrochlorothiazide (PRINZIDE,ZESTORETIC) 10-12.5 MG per tablet, Take 1 tablet by mouth daily., Disp: 30 tablet, Rfl: 2   omeprazole (PRILOSEC) 20 MG capsule, Take 1 capsule (20 mg total) by mouth daily., Disp: 30 capsule, Rfl: 6   polyethylene glycol powder (GLYCOLAX/MIRALAX) 17 GM/SCOOP powder, Use 17 grams once a day, Disp: 255 g, Rfl: 3   PROVENTIL HFA 108 (90 Base) MCG/ACT inhaler, Inhale 2 puffs into the lungs every 4 (four) hours as needed for wheezing or shortness of breath. , Disp: , Rfl: 3   sucralfate (CARAFATE) 1 GM/10ML suspension, Take 10 mLs (1 g total) by mouth 4 (four) times daily -  with meals and at bedtime., Disp: 420 mL, Rfl: 0   SYNTHROID 100 MCG tablet, Take 1 tablet (100 mcg total) by mouth daily before breakfast., Disp: 90 tablet, Rfl: 3 No current facility-administered medications for this visit.  Facility-Administered Medications Ordered in Other Visits:    Alcohol (DEHYDRATED ALCOHOL 98%) 98 % injection SOLN 5  mL, 5 mL, Intravenous, Once, Zigmund Daniel Herma Carson, PA  Allergies:  Allergies  Allergen Reactions   Aspirin Swelling   Elemental Sulfur Nausea Only    Past Medical History, Surgical history, Social history, and Family History were reviewed and updated.  Review of Systems: Review of Systems  Constitutional: Negative.   HENT:  Negative.    Eyes: Negative.   Respiratory:  Positive for shortness of breath.   Cardiovascular: Negative.   Gastrointestinal: Negative.   Endocrine: Negative.   Genitourinary: Negative.    Musculoskeletal:  Positive for myalgias.  Skin: Negative.   Neurological: Negative.   Hematological: Negative.   Psychiatric/Behavioral: Negative.     Physical  Exam:  weight is 197 lb 1.9 oz (89.4 kg). Her oral temperature is 98.4 F (36.9 C). Her blood pressure is 123/73 and her pulse is 77. Her respiration is 18 and oxygen saturation is 100%.   Wt Readings from Last 3 Encounters:  07/06/21 197 lb 1.9 oz (89.4 kg)  07/05/21 197 lb 4 oz (89.5 kg)  06/24/21 210 lb (95.3 kg)    Physical Exam Vitals reviewed.  HENT:     Head: Normocephalic and atraumatic.  Eyes:     Pupils: Pupils are equal, round, and reactive to light.  Cardiovascular:     Rate and Rhythm: Normal rate and regular rhythm.     Heart sounds: Normal heart sounds.  Pulmonary:     Effort: Pulmonary effort is normal.     Breath sounds: Normal breath sounds.  Abdominal:     General: Bowel sounds are normal.     Palpations: Abdomen is soft.  Musculoskeletal:        General: No tenderness or deformity. Normal range of motion.     Cervical back: Normal range of motion.  Lymphadenopathy:     Cervical: No cervical adenopathy.  Skin:    General: Skin is warm and dry.     Findings: No erythema or rash.  Neurological:     Mental Status: She is alert and oriented to person, place, and time.  Psychiatric:        Behavior: Behavior normal.        Thought Content: Thought content normal.        Judgment: Judgment normal.     Lab Results  Component Value Date   WBC 5.9 07/06/2021   HGB 13.3 07/06/2021   HCT 39.7 07/06/2021   MCV 82.9 07/06/2021   PLT 258 07/06/2021     Chemistry      Component Value Date/Time   NA 139 07/06/2021 0855   NA 139 12/15/2014 0834   K 4.3 07/06/2021 0855   K 4.1 12/15/2014 0834   CL 102 07/06/2021 0855   CL 101 10/28/2013 1150   CO2 28 07/06/2021 0855   CO2 22 12/15/2014 0834   BUN 16 07/06/2021 0855   BUN 13.0 12/15/2014 0834   CREATININE 0.96 07/06/2021 0855   CREATININE 0.8 12/15/2014 0834      Component Value Date/Time   CALCIUM 10.1 07/06/2021 0855   CALCIUM 9.0 12/15/2014 0834   ALKPHOS 89 07/06/2021 0855   ALKPHOS 81  12/15/2014 0834   AST 16 07/06/2021 0855   AST 16 12/15/2014 0834   ALT 14 07/06/2021 0855   ALT 13 12/15/2014 0834   BILITOT 0.4 07/06/2021 0855   BILITOT 0.31 12/15/2014 0834      Impression and Plan: [name] is a 61 year old postmenopausal African-American female.  She has had a remote history  of recurrent adenocarcinoma of the left breast.  She underwent radiation for this recurrence after surgery.  As far as her breast cancer is concerned, I really doubt that there is a problem with this.    Sounds like the left shoulder might have some arthritis.  Again we will make a referral to orthopedic surgery.  We will go ahead and plan to see her back in another year, as always.     Volanda Napoleon, MD 1/26/202310:59 AM

## 2021-07-07 ENCOUNTER — Telehealth: Payer: Self-pay | Admitting: Gastroenterology

## 2021-07-07 NOTE — Telephone Encounter (Signed)
Patient called to move up her colonoscopy she is now on for 08/15/21 in the afternoon seeking new prep instruction.

## 2021-07-07 NOTE — Telephone Encounter (Signed)
I have sent the new instructions to the patient on mychart.

## 2021-07-08 ENCOUNTER — Encounter (HOSPITAL_BASED_OUTPATIENT_CLINIC_OR_DEPARTMENT_OTHER): Payer: Self-pay | Admitting: Emergency Medicine

## 2021-07-08 ENCOUNTER — Emergency Department (HOSPITAL_BASED_OUTPATIENT_CLINIC_OR_DEPARTMENT_OTHER)
Admission: EM | Admit: 2021-07-08 | Discharge: 2021-07-08 | Disposition: A | Discharge status: Home / Self Care | Payer: Medicaid Other | Attending: Emergency Medicine | Admitting: Emergency Medicine

## 2021-07-08 ENCOUNTER — Other Ambulatory Visit: Payer: Self-pay

## 2021-07-08 ENCOUNTER — Emergency Department (HOSPITAL_BASED_OUTPATIENT_CLINIC_OR_DEPARTMENT_OTHER): Payer: Medicaid Other | Admitting: Radiology

## 2021-07-08 ENCOUNTER — Emergency Department (HOSPITAL_BASED_OUTPATIENT_CLINIC_OR_DEPARTMENT_OTHER): Payer: Medicaid Other

## 2021-07-08 DIAGNOSIS — K219 Gastro-esophageal reflux disease without esophagitis: Secondary | ICD-10-CM

## 2021-07-08 DIAGNOSIS — K2100 Gastro-esophageal reflux disease with esophagitis, without bleeding: Secondary | ICD-10-CM | POA: Insufficient documentation

## 2021-07-08 DIAGNOSIS — Z79899 Other long term (current) drug therapy: Secondary | ICD-10-CM | POA: Diagnosis not present

## 2021-07-08 DIAGNOSIS — Z7951 Long term (current) use of inhaled steroids: Secondary | ICD-10-CM | POA: Diagnosis not present

## 2021-07-08 DIAGNOSIS — R1310 Dysphagia, unspecified: Secondary | ICD-10-CM | POA: Diagnosis present

## 2021-07-08 MED ORDER — ALUM & MAG HYDROXIDE-SIMETH 200-200-20 MG/5ML PO SUSP
30.0000 mL | Freq: Once | ORAL | Status: AC
Start: 2021-07-08 — End: 2021-07-08
  Administered 2021-07-08: 30 mL via ORAL
  Filled 2021-07-08: qty 30

## 2021-07-08 MED ORDER — SUCRALFATE 1 GM/10ML PO SUSP
1.0000 g | Freq: Three times a day (TID) | ORAL | Status: DC
  Administered 2021-07-08: 1 g via ORAL
  Filled 2021-07-08: qty 10

## 2021-07-08 NOTE — ED Provider Notes (Signed)
[facility] EMERGENCY DEPT Provider Note   CSN: [id number] Arrival date & time: 07/08/21  0251     History  Chief Complaint  Patient presents with   Dysphagia    [name] is a 61 y.o. female.  The history is provided by the patient.  Illness Location:  Esophagus Quality:  Jerrye Bushy not controlled Severity:  Moderate Onset quality:  Gradual Duration: months. Timing:  Constant Progression:  Unchanged Chronicity:  Chronic Context:  Her son was here as a patient so she decided she wanted to be seen Relieved by:  Nothing Worsened by:  Nothing Ineffective treatments:  Home regimen Associated symptoms: no abdominal pain, no chest pain, no congestion, no cough, no fever, no loss of consciousness, no sore throat and no vomiting       Home Medications Prior to Admission medications   Medication Sig Start Date End Date Taking? Authorizing Provider  alum & mag hydroxide-simeth (MAALOX MAX) 400-400-40 MG/5ML suspension Take 5 mLs by mouth every 6 (six) hours as needed for indigestion. 05/12/21   Caccavale, Sophia, PA-C  cholecalciferol (VITAMIN D) 1000 units tablet Take 1,000 Units by mouth daily.    [provider]  clobetasol cream (TEMOVATE) 3.78 % Apply 1 application topically 2 (two) times daily. 02/27/21   Estill Dooms, NP  famotidine (PEPCID) 20 MG tablet Take 1 tablet (20 mg total) by mouth 2 (two) times daily. 12/06/20   Valarie Merino, MD  lisinopril-hydrochlorothiazide (PRINZIDE,ZESTORETIC) 10-12.5 MG per tablet Take 1 tablet by mouth daily. 03/01/15   Volanda Napoleon, MD  omeprazole (PRILOSEC) 20 MG capsule Take 1 capsule (20 mg total) by mouth daily. 09/09/15   Volanda Napoleon, MD  polyethylene glycol powder (GLYCOLAX/MIRALAX) 17 GM/SCOOP powder Use 17 grams once a day 07/05/21   Doran Stabler, MD  PROVENTIL HFA 108 [phone] Base) MCG/ACT inhaler Inhale 2 puffs into the lungs every 4 (four) hours as needed for wheezing or shortness of  breath.  04/06/18   [provider]  sucralfate (CARAFATE) 1 GM/10ML suspension Take 10 mLs (1 g total) by mouth 4 (four) times daily -  with meals and at bedtime. 05/12/21   Caccavale, Sophia, PA-C  SYNTHROID 100 MCG tablet Take 1 tablet (100 mcg total) by mouth daily before breakfast. 04/12/21   Philemon Kingdom, MD      Allergies    Aspirin and Elemental sulfur    Review of Systems   Review of Systems  Constitutional:  Negative for fever.  HENT:  Negative for congestion and sore throat.   Eyes:  Negative for redness.  Respiratory:  Negative for cough.   Cardiovascular:  Negative for chest pain.  Gastrointestinal:  Negative for abdominal pain and vomiting.  Genitourinary:  Negative for difficulty urinating.  Neurological:  Negative for loss of consciousness and facial asymmetry.  Psychiatric/Behavioral:  Negative for agitation.   All other systems reviewed and are negative.  Physical Exam Updated Vital Signs BP 126/80    Pulse 72    Temp 97.8 F (36.6 C) (Temporal)    Resp 18    Wt 89.4 kg    SpO2 100%    BMI 34.62 kg/m  Physical Exam Vitals and nursing note reviewed.  Constitutional:      General: She is not in acute distress.    Appearance: Normal appearance.  HENT:     Head: Normocephalic and atraumatic.     Nose: Nose normal.     Mouth/Throat:  Mouth: Mucous membranes are moist.     Pharynx: Oropharynx is clear.  Eyes:     Conjunctiva/sclera: Conjunctivae normal.     Pupils: Pupils are equal, round, and reactive to light.  Cardiovascular:     Rate and Rhythm: Normal rate and regular rhythm.     Pulses: Normal pulses.     Heart sounds: Normal heart sounds.  Pulmonary:     Effort: Pulmonary effort is normal.     Breath sounds: Normal breath sounds.  Abdominal:     General: Abdomen is flat. Bowel sounds are normal.     Palpations: Abdomen is soft.     Tenderness: There is no abdominal tenderness. There is no guarding.  Musculoskeletal:        General:  Normal range of motion.     Cervical back: Normal range of motion and neck supple.  Skin:    General: Skin is warm and dry.     Capillary Refill: Capillary refill takes less than 2 seconds.  Neurological:     General: No focal deficit present.     Mental Status: She is alert and oriented to person, place, and time.  Psychiatric:        Mood and Affect: Mood normal.        Behavior: Behavior normal.    ED Results / Procedures / Treatments   Labs (all labs ordered are listed, but only abnormal results are displayed) Labs Reviewed - No data to display  EKG None  Radiology DG Neck Soft Tissue  Result Date: 07/08/2021 CLINICAL DATA:  Dysphagia. EXAM: NECK SOFT TISSUES - 1+ VIEW COMPARISON:  Similar study of 12/06/2020. FINDINGS: There is no evidence of retropharyngeal soft tissue swelling or epiglottic enlargement. The cervical airway is unremarkable and no radio-opaque foreign body identified. 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 Old superior mediastinal surgical clips left of the midline. IMPRESSION: Negative. Electronically Signed   By: Telford Nab M.D.   On: 07/08/2021 06:00   DG Chest Portable 1 View  Result Date: 07/08/2021 CLINICAL DATA:  Dysphagia for 2+ months. EXAM: PORTABLE CHEST 1 VIEW COMPARISON:  PA and lateral chest 12/06/2020. FINDINGS: The cardiac size is normal. 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 The lungs are clear of infiltrates. No pleural effusion is seen. Mild thoracic levoscoliosis. Stable mediastinal outlines. IMPRESSION: No active disease. Stable chest with postsurgical and chronic change. Electronically Signed   By: Telford Nab M.D.   On: 07/08/2021 05:58    Procedures Procedures    Medications Ordered  in ED Medications  sucralfate (CARAFATE) 1 GM/10ML suspension 1 g (1 g Oral Given 07/08/21 0521)  alum & mag hydroxide-simeth (MAALOX/MYLANTA) 200-200-20 MG/5ML suspension 30 mL (30 mLs Oral Given 07/08/21 9678)    ED Course/ Medical Decision Making/ A&P                           Medical Decision Making Ongoing gerd, decided to check while son was a patient because she can't see GI until March   Amount and/or Complexity of Data Reviewed Radiology: ordered.    Details: no masses and airway is widely patent on Xrays  Risk OTC drugs. Prescription drug management. Risk Details: Airway patent on exam, intact phonation, lungs are  clear. Imaging shows airway is patent there is no compression on esophagus from a mass lesion.  Reflux and sensation of food getting stuck and belching but able to tolerate both solids and liquids needs to be seen by GI.  There is no food impaction at this time and the patient is stable for discharge with close follow up.  Continue home medications for GERD    Final Clinical Impression(s) / ED Diagnoses Final diagnoses:  Gastroesophageal reflux disease, unspecified whether esophagitis present   Return for intractable cough, coughing up blood, fevers > 100.4 unrelieved by medication, shortness of breath, intractable vomiting, chest pain, shortness of breath, weakness, numbness, changes in speech, facial asymmetry, abdominal pain, passing out, Inability to tolerate liquids or food, cough, altered mental status or any concerns. No signs of systemic illness or infection. The patient is nontoxic-appearing on exam and vital signs are within normal limits.  I have reviewed the triage vital signs and the nursing notes. Pertinent labs & imaging results that were available during my care of the patient were reviewed by me and considered in my medical decision making (see chart for details). After history, exam, and medical workup I feel the patient has been appropriately medically  screened and is safe for discharge home. Pertinent diagnoses were discussed with the patient. Patient was given return precautions.             Lavergne Hiltunen, MD 07/08/21 [phone]

## 2021-07-08 NOTE — ED Triage Notes (Signed)
Presents for dysphagia for 2+months. H/o prior episodes, has had esophagus stretched previously, scheduled with GI for endo in March but experiencing worsening of sx. Endorses belching, feeling food is "stuck". Has been taking pepcid, omeprazole.

## 2021-07-12 NOTE — Telephone Encounter (Signed)
Inbound call from patient changed appt to 2/7. States she would like a call back because she is confused on the instructions.

## 2021-07-13 ENCOUNTER — Ambulatory Visit: Payer: Medicaid Other | Admitting: Internal Medicine

## 2021-07-14 NOTE — Telephone Encounter (Signed)
New instructions for colonoscopy on 07-18-2021 have been sent by my chart. Pt is aware.

## 2021-07-17 ENCOUNTER — Encounter: Payer: Self-pay | Admitting: Certified Registered Nurse Anesthetist

## 2021-07-18 ENCOUNTER — Encounter: Payer: Self-pay | Admitting: Gastroenterology

## 2021-07-18 ENCOUNTER — Ambulatory Visit (AMBULATORY_SURGERY_CENTER): Payer: Medicaid Other | Admitting: Gastroenterology

## 2021-07-18 VITALS — BP 152/76 | HR 61 | Temp 98.2°F | Resp 15 | Ht 63.25 in | Wt 197.0 lb

## 2021-07-18 DIAGNOSIS — R1314 Dysphagia, pharyngoesophageal phase: Secondary | ICD-10-CM | POA: Diagnosis not present

## 2021-07-18 DIAGNOSIS — B9681 Helicobacter pylori [H. pylori] as the cause of diseases classified elsewhere: Secondary | ICD-10-CM

## 2021-07-18 DIAGNOSIS — D122 Benign neoplasm of ascending colon: Secondary | ICD-10-CM | POA: Diagnosis not present

## 2021-07-18 DIAGNOSIS — G8929 Other chronic pain: Secondary | ICD-10-CM

## 2021-07-18 DIAGNOSIS — Z86010 Personal history of colonic polyps: Secondary | ICD-10-CM | POA: Diagnosis not present

## 2021-07-18 DIAGNOSIS — K2970 Gastritis, unspecified, without bleeding: Secondary | ICD-10-CM

## 2021-07-18 DIAGNOSIS — K2950 Unspecified chronic gastritis without bleeding: Secondary | ICD-10-CM | POA: Diagnosis not present

## 2021-07-18 MED ORDER — SODIUM CHLORIDE 0.9 % IV SOLN: 500.0000 mL | Freq: Once | INTRAVENOUS | Status: DC

## 2021-07-18 NOTE — Progress Notes (Signed)
1340 Robinul 0.1 mg IV given due large amount of secretions upon assessment.  MD made aware, vss 

## 2021-07-18 NOTE — Progress Notes (Signed)
No changes to clinical history since GI office visit on 07/05/21.  The patient is appropriate for an endoscopic procedure in the ambulatory setting.

## 2021-07-18 NOTE — Patient Instructions (Addendum)
Thank you for allowing [place] to care for you today! Await biopsy results, approximately 2 weeks. Recommend next surveillance colonoscopy in 5 years. Resume previous diet and medications today. Return to normal daily activities tomorrow. Nurse to contact you to set up appointment at hospital to perform barium swallow.   YOU HAD AN ENDOSCOPIC PROCEDURE TODAY AT [facility]:   Refer to the procedure report that was given to you for any specific questions about what was found during the examination.  If the procedure report does not answer your questions, please call your gastroenterologist to clarify.  If you requested that your care partner not be given the details of your procedure findings, then the procedure report has been included in a sealed envelope for you to review at your convenience later.  YOU SHOULD EXPECT: Some feelings of bloating in the abdomen. Passage of more gas than usual.  Walking can help get rid of the air that was put into your GI tract during the procedure and reduce the bloating. If you had a lower endoscopy (such as a colonoscopy or flexible sigmoidoscopy) you may notice spotting of blood in your stool or on the toilet paper. If you underwent a bowel prep for your procedure, you may not have a normal bowel movement for a few days.  Please Note:  You might notice some irritation and congestion in your nose or some drainage.  This is from the oxygen used during your procedure.  There is no need for concern and it should clear up in a day or so.  SYMPTOMS TO REPORT IMMEDIATELY:  Following lower endoscopy (colonoscopy or flexible sigmoidoscopy):  Excessive amounts of blood in the stool  Significant tenderness or worsening of abdominal pains  Swelling of the abdomen that is new, acute  Fever of 100F or higher  Following upper endoscopy (EGD)  Vomiting of blood or coffee ground material  New chest pain or pain under the shoulder blades  Painful or  persistently difficult swallowing  New shortness of breath  Fever of 100F or higher  Black, tarry-looking stools  For urgent or emergent issues, a gastroenterologist can be reached at any hour by calling [phone]. Do not use MyChart messaging for urgent concerns.    DIET:  We do recommend a small meal at first, but then you may proceed to your regular diet.  Drink plenty of fluids but you should avoid alcoholic beverages for 24 hours.  ACTIVITY:  You should plan to take it easy for the rest of today and you should NOT DRIVE or use heavy machinery until tomorrow (because of the sedation medicines used during the test).    FOLLOW UP: Our staff will call the number listed on your records 48-72 hours following your procedure to check on you and address any questions or concerns that you may have regarding the information given to you following your procedure. If we do not reach you, we will leave a message.  We will attempt to reach you two times.  During this call, we will ask if you have developed any symptoms of COVID 19. If you develop any symptoms (ie: fever, flu-like symptoms, shortness of breath, cough etc.) before then, please call [phone].  If you test positive for Covid 19 in the 2 weeks post procedure, please call and report this information to [place].    If any biopsies were taken you will be contacted by phone or by letter within the next 1-3 weeks.  Please call  us at [phone] if you have not heard about the biopsies in 3 weeks.    SIGNATURES/CONFIDENTIALITY: You and/or your care partner have signed paperwork which will be entered into your electronic medical record.  These signatures attest to the fact that that the information above on your After Visit Summary has been reviewed and is understood.  Full responsibility of the confidentiality of this discharge information lies with you and/or your care-partner.

## 2021-07-18 NOTE — Op Note (Signed)
[facility] Patient Name: [name] Procedure Date: 07/18/2021 12:39 PM MRN: [id number] Endoscopist: Atkinson. Loletha Carrow , MD Age: 61 Referring MD:  Date of Birth: [birth year] Gender: Female Account #: [id number] Procedure:                Upper GI endoscopy Indications:              Epigastric abdominal pain, Pharyngeal/esophageal                            phase dysphagia,globus sensation, Esophageal reflux                            symptoms that persist despite appropriate therapy,                            Abdominal bloating Medicines:                Monitored Anesthesia Care Procedure:                Pre-Anesthesia Assessment:                           - Prior to the procedure, a History and Physical                            was performed, and patient medications and                            allergies were reviewed. The patient's tolerance of                            previous anesthesia was also reviewed. The risks                            and benefits of the procedure and the sedation                            options and risks were discussed with the patient.                            All questions were answered, and informed consent                            was obtained. Prior Anticoagulants: The patient has                            taken no previous anticoagulant or antiplatelet                            agents. ASA Grade Assessment: II - A patient with                            mild systemic disease. After reviewing the risks  and benefits, the patient was deemed in                            satisfactory condition to undergo the procedure.                           - Prior to the procedure, a History and Physical                            was performed, and patient medications and                            allergies were reviewed. The patient's tolerance of                            previous anesthesia was also  reviewed. The risks                            and benefits of the procedure and the sedation                            options and risks were discussed with the patient.                            All questions were answered, and informed consent                            was obtained. Prior Anticoagulants: The patient has                            taken no previous anticoagulant or antiplatelet                            agents. ASA Grade Assessment: II - A patient with                            mild systemic disease. After reviewing the risks                            and benefits, the patient was deemed in                            satisfactory condition to undergo the procedure.                           After obtaining informed consent, the endoscope was                            passed under direct vision. Throughout the                            procedure, the patient's blood pressure, pulse, and  oxygen saturations were monitored continuously. The                            GIF [id number] #[id number] was introduced through the                            mouth, and advanced to the second part of duodenum.                            The upper GI endoscopy was accomplished without                            difficulty. The patient tolerated the procedure                            well. Scope In: Scope Out: Findings:                 There is no endoscopic evidence of Barrett's                            esophagus, esophagitis or stricture in the entire                            esophagus.                           Atrophic mucosa was found in the gastric body.                            Several biopsies were obtained in the gastric body                            and in the gastric antrum with cold forceps for                            histology.                           The exam of the stomach was otherwise normal,                            including  on retroflexion.                           The examined duodenum was normal. Complications:            No immediate complications. Estimated Blood Loss:     Estimated blood loss was minimal. Impression:               - Gastric mucosal atrophy.                           - Normal examined duodenum.                           - Several biopsies were obtained in the gastric  body and in the gastric antrum.                           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 Recommendation:           - Patient has a contact number available for                            emergencies. The signs and symptoms of potential                            delayed complications were discussed with the                            patient. Return to normal activities tomorrow.                            Written discharge instructions were provided to the                            patient.                           - Resume previous diet.                           - Continue present medications.                           - Await pathology results.                           - See the other procedure note for documentation of                            additional recommendations.                           - Modified barium study                           Esophageal manometry and pH/impedance testing on                            PPI with office follow up afterward. Tramane Gorum L. Loletha Carrow, MD 07/18/2021 2:22:40 PM This report has been signed electronically.

## 2021-07-18 NOTE — Progress Notes (Signed)
Called to room to assist during endoscopic procedure.  Patient ID and intended procedure confirmed with present staff. Received instructions for my participation in the procedure from the performing physician.  

## 2021-07-18 NOTE — Op Note (Signed)
[facility] Patient Name: Judaea Burgoon Procedure Date: 07/18/2021 12:39 PM MRN: [id number] Endoscopist: Evarts. Loletha Carrow , MD Age: 61 Referring MD:  Date of Birth: 1960-11-17 Gender: Female Account #: [id number] Procedure:                Colonoscopy Indications:              Surveillance: Personal history of adenomatous                            polyps on last colonoscopy 3 years ago                           TA x 2 (one > 21mm) last colonoscopy Jan 2020 Medicines:                Monitored Anesthesia Care Procedure:                Pre-Anesthesia Assessment:                           - Prior to the procedure, a History and Physical                            was performed, and patient medications and                            allergies were reviewed. The patient's tolerance of                            previous anesthesia was also reviewed. The risks                            and benefits of the procedure and the sedation                            options and risks were discussed with the patient.                            All questions were answered, and informed consent                            was obtained. Prior Anticoagulants: The patient has                            taken no previous anticoagulant or antiplatelet                            agents. ASA Grade Assessment: II - A patient with                            mild systemic disease. After reviewing the risks                            and benefits, the patient was deemed in  satisfactory condition to undergo the procedure.                           After obtaining informed consent, the colonoscope                            was passed under direct vision. Throughout the                            procedure, the patient's blood pressure, pulse, and                            oxygen saturations were monitored continuously. The                            Olympus CF-HQ190L (Serial#  [id number]) Colonoscope was                            introduced through the anus and advanced to the the                            cecum, identified by appendiceal orifice and                            ileocecal valve. The colonoscopy was performed                            without difficulty. The patient tolerated the                            procedure well. The quality of the bowel                            preparation was excellent. The ileocecal valve,                            appendiceal orifice, and rectum were photographed.                            The bowel preparation used was SUPREP. Scope In: 1:47:14 PM Scope Out: 2:00:46 PM Scope Withdrawal Time: 0 hours 10 minutes 26 seconds  Total Procedure Duration: 0 hours 13 minutes 32 seconds  Findings:                 The perianal and digital rectal examinations were                            normal.                           Repeat examination of right colon under NBI                            performed.  A diminutive polyp was found in the ascending                            colon. The polyp was sessile. The polyp was removed                            with a cold snare. Resection and retrieval were                            complete.                           Multiple diverticula were found in the left colon.                           Internal hemorrhoids were found. The hemorrhoids                            were small.                           The exam was otherwise without abnormality on                            direct and retroflexion views.                           The entire examined colon appeared normal. Complications:            No immediate complications. Estimated Blood Loss:     Estimated blood loss was minimal. Impression:               - One diminutive polyp in the ascending colon,                            removed with a cold snare. Resected and retrieved.                            - Diverticulosis in the left colon.                           - Internal hemorrhoids.                           - The examination was otherwise normal on direct                            and retroflexion views.                           - The entire examined colon is normal. Recommendation:           - Patient has a contact number available for                            emergencies. The signs and symptoms of potential  delayed complications were discussed with the                            patient. Return to normal activities tomorrow.                            Written discharge instructions were provided to the                            patient.                           - Resume previous diet.                           - Continue present medications.                           - Await pathology results.                           - Repeat colonoscopy in 5 years for surveillance.                           - See the other procedure note for documentation of                            additional recommendations. Samaiyah Howes L. Loletha Carrow, MD 07/18/2021 2:12:55 PM This report has been signed electronically.

## 2021-07-18 NOTE — Progress Notes (Signed)
Report given to PACU, vss 

## 2021-07-19 ENCOUNTER — Telehealth: Payer: Self-pay

## 2021-07-19 ENCOUNTER — Other Ambulatory Visit: Payer: Self-pay

## 2021-07-19 DIAGNOSIS — R12 Heartburn: Secondary | ICD-10-CM

## 2021-07-19 DIAGNOSIS — R0989 Other specified symptoms and signs involving the circulatory and respiratory systems: Secondary | ICD-10-CM

## 2021-07-19 DIAGNOSIS — R1314 Dysphagia, pharyngoesophageal phase: Secondary | ICD-10-CM

## 2021-07-19 NOTE — Telephone Encounter (Signed)
Per 07/18/21 procedure report: - Modified barium swallow study - Esophageal manometry and pH/impedance testing on PPI with office f/u after   Patient has been scheduled for a esophageal manometry with ph impedance study at [facility] on Wednesday, 10/25/21 at 8:30 am. This is the next available appt.  Left message for speech pathology to give me a call back to schedule MBSS at [facility].

## 2021-07-20 ENCOUNTER — Other Ambulatory Visit (HOSPITAL_COMMUNITY): Payer: Self-pay | Admitting: *Deleted

## 2021-07-20 ENCOUNTER — Telehealth: Payer: Self-pay

## 2021-07-20 DIAGNOSIS — R1310 Dysphagia, unspecified: Secondary | ICD-10-CM

## 2021-07-20 NOTE — Telephone Encounter (Signed)
°  Follow up Call-  Call back number 07/18/2021  Post procedure Call Back phone  # [phone]  Permission to leave phone message No  Some recent data might be hidden     Post procedure call attempted, no answer.

## 2021-07-20 NOTE — Telephone Encounter (Signed)
°  Follow up Call-  Call back number 07/18/2021  Post procedure Call Back phone  # [phone]  Permission to leave phone message No  Some recent data might be hidden     Patient questions:  Do you have a fever, pain , or abdominal swelling? No. Pain Score  0 *  Have you tolerated food without any problems? Yes.    Have you been able to return to your normal activities? Yes.    Do you have any questions about your discharge instructions: Diet   No. Medications  No. Follow up visit  No.  Do you have questions or concerns about your Care? No.  Actions: * If pain score is 4 or above: No action needed, pain <4.

## 2021-07-20 NOTE — Telephone Encounter (Addendum)
Called speech pathology and spoke with Suanne Marker, she states that she sees the order and will contact pt today to schedule her MBSS.   Called and spoke with patient regarding her esophageal manometry appt. She is aware that I will mail instructions to her, she confirmed her address on file. Pt is aware that she will need an appt after esophageal manometry appt but we do not have the schedules out that far so she will receive a letter when it is time for her to schedule. Patient verbalized understanding and had no concerns at the end of the call.   42-month office recall in epic.

## 2021-07-20 NOTE — Telephone Encounter (Signed)
Noted, thanks!

## 2021-07-20 NOTE — Telephone Encounter (Signed)
Thanks for the update.    It will have to wait as long as endoscopy-related staffing shortages require.  - HD

## 2021-07-21 ENCOUNTER — Encounter: Payer: Self-pay | Admitting: Gastroenterology

## 2021-07-27 ENCOUNTER — Other Ambulatory Visit: Payer: Self-pay

## 2021-07-28 ENCOUNTER — Telehealth: Payer: Self-pay | Admitting: Gastroenterology

## 2021-07-28 ENCOUNTER — Other Ambulatory Visit: Payer: Self-pay

## 2021-07-28 MED ORDER — METRONIDAZOLE 500 MG PO TABS: 500.0000 mg | ORAL_TABLET | Freq: Three times a day (TID) | ORAL | 0 refills | Status: AC

## 2021-07-28 MED ORDER — DOXYCYCLINE HYCLATE 100 MG PO CAPS
100.0000 mg | ORAL_CAPSULE | Freq: Two times a day (BID) | ORAL | 0 refills | Status: AC
Start: 2021-07-28 — End: 2021-08-11

## 2021-07-28 NOTE — Telephone Encounter (Signed)
The pt has been advised that we have sent the abx per Dr Loletha Carrow instructions to her pharmacy.  See 2/16 results note

## 2021-07-28 NOTE — Telephone Encounter (Signed)
Inbound call from patient stated that some medications were supposed to have been sent to her pharmacy and she went to pick them up yesterday and they did not have them. Patient does not know the names of the medications. Please advise.

## 2021-07-31 ENCOUNTER — Telehealth: Payer: Self-pay | Admitting: Gastroenterology

## 2021-07-31 NOTE — Telephone Encounter (Signed)
Inbound call from patient would like a call back to discuss medication. States after takes doxycycline it causes her to cough for up to an hour and experience dry throat.

## 2021-07-31 NOTE — Telephone Encounter (Signed)
Please take the medicine as directed, as H. pylori bacteria only responds to a small number of antibiotics.  I recommend she get some Sucrets or similar lozenges or throat spray to numb the back of the throat a bit and relieve the symptoms as often as needed during the day.  Take the medicine with plenty of liquids.  HD

## 2021-07-31 NOTE — Telephone Encounter (Signed)
Spoke with pt. Gave pt recommendations. Pt verbalized understanding and had no other concerns at end of call.

## 2021-07-31 NOTE — Telephone Encounter (Signed)
Pt reports that the cough and dry throat only happen when she takes the doxycycline not the flagyl. Pt states she has taken doxycycline before in the past and has never had this reaction. Pt also states that she took the tablets last time not the capsules. Pt states that she has no other symptoms, just the cough, dry throat and hoarseness. Please advise.

## 2021-08-02 ENCOUNTER — Ambulatory Visit (HOSPITAL_COMMUNITY)
Admission: RE | Admit: 2021-08-02 | Discharge: 2021-08-02 | Disposition: A | Discharge status: Home / Self Care | Payer: Medicaid Other | Source: Ambulatory Visit | Attending: Gastroenterology | Admitting: Gastroenterology

## 2021-08-02 ENCOUNTER — Other Ambulatory Visit: Payer: Self-pay

## 2021-08-02 DIAGNOSIS — R1314 Dysphagia, pharyngoesophageal phase: Secondary | ICD-10-CM | POA: Insufficient documentation

## 2021-08-02 DIAGNOSIS — R0989 Other specified symptoms and signs involving the circulatory and respiratory systems: Secondary | ICD-10-CM | POA: Diagnosis present

## 2021-08-02 DIAGNOSIS — R1310 Dysphagia, unspecified: Secondary | ICD-10-CM | POA: Insufficient documentation

## 2021-08-02 DIAGNOSIS — R12 Heartburn: Secondary | ICD-10-CM

## 2021-08-15 ENCOUNTER — Encounter: Payer: Medicaid Other | Admitting: Gastroenterology

## 2021-08-29 ENCOUNTER — Encounter: Payer: Medicaid Other | Admitting: Gastroenterology

## 2021-08-30 ENCOUNTER — Telehealth: Payer: Self-pay

## 2021-08-30 DIAGNOSIS — A048 Other specified bacterial intestinal infections: Secondary | ICD-10-CM

## 2021-08-30 NOTE — Telephone Encounter (Signed)
-----   Message from Yevette Edwards, RN sent at 07/27/2021  7:56 AM EST ----- ?Regarding: PPI Hold ?Hold Pantoprazole starting today. ?Urea breath test due on/after 09/04/21 ? ?

## 2021-08-30 NOTE — Telephone Encounter (Signed)
Letter mailed to patient with lab reminder. I have also attached hand written order for Urea breath test and location of [facility].  ?

## 2021-09-11 ENCOUNTER — Telehealth: Payer: Self-pay | Admitting: Gastroenterology

## 2021-09-11 NOTE — Telephone Encounter (Signed)
Inbound call from patient with questions about why she have another procedure scheduled for 10/25/2021 and she had the same procedure done 07/2021 ?

## 2021-09-11 NOTE — Telephone Encounter (Signed)
Attempted to reach pt twice, her vm is not set up. Unable to leave a vm at this time. ?

## 2021-09-11 NOTE — Telephone Encounter (Signed)
Pt returned call, I told her that the esophageal manometry and pH impedance study were ordered to further assess her symptoms (reflux, dysphagia, globus sensation). I was reviewing what the pH impedance test consisted of and the patient stated that she "did not want to walk around with anything in her nose". Pt reports that her symptoms have improved. I told her that if her symptoms have improved she may not need the study but we will check with Dr. Loletha Carrow. She is no longer taking Protonix and Pepcid. Pt reports that she saw ENT after her EGD and they prescribed Omeprazole 40 mg daily but that made her feel bloated. Pt reports that after that she saw her PCP who suggested that she take Omeprazole 20 mg daily instead. Pt is aware that we will be in touch in a few days as Dr. Loletha Carrow is out of the office at this time. Pt verbalized understanding and had no concerns at the end of the call. ?

## 2021-09-12 ENCOUNTER — Ambulatory Visit: Payer: Medicaid Other | Admitting: Gastroenterology

## 2021-09-12 NOTE — Telephone Encounter (Signed)
Thank you for the note. ? ?We did discuss the nature and technical aspects of those procedures after her EGD, but perhaps I did not do a sufficient job explaining it. ? ?If she is feeling better on her current medicine regimen, then she can certainly pass on those studies for now and see me in the office for follow-up. ? ?- HD ?

## 2021-09-12 NOTE — Telephone Encounter (Signed)
Called and spoke with patient regarding Dr. Loletha Carrow recommendations. Pt is aware that I have cancelled her esophageal manometry/Ph impedance study in May. Pt will keep her f/u appt as scheduled with Vicie Mutters, PA-C on Friday, 09/29/21 at 10:30 am. Pt verbalized understanding and had no concerns at the end of the call. ?

## 2021-09-28 NOTE — Progress Notes (Deleted)
09/28/2021 [name] [id number] 1960/07/07  Referring provider: Caryl Bis, MD Primary GI doctor: Dr. Loletha Carrow  ASSESSMENT AND PLAN:   There are no diagnoses linked to this encounter.  ASSESSMENT: No diagnosis found.   PLAN:  No orders of the defined types were placed in this encounter.    History of Present Illness:  61 y.o. female  with a past medical history of thymectomy 1997, vocal cord paralysis, LPR/GERD, history of adenomatous polyps, chronic constipation and others listed below, returns to clinic today for evaluation of H. pylori, dysphagia, abdominal pain. Marland Kitchen 07/05/2021 office visit with Dr. Loletha Carrow thought possibly related to constipation started on MiraLAX, scheduled for colonoscopy endoscopy. 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  But did not do bismuth secondary to possible aspirin cross-reaction otherwise normal. Excellent bowel prep, internal hemorrhoids, diverticulosis, 1 diminutive tubular adenomatous polyp, recall 5 years.  Patient had modified barium swallow 08/02/2021 without any aspiration or penetration/retention noted.  Patient did have some clearing of her throat prior to exam related to her cord paralysis.  Possible mild delay through GE with outpatient sensation but passed easily with liquid swallows.  Current Medications:   Current Outpatient Medications (Endocrine & Metabolic):    SYNTHROID 222 MCG tablet, Take 1 tablet (100 mcg total) by mouth daily before breakfast.   Current Outpatient Medications (Cardiovascular):    lisinopril-hydrochlorothiazide (PRINZIDE,ZESTORETIC) 10-12.5 MG per tablet, Take 1 tablet by mouth daily.   Current Outpatient Medications (Respiratory):    PROVENTIL HFA 108 (90 Base) MCG/ACT inhaler, Inhale 2 puffs into the lungs every 4 (four) hours as needed for wheezing or shortness of breath.        Current Outpatient Medications  (Other):    alum & mag hydroxide-simeth (MAALOX MAX) 400-400-40 MG/5ML suspension, Take 5 mLs by mouth every 6 (six) hours as needed for indigestion.   cholecalciferol (VITAMIN D) 1000 units tablet, Take 1,000 Units by mouth daily.   clobetasol cream (TEMOVATE) 9.79 %, Apply 1 application topically 2 (two) times daily.   famotidine (PEPCID) 20 MG tablet, Take 1 tablet (20 mg total) by mouth 2 (two) times daily.   pantoprazole (PROTONIX) 40 MG tablet, Take 40 mg by mouth 2 (two) times daily.   polyethylene glycol powder (GLYCOLAX/MIRALAX) 17 GM/SCOOP powder, Use 17 grams once a day   sucralfate (CARAFATE) 1 GM/10ML suspension, Take 10 mLs (1 g total) by mouth 4 (four) times daily -  with meals and at bedtime.   Facility-Administered Medications Ordered in Other Visits (Other):    Alcohol (DEHYDRATED ALCOHOL 98%) 98 % injection SOLN 5 mL No current facility-administered medications for this visit.  Surgical History:  She  has a past surgical history that includes Abdominal hysterectomy; Esophageal dilation (2006?); Mass excision (1995); Mastectomy (Left, 2007); and Mastectomy. Family History:  Her family history includes Asthma in her mother; Breast cancer in her paternal aunt; Diabetes in her father; Stomach cancer in her paternal aunt; Thyroid disease in her sister. Social History:   reports that she has never smoked. She has never used smokeless tobacco. She reports that she does not drink alcohol and does not use drugs.  Current Medications, Allergies, Past Medical History, Past Surgical History, Family History and Social History were reviewed in [employer] record.  Physical Exam: There were no vitals taken for this visit. General:   Pleasant, well developed female in no acute distress Heart:  Regular rate and rhythm; no  murmurs Pulm: Clear anteriorly; no wheezing Abdomen:  {BlankSingle:19197::"Distended","Ridged","Soft"},  {BlankSingle:19197::"Flat","Obese","Non-distended"} AB, {BlankSingle:19197::"Absent","Hyperactive, tinkling","Hypoactive","Sluggish","Active"} bowel sounds. {actendernessAB:27319} tenderness {anatomy; site abdomen:5010}. {BlankMultiple:19196::"Without guarding","With guarding","Without rebound","With rebound"}, No organomegaly appreciated. Extremities:  {With/without:5700}  edema. Neurologic:  Alert and  oriented x4;  No focal deficits.  Psych:  Cooperative. Normal mood and affect.   Vladimir Crofts, PA-C 09/28/21

## 2021-09-29 ENCOUNTER — Telehealth: Payer: Self-pay | Admitting: Physician Assistant

## 2021-09-29 ENCOUNTER — Ambulatory Visit: Payer: Medicaid Other | Admitting: Physician Assistant

## 2021-09-29 DIAGNOSIS — Z86010 Personal history of colonic polyps: Secondary | ICD-10-CM

## 2021-09-29 DIAGNOSIS — B9681 Helicobacter pylori [H. pylori] as the cause of diseases classified elsewhere: Secondary | ICD-10-CM

## 2021-09-29 DIAGNOSIS — K5909 Other constipation: Secondary | ICD-10-CM

## 2021-09-29 NOTE — Telephone Encounter (Signed)
Good Morning [name], ? ?Patient called to cancel her appointment with you today at 10:30 due to hardly having a voice. ? ? ?Patient was rescheduled for 5/11 at 10:30.  ?

## 2021-10-18 NOTE — Progress Notes (Deleted)
10/18/2021 [name] [id number] 03-02-1961  Referring provider: Caryl Bis, MD Primary GI doctor: Dr. Loletha Carrow  ASSESSMENT AND PLAN:   There are no diagnoses linked to this encounter.  ASSESSMENT: No diagnosis found.   PLAN:  No orders of the defined types were placed in this encounter.    History of Present Illness:  61 y.o. female  with a past medical history of thymectomy 1997, vocal cord paralysis, LPR/GERD, history of adenomatous polyps, chronic constipation and others listed below, returns to clinic today for evaluation of H. pylori, dysphagia, abdominal pain. Marland Kitchen 07/05/2021 office visit with Dr. Loletha Carrow thought possibly related to constipation started on MiraLAX, scheduled for colonoscopy endoscopy. 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  But did not do bismuth secondary to possible aspirin cross-reaction otherwise normal. Excellent bowel prep, internal hemorrhoids, diverticulosis, 1 diminutive tubular adenomatous polyp, recall 5 years.  Patient had modified barium swallow 08/02/2021 without any aspiration or penetration/retention noted.  Patient did have some clearing of her throat prior to exam related to her cord paralysis.  Possible mild delay through GE with outpatient sensation but passed easily with liquid swallows.  Current Medications:   Current Outpatient Medications (Endocrine & Metabolic):    SYNTHROID 409 MCG tablet, Take 1 tablet (100 mcg total) by mouth daily before breakfast.   Current Outpatient Medications (Cardiovascular):    lisinopril-hydrochlorothiazide (PRINZIDE,ZESTORETIC) 10-12.5 MG per tablet, Take 1 tablet by mouth daily.   Current Outpatient Medications (Respiratory):    PROVENTIL HFA 108 (90 Base) MCG/ACT inhaler, Inhale 2 puffs into the lungs every 4 (four) hours as needed for wheezing or shortness of breath.        Current Outpatient Medications  (Other):    alum & mag hydroxide-simeth (MAALOX MAX) 400-400-40 MG/5ML suspension, Take 5 mLs by mouth every 6 (six) hours as needed for indigestion.   cholecalciferol (VITAMIN D) 1000 units tablet, Take 1,000 Units by mouth daily.   clobetasol cream (TEMOVATE) 8.11 %, Apply 1 application topically 2 (two) times daily.   famotidine (PEPCID) 20 MG tablet, Take 1 tablet (20 mg total) by mouth 2 (two) times daily.   pantoprazole (PROTONIX) 40 MG tablet, Take 40 mg by mouth 2 (two) times daily.   polyethylene glycol powder (GLYCOLAX/MIRALAX) 17 GM/SCOOP powder, Use 17 grams once a day   sucralfate (CARAFATE) 1 GM/10ML suspension, Take 10 mLs (1 g total) by mouth 4 (four) times daily -  with meals and at bedtime.   Facility-Administered Medications Ordered in Other Visits (Other):    Alcohol (DEHYDRATED ALCOHOL 98%) 98 % injection SOLN 5 mL No current facility-administered medications for this visit.  Surgical History:  She  has a past surgical history that includes Abdominal hysterectomy; Esophageal dilation (2006?); Mass excision (1995); Mastectomy (Left, 2007); and Mastectomy. Family History:  Her family history includes Asthma in her mother; Breast cancer in her paternal aunt; Diabetes in her father; Stomach cancer in her paternal aunt; Thyroid disease in her sister. Social History:   reports that she has never smoked. She has never used smokeless tobacco. She reports that she does not drink alcohol and does not use drugs.  Current Medications, Allergies, Past Medical History, Past Surgical History, Family History and Social History were reviewed in [employer] record.  Physical Exam: There were no vitals taken for this visit. General:   Pleasant, well developed female in no acute distress Heart:  Regular rate and rhythm; no  murmurs Pulm: Clear anteriorly; no wheezing Abdomen:  {BlankSingle:19197::"Distended","Ridged","Soft"},  {BlankSingle:19197::"Flat","Obese","Non-distended"} AB, {BlankSingle:19197::"Absent","Hyperactive, tinkling","Hypoactive","Sluggish","Active"} bowel sounds. {actendernessAB:27319} tenderness {anatomy; site abdomen:5010}. {BlankMultiple:19196::"Without guarding","With guarding","Without rebound","With rebound"}, No organomegaly appreciated. Extremities:  {With/without:5700}  edema. Neurologic:  Alert and  oriented x4;  No focal deficits.  Psych:  Cooperative. Normal mood and affect.   Vladimir Crofts, PA-C 10/18/21

## 2021-10-19 ENCOUNTER — Ambulatory Visit: Payer: Medicaid Other | Admitting: Physician Assistant

## 2021-10-25 ENCOUNTER — Encounter (HOSPITAL_COMMUNITY): Payer: Self-pay

## 2021-10-25 ENCOUNTER — Ambulatory Visit (HOSPITAL_COMMUNITY): Admit: 2021-10-25 | Payer: Medicaid Other | Admitting: Gastroenterology

## 2021-10-25 SURGERY — IMPEDANCE PH STUDY, ESOPHAGUS
Anesthesia: Choice

## 2021-10-30 NOTE — Progress Notes (Deleted)
10/30/2021 [name] [id number] 10/06/1960  Referring provider: Caryl Bis, MD Primary GI doctor: Dr. Loletha Carrow  ASSESSMENT AND PLAN:   There are no diagnoses linked to this encounter.  ASSESSMENT: No diagnosis found.   PLAN:  No orders of the defined types were placed in this encounter.    History of Present Illness:  61 y.o. female  with a past medical history of thymectomy 1997, vocal cord paralysis, LPR/GERD, history of adenomatous polyps, chronic constipation and others listed below, returns to clinic today for evaluation of H. pylori, dysphagia, abdominal pain. Marland Kitchen 07/05/2021 office visit with Dr. Loletha Carrow thought possibly related to constipation started on MiraLAX, scheduled for colonoscopy endoscopy. 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  But did not do bismuth secondary to possible aspirin cross-reaction otherwise normal. Excellent bowel prep, internal hemorrhoids, diverticulosis, 1 diminutive tubular adenomatous polyp, recall 5 years.  Patient had modified barium swallow 08/02/2021 without any aspiration or penetration/retention noted.  Patient did have some clearing of her throat prior to exam related to her cord paralysis.  Possible mild delay through GE with outpatient sensation but passed easily with liquid swallows.  Current Medications:   Current Outpatient Medications (Endocrine & Metabolic):    SYNTHROID 144 MCG tablet, Take 1 tablet (100 mcg total) by mouth daily before breakfast.   Current Outpatient Medications (Cardiovascular):    lisinopril-hydrochlorothiazide (PRINZIDE,ZESTORETIC) 10-12.5 MG per tablet, Take 1 tablet by mouth daily.   Current Outpatient Medications (Respiratory):    PROVENTIL HFA 108 (90 Base) MCG/ACT inhaler, Inhale 2 puffs into the lungs every 4 (four) hours as needed for wheezing or shortness of breath.        Current Outpatient Medications  (Other):    alum & mag hydroxide-simeth (MAALOX MAX) 400-400-40 MG/5ML suspension, Take 5 mLs by mouth every 6 (six) hours as needed for indigestion.   cholecalciferol (VITAMIN D) 1000 units tablet, Take 1,000 Units by mouth daily.   clobetasol cream (TEMOVATE) 3.15 %, Apply 1 application topically 2 (two) times daily.   famotidine (PEPCID) 20 MG tablet, Take 1 tablet (20 mg total) by mouth 2 (two) times daily.   pantoprazole (PROTONIX) 40 MG tablet, Take 40 mg by mouth 2 (two) times daily.   polyethylene glycol powder (GLYCOLAX/MIRALAX) 17 GM/SCOOP powder, Use 17 grams once a day   sucralfate (CARAFATE) 1 GM/10ML suspension, Take 10 mLs (1 g total) by mouth 4 (four) times daily -  with meals and at bedtime.   Facility-Administered Medications Ordered in Other Visits (Other):    Alcohol (DEHYDRATED ALCOHOL 98%) 98 % injection SOLN 5 mL No current facility-administered medications for this visit.  Surgical History:  She  has a past surgical history that includes Abdominal hysterectomy; Esophageal dilation (2006?); Mass excision (1995); Mastectomy (Left, 2007); and Mastectomy. Family History:  Her family history includes Asthma in her mother; Breast cancer in her paternal aunt; Diabetes in her father; Stomach cancer in her paternal aunt; Thyroid disease in her sister. Social History:   reports that she has never smoked. She has never used smokeless tobacco. She reports that she does not drink alcohol and does not use drugs.  Current Medications, Allergies, Past Medical History, Past Surgical History, Family History and Social History were reviewed in [employer] record.  Physical Exam: There were no vitals taken for this visit. General:   Pleasant, well developed female in no acute distress Heart:  Regular rate and rhythm; no  murmurs Pulm: Clear anteriorly; no wheezing Abdomen:  {BlankSingle:19197::"Distended","Ridged","Soft"},  {BlankSingle:19197::"Flat","Obese","Non-distended"} AB, {BlankSingle:19197::"Absent","Hyperactive, tinkling","Hypoactive","Sluggish","Active"} bowel sounds. {actendernessAB:27319} tenderness {anatomy; site abdomen:5010}. {BlankMultiple:19196::"Without guarding","With guarding","Without rebound","With rebound"}, No organomegaly appreciated. Extremities:  {With/without:5700}  edema. Neurologic:  Alert and  oriented x4;  No focal deficits.  Psych:  Cooperative. Normal mood and affect.   Vladimir Crofts, PA-C 10/30/21

## 2021-11-07 ENCOUNTER — Ambulatory Visit: Payer: Medicaid Other | Admitting: Physician Assistant

## 2021-11-23 NOTE — Progress Notes (Deleted)
11/23/2021 [name] [id number] June 17, 1960  Referring provider: Caryl Bis, MD Primary GI doctor: Dr. Loletha Carrow  ASSESSMENT AND PLAN:   There are no diagnoses linked to this encounter.  ASSESSMENT: No diagnosis found.   PLAN:  No orders of the defined types were placed in this encounter.    History of Present Illness:  62 y.o. female  with a past medical history of thymectomy 1997, vocal cord paralysis, LPR/GERD, history of adenomatous polyps, chronic constipation and others listed below, returns to clinic today for evaluation of H. pylori, dysphagia, abdominal pain. Marland Kitchen 07/05/2021 office visit with Dr. Loletha Carrow thought possibly related to constipation started on MiraLAX, scheduled for colonoscopy endoscopy. 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  But did not do bismuth secondary to possible aspirin cross-reaction otherwise normal. Excellent bowel prep, internal hemorrhoids, diverticulosis, 1 diminutive tubular adenomatous polyp, recall 5 years.  Patient had modified barium swallow 08/02/2021 without any aspiration or penetration/retention noted.  Patient did have some clearing of her throat prior to exam related to her cord paralysis.  Possible mild delay through GE with outpatient sensation but passed easily with liquid swallows.  Current Medications:   Current Outpatient Medications (Endocrine & Metabolic):    SYNTHROID 937 MCG tablet, Take 1 tablet (100 mcg total) by mouth daily before breakfast.   Current Outpatient Medications (Cardiovascular):    lisinopril-hydrochlorothiazide (PRINZIDE,ZESTORETIC) 10-12.5 MG per tablet, Take 1 tablet by mouth daily.   Current Outpatient Medications (Respiratory):    PROVENTIL HFA 108 (90 Base) MCG/ACT inhaler, Inhale 2 puffs into the lungs every 4 (four) hours as needed for wheezing or shortness of breath.        Current Outpatient Medications  (Other):    alum & mag hydroxide-simeth (MAALOX MAX) 400-400-40 MG/5ML suspension, Take 5 mLs by mouth every 6 (six) hours as needed for indigestion.   cholecalciferol (VITAMIN D) 1000 units tablet, Take 1,000 Units by mouth daily.   clobetasol cream (TEMOVATE) 9.02 %, Apply 1 application topically 2 (two) times daily.   famotidine (PEPCID) 20 MG tablet, Take 1 tablet (20 mg total) by mouth 2 (two) times daily.   pantoprazole (PROTONIX) 40 MG tablet, Take 40 mg by mouth 2 (two) times daily.   polyethylene glycol powder (GLYCOLAX/MIRALAX) 17 GM/SCOOP powder, Use 17 grams once a day   sucralfate (CARAFATE) 1 GM/10ML suspension, Take 10 mLs (1 g total) by mouth 4 (four) times daily -  with meals and at bedtime.   Facility-Administered Medications Ordered in Other Visits (Other):    Alcohol (DEHYDRATED ALCOHOL 98%) 98 % injection SOLN 5 mL No current facility-administered medications for this visit.  Surgical History:  She  has a past surgical history that includes Abdominal hysterectomy; Esophageal dilation (2006?); Mass excision (1995); Mastectomy (Left, 2007); and Mastectomy. Family History:  Her family history includes Asthma in her mother; Breast cancer in her paternal aunt; Diabetes in her father; Stomach cancer in her paternal aunt; Thyroid disease in her sister. Social History:   reports that she has never smoked. She has never used smokeless tobacco. She reports that she does not drink alcohol and does not use drugs.  Current Medications, Allergies, Past Medical History, Past Surgical History, Family History and Social History were reviewed in [employer] [facility] record.  Physical Exam: There were no vitals taken for this visit. General:   Pleasant, well developed female in no acute distress Heart:  Regular rate and rhythm; no  murmurs Pulm: Clear anteriorly; no wheezing Abdomen:  {BlankSingle:19197::"Distended","Ridged","Soft"},  {BlankSingle:19197::"Flat","Obese","Non-distended"} AB, {BlankSingle:19197::"Absent","Hyperactive, tinkling","Hypoactive","Sluggish","Active"} bowel sounds. {actendernessAB:27319} tenderness {anatomy; site abdomen:5010}. {BlankMultiple:19196::"Without guarding","With guarding","Without rebound","With rebound"}, No organomegaly appreciated. Extremities:  {With/without:5700}  edema. Neurologic:  Alert and  oriented x4;  No focal deficits.  Psych:  Cooperative. Normal mood and affect.   Vladimir Crofts, PA-C 11/23/21

## 2021-12-06 ENCOUNTER — Ambulatory Visit: Payer: Medicaid Other | Admitting: Physician Assistant

## 2022-01-01 ENCOUNTER — Encounter: Payer: Self-pay | Admitting: Gastroenterology

## 2022-02-21 NOTE — Progress Notes (Deleted)
02/21/2022 [name] [id number] 1960-07-04  Referring provider: Caryl Bis, MD Primary GI doctor: Dr. Loletha Carrow  ASSESSMENT AND PLAN:   Assessment: 61 y.o. female here for assessment of the following: No diagnosis found.  Plan:  No orders of the defined types were placed in this encounter.   No orders of the defined types were placed in this encounter.   History of Present Illness:  61 y.o. 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 pylori treatment. Patient had EGD colonoscopy due to globulus sensation, gastritis.  Found of H. pylori gastritis treated with pantoprazole metronidazole and doxycycline in February. Did not have eradication study. Patient called the office 09/29/2021 with laryngitis and hoarseness, scheduled for the office.  06/2018 colonoscopy  2 adenomatous polyps, one greater than 10 mm at the hepatic flexure.  3-year recall recommended. 07/18/2021  colonoscopy and upper endoscopy for globulus sensation, dysphagia showed gastric mucosal atrophy, normal examined duodenum, several biopsies showed H. pylori gastritis, patient given pantoprazole metronidazole and doxycycline.  Bismuth was left off due to allergic cross-reactivity to aspirin possible Commended BMS and esophageal manometry/pH impedance testing on PPI Colonoscopy showed Bowel prep, diminutive TA polyp ascending colon multiple diverticula in left colon, internal hemorrhoids recall 5 years. (07/2026)  She  reports that she has never smoked. She has never used smokeless tobacco. She reports that she does not drink alcohol and does not use drugs. Her family history includes Asthma in her mother; Breast cancer in her paternal aunt;  Diabetes in her father; Stomach cancer in her paternal aunt; Thyroid disease in her sister.   Current Medications:   Current Outpatient Medications (Endocrine & Metabolic):    SYNTHROID 938 MCG tablet, Take 1 tablet (100 mcg total) by mouth daily before breakfast.   Current Outpatient Medications (Cardiovascular):    lisinopril-hydrochlorothiazide (PRINZIDE,ZESTORETIC) 10-12.5 MG per tablet, Take 1 tablet by mouth daily.   Current Outpatient Medications (Respiratory):    PROVENTIL HFA 108 (90 Base) MCG/ACT inhaler, Inhale 2 puffs into the lungs every 4 (four) hours as needed for wheezing or shortness of breath.        Current Outpatient Medications (Other):    alum & mag hydroxide-simeth (MAALOX MAX) 400-400-40 MG/5ML suspension, Take 5 mLs by mouth every 6 (six) hours as needed for indigestion.   cholecalciferol (VITAMIN D) 1000 units tablet, Take 1,000 Units by mouth daily.   clobetasol cream (TEMOVATE) 1.82 %, Apply 1 application topically 2 (two) times daily.   famotidine (PEPCID) 20 MG tablet, Take 1 tablet (20 mg total) by mouth 2 (two) times daily.   pantoprazole (PROTONIX) 40 MG tablet, Take 40 mg by mouth 2 (two) times daily.   polyethylene glycol powder (GLYCOLAX/MIRALAX) 17 GM/SCOOP powder, Use 17 grams once a day   sucralfate (CARAFATE) 1 GM/10ML suspension, Take 10 mLs (1 g total) by mouth 4 (four) times daily -  with meals and at bedtime.   Facility-Administered Medications Ordered in Other Visits (Other):    Alcohol (DEHYDRATED ALCOHOL 98%) 98 % injection SOLN 5 mL No current facility-administered medications for this visit.  Surgical History:  She  has a past surgical history that includes Abdominal hysterectomy; Esophageal dilation (2006?); Mass excision (1995); Mastectomy (Left, 2007); and Mastectomy.  Current Medications, Allergies, Past Medical History,  Past Surgical History, Family History and Social History were reviewed in [employer] record.  Physical Exam: There were no vitals taken for this visit. General:   Pleasant, well developed female in no acute distress Heart : Regular rate and rhythm; no murmurs Pulm: Clear anteriorly; no wheezing Abdomen:  {BlankSingle:19197::"Distended","Ridged","Soft"}, {BlankSingle:19197::"Flat","Obese","Non-distended"} AB, {BlankSingle:19197::"Absent","Hyperactive, tinkling","Hypoactive","Sluggish","Active"} bowel sounds. {actendernessAB:27319} tenderness {anatomy; site abdomen:5010}. {BlankMultiple:19196::"Without guarding","With guarding","Without rebound","With rebound"}, No organomegaly appreciated. Rectal: {acrectalexam:27461} Extremities:  {With/without:5700}  edema. Neurologic:  Alert and  oriented x4;  No focal deficits.  Psych:  Cooperative. Normal mood and affect.   Vladimir Crofts, PA-C 02/21/22

## 2022-02-22 ENCOUNTER — Ambulatory Visit: Payer: Medicaid Other | Admitting: Physician Assistant

## 2022-04-12 ENCOUNTER — Ambulatory Visit: Payer: Medicaid Other | Admitting: Internal Medicine

## 2022-04-12 NOTE — Progress Notes (Deleted)
Patient ID: [name], female   DOB: 1961-02-09, 61 y.o.   MRN: [id number]   HPI  [name] is a 61 y.o.-year-old female, initially referred by her PCP, Dr. 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  Last visit 1 year ago.  Interim history: She describes occasional hoarseness  -reviewed from her previous thymus surgery but also from recent allergies. She has no complaints otherwise.  Left thyroid nodule -Diagnosed in 2015  Reviewed prev. Investigations: She sees ENT (Dr. Redmond Baseman) after her neck Sx in 1997. 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 FNA (09/15/2013): AUS FNA (01/14/2014): FLUS Thyroid U/S (10/04/2017): Complex left lobe nodule 1 measures 4.9 x 3.9 x 3.9 cm and previously measured 3.6 x 2.9 x 3.0 cm. Biopsy has been performed previously. At last visit, I referred her for alcohol instillation in the cyst (Dr. Kathlene Cote): FNA + EtOH instillation (03/31/2018):  Scant ep. cells  Thyroid U/S (02/04/2021): Parenchymal Echotexture: Mildly heterogenous Isthmus: 0.2 cm Right lobe: 2.0 x 0.8 x 0.8 cm Left lobe: 4.9 x 3.4 x 3.4 cm _________________________________________________________   Again seen is a large solid and cystic nodule involving almost the entirety of the left thyroid lobe measuring 4.9 x 3.4 x 3.4 cm, previously 4.9 x 4.0 x 3.9 cm. This nodule was previously biopsied in 2015.   IMPRESSION: Stable appearance of a large left thyroid nodule measuring up to 4.9 cm, essentially unchanged since 2019. This nodule was previously biopsied in 2015, correlate with biopsy results.  Pt denies: - feeling nodules in neck - dysphagia - choking - SOB with lying down  Hypothyroidism: -postablation -not well controlled.  In the last 1.5 years, we have been decreasing the dose of levothyroxine from 175 to 112 mcg daily. However, at last visit, she was given 125 mcg from  the pharmacy. We then decreased back to 112 mcg daily, then to 100 mcg daily.  She takes Synthroid DAW -as she had choking with the generic levothyroxine in the past (although, upon questioning, she is not sure whether she is given the brand name from the pharmacy) -choking improved. - in am - fasting - at least 30 min from b'fast - no calcium - no iron - no multivitamins - + PPIs >4h after LT4 - not on Biotin  Reviewed her TFTs: Lab Results  Component Value Date   TSH 3.07 04/12/2021   TSH 3.09 07/07/2020   TSH 3.39 03/22/2020   TSH 0.11 (L) 03/20/2019   TSH 0.112 (L) 09/06/2018   TSH 0.13 (L) 04/28/2018   TSH <0.01 Repeated and verified X2. (L) 03/10/2018   TSH 0.088 (L) 01/01/2018   TSH 42.838 (H) 07/03/2017   FREET4 1.03 04/12/2021   FREET4 1.07 07/07/2020   FREET4 0.95 03/22/2020   FREET4 1.45 03/20/2019   FREET4 1.21 04/28/2018   FREET4 1.58 03/10/2018    She continues to have hot flushes.  She has a family history of thyroid disease in her sister. No FH of thyroid cancer. No h/o radiation tx to head or neck. No herbal supplements. No Biotin use. No recent steroids use.   She has a history of breast cancer diagnosed in 61- sees Dr. Marin Olp. She also has a history of HTN.   She also has vit D def. >> previously on 1000 units vitamin D daily >> increased to 4000 units vitamin D daily >> on 5000 units.  At last visit, I  advised her to take this consistently.  Reviewed vitamin levels: Lab Results  Component Value Date   VD25OH 24.06 (L) 04/12/2021   VD25OH 14.9 (L) 07/07/2020   VD25OH 17.2 (L) 01/01/2018   Lab Results  Component Value Date   EQASTMHD62 229 07/07/2020     ROS: + See HPI Neurological: no tremors/no numbness/+ tingling and burning/no dizziness/+ paresthesias - was on Gabapentin before  I reviewed pt's medications, allergies, PMH, social hx, family hx, and changes were documented in the history of present illness. Otherwise, unchanged from my  initial visit note.  Past Medical History:  Diagnosis Date   Acid reflux    Cancer ([place]) 1995   breast cancer   Essential hypertension 02/16/2015   Hypertension    Obesity 02/16/2015   Post-operative nausea and vomiting    Thyroid disease    Vertigo    Past Surgical History:  Procedure Laterality Date   ABDOMINAL HYSTERECTOMY     ESOPHAGEAL DILATION  2006?   Dr.Hayes   MASS EXCISION  1995   thymus gland & LEFT VOCAL CORD   MASTECTOMY Left 2007   MASTECTOMY     Social History   Socioeconomic History   Marital status: Single    Spouse name: Not on file   Number of children: Not on file   Years of education: Not on file   Highest education level: Not on file  Occupational History   Not on file  Tobacco Use   Smoking status: Never   Smokeless tobacco: Never   Tobacco comments:    never used tobacco  Vaping Use   Vaping Use: Never used  Substance and Sexual Activity   Alcohol use: No   Drug use: No   Sexual activity: Yes    Birth control/protection: Surgical    Comment: hysterectomy  Other Topics Concern   Not on file  Social History Narrative   Not on file   Social Determinants of Health   Financial Resource Strain: Low Risk  (02/27/2021)   Overall Financial Resource Strain (CARDIA)    Difficulty of Paying Living Expenses: Not hard at all  Food Insecurity: No Food Insecurity (02/27/2021)   Hunger Vital Sign    Worried About Running Out of Food in the Last Year: Never true    [place] in the Last Year: Never true  Transportation Needs: No Transportation Needs (02/27/2021)   PRAPARE - Hydrologist (Medical): No    Lack of Transportation (Non-Medical): No  Physical Activity: Insufficiently Active (02/27/2021)   Exercise Vital Sign    Days of Exercise per Week: 3 days    Minutes of Exercise per Session: 30 min  Stress: No Stress Concern Present (02/27/2021)   [place]    Feeling of Stress : Not at all  Social Connections: Moderately Isolated (02/27/2021)   Social Connection and Isolation Panel [NHANES]    Frequency of Communication with Friends and Family: More than three times a week    Frequency of Social Gatherings with Friends and Family: More than three times a week    Attends Religious Services: More than 4 times per year    Active Member of [employer] or Organizations: No    Attends Archivist Meetings: Never    Marital Status: Never married  Intimate Partner Violence: Not At Risk (02/27/2021)   Humiliation, Afraid, Rape, and Kick questionnaire    Fear of Current or  Ex-Partner: No    Emotionally Abused: No    Physically Abused: No    Sexually Abused: No   Current Outpatient Medications on File Prior to Visit  Medication Sig Dispense Refill   alum & mag hydroxide-simeth (MAALOX MAX) 400-400-40 MG/5ML suspension Take 5 mLs by mouth every 6 (six) hours as needed for indigestion. 355 mL 0   cholecalciferol (VITAMIN D) 1000 units tablet Take 1,000 Units by mouth daily.     clobetasol cream (TEMOVATE) 8.24 % Apply 1 application topically 2 (two) times daily. 45 g 1   famotidine (PEPCID) 20 MG tablet Take 1 tablet (20 mg total) by mouth 2 (two) times daily. 30 tablet 0   lisinopril-hydrochlorothiazide (PRINZIDE,ZESTORETIC) 10-12.5 MG per tablet Take 1 tablet by mouth daily. 30 tablet 2   pantoprazole (PROTONIX) 40 MG tablet Take 40 mg by mouth 2 (two) times daily.     polyethylene glycol powder (GLYCOLAX/MIRALAX) 17 GM/SCOOP powder Use 17 grams once a day 255 g 3   PROVENTIL HFA 108 (90 Base) MCG/ACT inhaler Inhale 2 puffs into the lungs every 4 (four) hours as needed for wheezing or shortness of breath.   3   sucralfate (CARAFATE) 1 GM/10ML suspension Take 10 mLs (1 g total) by mouth 4 (four) times daily -  with meals and at bedtime. 420 mL 0   SYNTHROID 100 MCG tablet Take 1 tablet (100 mcg total) by mouth daily before breakfast. 90  tablet 3   Current Facility-Administered Medications on File Prior to Visit  Medication Dose Route Frequency Provider Last Rate Last Admin   Alcohol (DEHYDRATED ALCOHOL 98%) 98 % injection SOLN 5 mL  5 mL Intravenous Once Docia Barrier, PA       Allergies  Allergen Reactions   Aspirin Swelling   Elemental Sulfur Nausea Only   Family History  Problem Relation Age of Onset   Diabetes Father    Asthma Mother    Thyroid disease Sister    Breast cancer Paternal Aunt    Stomach cancer Paternal Aunt    Colon cancer Neg Hx    Colon polyps Neg Hx    Esophageal cancer Neg Hx    Rectal cancer Neg Hx     PE: There were no vitals taken for this visit. Wt Readings from Last 3 Encounters:  07/18/21 197 lb (89.4 kg)  07/08/21 197 lb (89.4 kg)  07/06/21 197 lb 1.9 oz (89.4 kg)   Constitutional: overweight, in NAD Eyes:  EOMI, no exophthalmos ENT: + Palpable left neck fullness, no cervical lymphadenopathy Cardiovascular: RRR, No MRG Respiratory: CTA B Musculoskeletal: no deformities Skin:no rashes Neurological: no tremor with outstretched hands  ASSESSMENT: 1. Thyroid nodule  2.  Hypothyroidism  3.  Tingling in the extremities  PLAN: 1. Thyroid nodule -Patient with history of a large left dominant thyroid nodule, almost entirely cystic, s/p drainage of the cyst and ethanol instillation in 03/2018 since previous aspirations did not prevent reforming of the cyst, s/p drainage of the cyst.  Reviewing the thyroid ultrasound images, they do not show any concerning nodule features: No calcifications, no internal blood flow, no taller than wide distribution and also no infiltration in the surrounding tissue.  Also, she does not have a thyroid cancer family history or personal history of radiation therapy to head or neck.  All of these would favor benignity.  The previous biopsies are inconclusive most likely due to the cystic nature of the nodule. -In the past, she had occasional  choking  but  it was unclear whether this was related to the thyroid nodule or not.  No recent choking. -No neck compression symptoms.  She does have hoarseness, which is chronic for her, after sinus surgery, with occasional exacerbations -Before last visit we checked another thyroid ultrasound which shows that her thyroid cyst had the same dimensions as before, may be slightly smaller, but without any other change in ultrasound characteristics.  No other nodules or abnormalities identified. -No need to recheck her thyroid ultrasound for now -we will continue to follow her clinically -I will see her back in 1 year  2. Hypothyroidism - latest thyroid labs reviewed with pt. >> normal: Lab Results  Component Value Date   TSH 3.07 04/12/2021  - she continues on LT4 100 mcg daily -she is on brand-name Synthroid as she had choking with generic levothyroxine - pt feels good on this dose. - we discussed about taking the thyroid hormone every day, with water, >30 minutes before breakfast, separated by >4 hours from acid reflux medications, calcium, iron, multivitamins. Pt. is taking it correctly. - will check thyroid tests today: TSH and fT4 - If labs are abnormal, she will need to return for repeat TFTs in 1.5 months  3.  Vitamin D deficiency -We previously checked a B12 and vitamin D level due to tingling in extremities.  Vitamin D was very low, at 14.9.  -At that time we increased her supplement-currently on 5000 units vitamin D daily -Platelet level was still slightly low at last visit, at 24 -we discussed about taking the vitamin D supplement consistently -We will recheck her level today  Needs refills.  Philemon Kingdom, MD PhD [facility]

## 2022-04-23 ENCOUNTER — Other Ambulatory Visit: Payer: Self-pay | Admitting: Hematology & Oncology

## 2022-04-23 DIAGNOSIS — Z1231 Encounter for screening mammogram for malignant neoplasm of breast: Secondary | ICD-10-CM

## 2022-04-24 NOTE — Therapy (Unsigned)
OUTPATIENT PHYSICAL THERAPY SHOULDER EVALUATION   Patient Name: [name] MRN: [id number] DOB:08-19-1960, 61 y.o., female Today's Date: 04/25/2022   PT End of Session - 04/25/22 1033     Visit Number 1    Number of Visits --   1-2x/week   Date for PT Re-Evaluation 06/20/22    Authorization Type [employer] MCD - quick dash    PT Start Time 1006    PT Stop Time 1038    PT Time Calculation (min) 32 min             Past Medical History:  Diagnosis Date   Acid reflux    Cancer ([place]) 1995   breast cancer   Essential hypertension 02/16/2015   Hypertension    Obesity 02/16/2015   Post-operative nausea and vomiting    Thyroid disease    Vertigo    Past Surgical History:  Procedure Laterality Date   ABDOMINAL HYSTERECTOMY     ESOPHAGEAL DILATION  2006?   Dr.Hayes   MASS EXCISION  1995   thymus gland & LEFT VOCAL CORD   MASTECTOMY Left 2007   MASTECTOMY     Patient Active Problem List   Diagnosis Date Noted   Labial lesion 02/27/2021   Foreign body sensation in throat 03/10/2019   Hoarseness 03/10/2019   Vocal cord paralysis, unilateral complete 03/10/2019   Thyroid nodule 03/10/2018   Postablative hypothyroidism 03/10/2018   Essential hypertension 02/16/2015   Obesity 02/16/2015   Acute sinus infection 06/09/2014   Adenocarcinoma of left breast ([place]) 10/27/2013    PCP: Caryl Bis, MD  REFERRING PROVIDER: Caryl Bis, MD  THERAPY DIAG:  Chronic left shoulder pain - Plan: PT plan of care cert/re-cert  Muscle weakness - Plan: PT plan of care cert/re-cert  REFERRING DIAG: Pain in left shoulder [M25.512]   Rationale for Evaluation and Treatment:  Rehabilitation  SUBJECTIVE:  PERTINENT PAST HISTORY:  Left breast cancer with mastectomy (1995), thyroid      PRECAUTIONS: None  WEIGHT BEARING RESTRICTIONS No  FALLS:  Has patient fallen in last 6 months? No, Number of falls: 0  MOI/History of condition:  Onset date: 3-4 months  [name] is a 61 y.o. female who presents to clinic with chief complaint of L shoulder pain and stiffness.  She is R handed.  She recalls no injury.  She does not feel that the stiffness is consistently progressing.  It is somewhat variable day to day.  She infrequently has some n/t.  She states she does have some lymphedema present in her L axilla.  She did have a steroid injection which she states helped only for less than 24 hours.   Pain:  Are you having pain? Yes Pain location: diffuse L shoulder pain  NPRS scale:  current 6/10  average 8/10  Aggravating factors: reaching, lifting  NPRS, highest: 10/10 Relieving factors: rest  NPRS: best: 2/10 Pain description: sharp and aching Stage: Chronic Stability: staying the same 24 hour pattern: worst in morning and at night   Occupation: NA  Assistive Device: NA  Hand Dominance: R  Patient Goals/Specific Activities: pain relief   OBJECTIVE:    DIAGNOSTIC FINDINGS:   Pt reports that X-ray was (-)  GENERAL OBSERVATION:  Strong shrug sign on L     SENSATION:  Light touch: Appears intact   PALPATION: TTP infraspinatus, increased edema L axilla.  UPPER EXTREMITY AROM:  ROM Right 04/25/2022 Left 04/25/2022  Shoulder flexion 145 80*  Shoulder abduction WFL 65*  Shoulder internal rotation    Shoulder external rotation    Functional IR L1 L GT  Functional ER t4 C7*  Shoulder extension    Elbow extension    Elbow flexion     (Blank rows = not tested, N = WNL, * = concordant pain with testing)  UPPER EXTREMITY MMT:  MMT Right 04/25/2022 Left 04/25/2022  Shoulder flexion 4 3** in available range   Shoulder abduction (C5) 4 3** in available range   Shoulder ER 4 3** in available range   Shoulder IR    Middle trapezius    Lower trapezius    Shoulder extension    Grip strength    Cervical flexion (C1,C2)    Cervical S/B (C3)    Shoulder shrug (C4)    Elbow flexion (C6)    Elbow ext (C7)     Thumb ext (C8)    Finger abd (T1)    Grossly     (Blank rows = not tested, score listed is out of 5 possible points.  N = WNL, D = diminished, C = clear for gross weakness with myotome testing, * = concordant pain with testing)  UPPER EXTREMITY PROM:  PROM Right 04/25/2022 Left 04/25/2022  Shoulder flexion  95*  Shoulder abduction  90*  Shoulder internal rotation    Shoulder external rotation  30*  Functional IR    Functional ER    Shoulder extension    Elbow extension    Elbow flexion     (Blank rows = not tested, N = WNL, * = concordant pain with testing)  JOINT MOBILITY TESTING:  Mod restriction AP GH mob - no increase in pain  PATIENT SURVEYS:  Quick Dash 34    TODAY'S TREATMENT:  Creating, reviewing, and completing below HEP   PATIENT EDUCATION:  POC, diagnosis, prognosis, HEP, and outcome measures.  Pt educated via explanation, demonstration, and handout (HEP).  Pt confirms understanding verbally.   HOME EXERCISE PROGRAM: Access Code: [email or web address] URL: [email or web address] Date: 04/25/2022 Prepared by: Shearon Balo  Exercises - Standing Isometric Shoulder External Rotation with Doorway  - 3 x daily - 7 x weekly - 1 sets - 10 reps - 5 hold - Standing Isometric Shoulder Flexion with Doorway - Arm Bent  - 1 x daily - 7 x weekly - 3 sets - 10 reps  ASTERISK SIGNS   Asterisk Signs Eval (04/25/2022)       Shoulder flexion 85 w/ P!       Shoulder flexion MMT 3 w/ P!       Max pain  10/10                         ASSESSMENT:  CLINICAL IMPRESSION: [name] is a 61 y.o. female who presents to clinic with signs and sxs consistent with L SAPS.  Ruling down AC d/t PROM > AROM, pain with contractile tests, and minimal pain with GH joint mobilization.  Unclear if edema in L axilla is playing a role.  OBJECTIVE IMPAIRMENTS: Pain, shoulder ROM, shoulder strength  ACTIVITY LIMITATIONS: lifting, reaching, housework  PERSONAL FACTORS: See medical  history and pertinent history   REHAB POTENTIAL: Good  CLINICAL DECISION MAKING: Stable/uncomplicated  EVALUATION COMPLEXITY: Low   GOALS:   SHORT TERM GOALS: Target date: 05/23/2022  [name] will be >75% HEP compliant to improve carryover between sessions and facilitate independent management of condition  Evaluation (04/25/2022): ongoing Goal status: INITIAL  LONG TERM GOALS: Target date: 06/20/2022  Mathilde will show a >/= 11 pt improvement in her QUICK DASH score as a proxy for functional improvement   Evaluation/Baseline (04/25/2022): 34 pts Goal status: INITIAL   2.  Ezabella will self report >/= 50% decrease in pain from evaluation   Evaluation/Baseline (04/25/2022): 10/10 max pain Goal status: INITIAL   3.  Aireal will demonstrate >125 degrees of active ROM in flexion to allow completion of activities involving reaching [place], not limited by pain  Evaluation/Baseline (04/25/2022): 85 degrees w/ pain Goal status: INITIAL    4.  Chamika will improve the following MMTs to >/= 4/5 to show improvement in strength:  ER and Flexion   Evaluation/Baseline (04/25/2022): see chart in note Goal status: INITIAL   5.  Ersel will be able to reach into cabinet to put away light objects, not limited by pain  Evaluation/Baseline (04/25/2022): limited Goal status: INITIAL s  PLAN: PT FREQUENCY: 1-2x/week  PT DURATION: 8 weeks (Ending 06/20/2022)  PLANNED INTERVENTIONS: Therapeutic exercises, Aquatic therapy, Therapeutic activity, Neuro Muscular re-education, Gait training, Patient/Family education, Joint mobilization, Dry Needling, Electrical stimulation, Spinal mobilization and/or manipulation, Moist heat, Taping, Vasopneumatic device, Ionotophoresis '4mg'$ /ml Dexamethasone, and Manual therapy  PLAN FOR NEXT SESSION: GH joint mobilization progressive R/C and periscapular strengthening.  Lymphedema massage?    Shearon Balo PT, DPT 04/25/2022, 10:54 AM

## 2022-04-25 ENCOUNTER — Other Ambulatory Visit: Payer: Self-pay

## 2022-04-25 ENCOUNTER — Encounter: Payer: Self-pay | Admitting: Physical Therapy

## 2022-04-25 ENCOUNTER — Ambulatory Visit: Payer: Medicaid Other | Attending: Family Medicine | Admitting: Physical Therapy

## 2022-04-25 DIAGNOSIS — R1310 Dysphagia, unspecified: Secondary | ICD-10-CM | POA: Diagnosis present

## 2022-04-25 DIAGNOSIS — M6281 Muscle weakness (generalized): Secondary | ICD-10-CM | POA: Insufficient documentation

## 2022-04-25 DIAGNOSIS — M25512 Pain in left shoulder: Secondary | ICD-10-CM | POA: Diagnosis not present

## 2022-04-25 DIAGNOSIS — G8929 Other chronic pain: Secondary | ICD-10-CM | POA: Diagnosis present

## 2022-04-30 ENCOUNTER — Ambulatory Visit
Admission: RE | Admit: 2022-04-30 | Discharge: 2022-04-30 | Disposition: A | Discharge status: Home / Self Care | Payer: Medicaid Other | Source: Ambulatory Visit | Attending: Hematology & Oncology | Admitting: Hematology & Oncology

## 2022-04-30 DIAGNOSIS — Z1231 Encounter for screening mammogram for malignant neoplasm of breast: Secondary | ICD-10-CM

## 2022-05-02 ENCOUNTER — Other Ambulatory Visit: Payer: Self-pay | Admitting: Internal Medicine

## 2022-05-09 ENCOUNTER — Ambulatory Visit: Payer: Medicaid Other | Admitting: Physical Therapy

## 2022-05-09 ENCOUNTER — Encounter: Payer: Self-pay | Admitting: Physical Therapy

## 2022-05-09 DIAGNOSIS — M25512 Pain in left shoulder: Secondary | ICD-10-CM | POA: Diagnosis not present

## 2022-05-09 DIAGNOSIS — R1310 Dysphagia, unspecified: Secondary | ICD-10-CM

## 2022-05-09 DIAGNOSIS — M6281 Muscle weakness (generalized): Secondary | ICD-10-CM

## 2022-05-09 DIAGNOSIS — G8929 Other chronic pain: Secondary | ICD-10-CM

## 2022-05-09 NOTE — Therapy (Signed)
OUTPATIENT PHYSICAL THERAPY TREATMENT NOTE   Patient Name: [name] MRN: [id number] DOB:1960/12/24, 61 y.o., female Today's Date: 05/09/2022  PCP: Caryl Bis, MD   REFERRING PROVIDER: Caryl Bis, MD   PT End of Session - 05/09/22 1009     Visit Number 2    Number of Visits --   1-2x/week   Date for PT Re-Evaluation 06/20/22    Authorization Type [employer] MCD - quick dash    PT Start Time 1008   pt arrived late   PT Stop Time 1040    PT Time Calculation (min) 32 min             Past Medical History:  Diagnosis Date   Acid reflux    Cancer ([place]) 1995   breast cancer   Essential hypertension 02/16/2015   Hypertension    Obesity 02/16/2015   Post-operative nausea and vomiting    Thyroid disease    Vertigo    Past Surgical History:  Procedure Laterality Date   ABDOMINAL HYSTERECTOMY     ESOPHAGEAL DILATION  2006?   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 Yes Pain location: diffuse L shoulder pain  NPRS scale:  current 0/10  Aggravating  factors: reaching, lifting Relieving factors: rest Pain description: sharp and aching Stage: Chronic Stability: staying the same 24 hour pattern: worst in morning and at night   OBJECTIVE: (objective measures completed at initial evaluation unless otherwise dated)   DIAGNOSTIC FINDINGS:   Pt reports that X-ray was (-)   GENERAL OBSERVATION:          Strong shrug sign on L                               SENSATION:          Light touch: Appears intact           PALPATION: TTP infraspinatus, increased edema L axilla.   UPPER EXTREMITY AROM:   ROM Right 04/25/2022 Left 04/25/2022  Shoulder flexion 145 80*  Shoulder abduction WFL 65*  Shoulder internal rotation      Shoulder external rotation      Functional IR L1 L GT  Functional ER t4 C7*  Shoulder extension      Elbow extension      Elbow flexion        (Blank rows = not tested, N = WNL, * = concordant pain with testing)   UPPER EXTREMITY MMT:   MMT Right  04/25/2022 Left 04/25/2022  Shoulder flexion 4 3** in available range   Shoulder abduction (C5) 4 3** in available range   Shoulder ER 4 3** in available range   Shoulder IR      Middle trapezius      Lower trapezius      Shoulder extension      Grip strength      Cervical flexion (C1,C2)      Cervical S/B (C3)      Shoulder shrug (C4)      Elbow flexion (C6)      Elbow ext (C7)      Thumb ext (C8)      Finger abd (T1)      Grossly        (Blank rows = not tested, score listed is out of 5 possible points.  N = WNL, D = diminished, C = clear for gross weakness with myotome testing, * = concordant pain with testing)   UPPER EXTREMITY PROM:   PROM Right 04/25/2022 Left 04/25/2022  Shoulder flexion   95*  Shoulder abduction   90*  Shoulder internal rotation      Shoulder external rotation   30*  Functional IR      Functional ER      Shoulder extension      Elbow extension      Elbow flexion        (Blank rows = not tested, N = WNL, * = concordant  pain with testing)   JOINT MOBILITY TESTING:  Mod restriction AP GH mob - no increase in pain   PATIENT SURVEYS:  Quick Dash 34              TODAY'S TREATMENT:  Creating, reviewing, and completing below HEP     PATIENT EDUCATION:  POC, diagnosis, prognosis, HEP, and outcome measures.  Pt educated via explanation, demonstration, and handout (HEP).  Pt confirms understanding verbally.    HOME EXERCISE PROGRAM: Access Code: [email or web address] URL: [email or web address] Date: 05/09/2022 Prepared by: Shearon Balo  Exercises - Standing Shoulder Posterior Capsule Stretch  - 2 x daily - 4 x weekly - 3 sets - 1 reps - 45 hold - Standing Isometric Shoulder External Rotation with Doorway  - 3 x daily - 7 x weekly - 1 sets - 10 reps - 5 hold - Standing Isometric Shoulder Flexion with Doorway - Arm Bent  - 1 x daily - 7 x weekly - 3 sets - 10 reps - Sidelying Shoulder External Rotation  - 1 x daily - 7 x weekly - 3 sets - 10 reps   ASTERISK SIGNS     Asterisk Signs Eval (04/25/2022)            Shoulder flexion 85 w/ P!            Shoulder flexion MMT 3 w/ P!            Max pain  10/10                                             Today's Treatment:  Therapeutic Exercise: - UBE - L2 - 2.5'/2.5' - S/L ER - 3x10 - S/L abduction - non  painful arc - 3x10 (~90 degrees) - GTB row - 3x10  Manual Therapy: - Flexion and ER - G III - IV AP GH joint mobilization with  light distraction   ASSESSMENT:   CLINICAL IMPRESSION: [name] tolerated session well with no adverse reaction.  AP GH joint mobilization significantly improved ER ROM suggesting posterior capsule tightness.  HE updated.   OBJECTIVE IMPAIRMENTS: Pain, shoulder ROM, shoulder strength   ACTIVITY LIMITATIONS: lifting, reaching, housework   PERSONAL FACTORS: See medical history and pertinent history     REHAB POTENTIAL: Good   CLINICAL DECISION MAKING: Stable/uncomplicated   EVALUATION COMPLEXITY: Low      GOALS:     SHORT TERM GOALS: Target date: 05/23/2022   [name] will be >75% HEP compliant to improve carryover between sessions and facilitate independent management of condition   Evaluation (04/25/2022): ongoing Goal status: INITIAL     LONG TERM GOALS: Target date: 06/20/2022   [name] will show a >/= 11 pt improvement in her QUICK DASH score as a proxy for functional improvement    Evaluation/Baseline (04/25/2022): 34 pts Goal status: INITIAL     2.  [name] will self report >/= 50% decrease in pain from evaluation    Evaluation/Baseline (04/25/2022): 10/10 max pain Goal status: INITIAL     3.  [name] will demonstrate >125 degrees of active ROM in flexion to allow completion of activities involving reaching [name], not limited by pain   Evaluation/Baseline (04/25/2022): 85 degrees w/ pain Goal status: INITIAL       4.  [name] will improve the following MMTs to >/= 4/5 to show improvement in strength:  ER and Flexion    Evaluation/Baseline (04/25/2022): see chart in note Goal status: INITIAL     5.  [name] will be able to reach into cabinet to put away light objects, not limited by pain   Evaluation/Baseline (04/25/2022): limited Goal status: INITIAL s   PLAN: PT FREQUENCY: 1-2x/week   PT DURATION: 8 weeks (Ending 06/20/2022)   PLANNED INTERVENTIONS: Therapeutic exercises, Aquatic therapy, Therapeutic activity, Neuro Muscular re-education, Gait training, Patient/Family education, Joint mobilization, Dry Needling, Electrical stimulation, Spinal mobilization and/or manipulation, Moist heat, Taping, Vasopneumatic device, Ionotophoresis '4mg'$ /ml Dexamethasone, and Manual therapy   PLAN FOR NEXT SESSION: GH joint mobilization progressive R/C and periscapular strengthening.  Lymphedema massage?    Kevan Ny Arrietty Dercole PT 05/09/2022, 10:45 AM

## 2022-05-11 ENCOUNTER — Ambulatory Visit: Payer: Medicaid Other | Admitting: Physical Therapy

## 2022-05-15 ENCOUNTER — Ambulatory Visit: Payer: Medicaid Other | Admitting: Physical Therapy

## 2022-05-17 ENCOUNTER — Ambulatory Visit: Payer: Medicaid Other | Admitting: Physical Therapy

## 2022-05-19 NOTE — Therapy (Incomplete)
OUTPATIENT PHYSICAL THERAPY TREATMENT NOTE   Patient Name: [name] MRN: [id number] DOB:1961-02-05, 61 y.o., female Today's Date: 05/19/2022  PCP: Lauren Bis, MD   REFERRING PROVIDER: Caryl Bis, MD     Past Medical History:  Diagnosis Date   Acid reflux    Cancer [facility]) 1995   breast cancer   Essential hypertension 02/16/2015   Hypertension    Obesity 02/16/2015   Post-operative nausea and vomiting    Thyroid disease    Vertigo    Past Surgical History:  Procedure Laterality Date   ABDOMINAL HYSTERECTOMY     ESOPHAGEAL DILATION  2006?   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   Rationale for Evaluation and Treatment Rehabilitation  REFERRING DIAG: Pain in left shoulder [M25.512]    PERTINENT HISTORY: Left breast cancer with mastectomy (1995), thyroid   PRECAUTIONS/RESTRICTIONS:   none  SUBJECTIVE:  Pt reports that her shoulder pain comes and goes but is minimal at this time  Pain:  Are you having pain? Yes Pain location: diffuse L shoulder pain  NPRS scale:  current 0/10  Aggravating factors: reaching, lifting Relieving factors: rest Pain description: sharp and aching Stage: Chronic Stability: staying the same 24 hour pattern: worst in morning and at night   OBJECTIVE: (objective measures completed at initial evaluation unless otherwise dated)   DIAGNOSTIC FINDINGS:   Pt reports that X-ray was (-)   GENERAL  OBSERVATION:          Strong shrug sign on L                               SENSATION:          Light touch: Appears intact           PALPATION: TTP infraspinatus, increased edema L axilla.   UPPER EXTREMITY AROM:   ROM Right 04/25/2022 Left 04/25/2022  Shoulder flexion 145 80*  Shoulder abduction WFL 65*  Shoulder internal rotation      Shoulder external rotation      Functional IR L1 L GT  Functional ER t4 C7*  Shoulder extension      Elbow extension      Elbow flexion        (Blank rows = not tested, N = WNL, * = concordant pain with testing)   UPPER EXTREMITY MMT:   MMT Right 04/25/2022 Left 04/25/2022  Shoulder flexion 4 3** in available range   Shoulder abduction (C5) 4 3** in available range   Shoulder ER 4 3** in available range   Shoulder IR      Middle trapezius      Lower trapezius      Shoulder extension      Grip strength      Cervical flexion (C1,C2)      Cervical S/B (C3)      Shoulder shrug (C4)  Elbow flexion (C6)      Elbow ext (C7)      Thumb ext (C8)      Finger abd (T1)      Grossly        (Blank rows = not tested, score listed is out of 5 possible points.  N = WNL, D = diminished, C = clear for gross weakness with myotome testing, * = concordant pain with testing)   UPPER EXTREMITY PROM:   PROM Right 04/25/2022 Left 04/25/2022  Shoulder flexion   95*  Shoulder abduction   90*  Shoulder internal rotation      Shoulder external rotation   30*  Functional IR      Functional ER      Shoulder extension      Elbow extension      Elbow flexion        (Blank rows = not tested, N = WNL, * = concordant pain with testing)   JOINT MOBILITY TESTING:  Mod restriction AP GH mob - no increase in pain   PATIENT SURVEYS:  Quick Dash 34              TODAY'S TREATMENT:  Creating, reviewing, and completing below HEP     PATIENT EDUCATION:  POC, diagnosis, prognosis, HEP, and outcome measures.  Pt educated via explanation,  demonstration, and handout (HEP).  Pt confirms understanding verbally.    HOME EXERCISE PROGRAM: Access Code: [email or web address] URL: [email or web address] Date: 05/09/2022 Prepared by: [name]  Exercises - Standing Shoulder Posterior Capsule Stretch  - 2 x daily - 4 x weekly - 3 sets - 1 reps - 45 hold - Standing Isometric Shoulder External Rotation with Doorway  - 3 x daily - 7 x weekly - 1 sets - 10 reps - 5 hold - Standing Isometric Shoulder Flexion with Doorway - Arm Bent  - 1 x daily - 7 x weekly - 3 sets - 10 reps - Sidelying Shoulder External Rotation  - 1 x daily - 7 x weekly - 3 sets - 10 reps   ASTERISK SIGNS     Asterisk Signs Eval (04/25/2022)            Shoulder flexion 85 w/ P!            Shoulder flexion MMT 3 w/ P!            Max pain  10/10                                            [facility] Adult PT Treatment:                                                DATE: 05/21/2022 Therapeutic Exercise: UBE - L2 - 2.5'/2.5' S/L ER - 3x10 S/L abduction - non  painful arc - 3x10 (~90 degrees) GTB row - 3x10 Shoulder extension GTB  Manual Therapy: *** Neuromuscular re-ed: *** Therapeutic Activity: *** Modalities: *** Self Care: ***   Today's Treatment 11/29:  Therapeutic Exercise: - UBE - L2 - 2.5'/2.5' - S/L ER - 3x10 - S/L abduction - non  painful arc - 3x10 (~90 degrees) - GTB row - 3x10  Manual Therapy: - Flexion and ER - G  III - IV AP GH joint mobilization with light distraction   ASSESSMENT:   CLINICAL IMPRESSION: ***  [name] tolerated session well with no adverse reaction.  AP GH joint mobilization significantly improved ER ROM suggesting posterior capsule tightness.  HE updated.   OBJECTIVE IMPAIRMENTS: Pain, shoulder ROM, shoulder strength   ACTIVITY LIMITATIONS: lifting, reaching, housework   PERSONAL FACTORS: See medical history and pertinent history     REHAB POTENTIAL: Good   CLINICAL DECISION MAKING:  Stable/uncomplicated   EVALUATION COMPLEXITY: Low     GOALS:     SHORT TERM GOALS: Target date: 05/23/2022   [name] will be >75% HEP compliant to improve carryover between sessions and facilitate independent management of condition   Evaluation (04/25/2022): ongoing Goal status: INITIAL     LONG TERM GOALS: Target date: 06/20/2022   [name] will show a >/= 11 pt improvement in her QUICK DASH score as a proxy for functional improvement    Evaluation/Baseline (04/25/2022): 34 pts Goal status: INITIAL     2.  Jaylnn will self report >/= 50% decrease in pain from evaluation    Evaluation/Baseline (04/25/2022): 10/10 max pain Goal status: INITIAL     3.  Khaya will demonstrate >125 degrees of active ROM in flexion to allow completion of activities involving reaching [place], not limited by pain   Evaluation/Baseline (04/25/2022): 85 degrees w/ pain Goal status: INITIAL       4.  Prakriti will improve the following MMTs to >/= 4/5 to show improvement in strength:  ER and Flexion    Evaluation/Baseline (04/25/2022): see chart in note Goal status: INITIAL     5.  Javiana will be able to reach into cabinet to put away light objects, not limited by pain   Evaluation/Baseline (04/25/2022): limited Goal status: INITIAL s   PLAN: PT FREQUENCY: 1-2x/week   PT DURATION: 8 weeks (Ending 06/20/2022)   PLANNED INTERVENTIONS: Therapeutic exercises, Aquatic therapy, Therapeutic activity, Neuro Muscular re-education, Gait training, Patient/Family education, Joint mobilization, Dry Needling, Electrical stimulation, Spinal mobilization and/or manipulation, Moist heat, Taping, Vasopneumatic device, Ionotophoresis '4mg'$ /ml Dexamethasone, and Manual therapy   PLAN FOR NEXT SESSION: GH joint mobilization progressive R/C and periscapular strengthening.  Lymphedema massage?    Margarette Canada PTA 05/19/2022, 11:12 AM

## 2022-05-21 ENCOUNTER — Ambulatory Visit: Payer: Medicaid Other

## 2022-05-23 ENCOUNTER — Ambulatory Visit: Payer: Medicaid Other | Admitting: Physical Therapy

## 2022-05-28 ENCOUNTER — Encounter: Payer: Medicaid Other | Admitting: Physical Therapy

## 2022-06-12 ENCOUNTER — Ambulatory Visit (INDEPENDENT_AMBULATORY_CARE_PROVIDER_SITE_OTHER): Payer: Medicaid Other | Admitting: Internal Medicine

## 2022-06-12 ENCOUNTER — Encounter: Payer: Self-pay | Admitting: Internal Medicine

## 2022-06-12 VITALS — BP 100/68 | HR 85 | Ht 63.25 in | Wt 201.8 lb

## 2022-06-12 DIAGNOSIS — E890 Postprocedural hypothyroidism: Secondary | ICD-10-CM | POA: Diagnosis not present

## 2022-06-12 DIAGNOSIS — E559 Vitamin D deficiency, unspecified: Secondary | ICD-10-CM | POA: Diagnosis not present

## 2022-06-12 DIAGNOSIS — E041 Nontoxic single thyroid nodule: Secondary | ICD-10-CM

## 2022-06-12 LAB — TSH: TSH: 1.34 u[IU]/mL (ref 0.35–5.50)

## 2022-06-12 LAB — VITAMIN D 25 HYDROXY (VIT D DEFICIENCY, FRACTURES): VITD: 33.1 ng/mL (ref 30.00–100.00)

## 2022-06-12 LAB — T4, FREE: Free T4: 1.34 ng/dL (ref 0.60–1.60)

## 2022-06-12 NOTE — Patient Instructions (Signed)
Please stop at the lab.  Continue Synthroid 100 mcg daily.  Take the thyroid hormone every day, with water, at least 30 minutes before breakfast, separated by at least 4 hours from: - acid reflux medications - calcium - iron - multivitamins  For now, continue vitamin D 5000 units daily.  Please come back for a follow-up appointment in 1 year.

## 2022-06-12 NOTE — Progress Notes (Addendum)
Patient ID: [name], female   DOB: 27-Aug-1960, 62 y.o.   MRN: [id number]   HPI  [name] is a 62 y.o.-year-old female, initially referred by her PCP, Dr. Quillian Quince, returning for follow-up for thyroid nodule, post ablative hypothyroidism, vitamin D deficiency.  Last visit 1 year ago. She is late 30 minutes before her appointment today (son was late to pick her up).  Interim history: She has no complaints today other than hoarseness, which is chronic for her.    Left thyroid nodule -Diagnosed in 2015  Reviewed prev. Investigations: She sees ENT (Dr. Redmond Baseman) after her neck Sx in 1997. 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 FNA (09/15/2013): AUS FNA (01/14/2014): FLUS Thyroid U/S (10/04/2017): Complex left lobe nodule 1 measures 4.9 x 3.9 x 3.9 cm and previously measured 3.6 x 2.9 x 3.0 cm. Biopsy has been performed previously. I referred her for alcohol instillation in the cyst (Dr. Kathlene Cote): FNA + EtOH instillation (03/31/2018):  Scant ep. cells  Thyroid U/S (02/04/2021): Parenchymal Echotexture: Mildly heterogenous Isthmus: 0.2 cm Right lobe: 2.0 x 0.8 x 0.8 cm Left lobe: 4.9 x 3.4 x 3.4 cm _________________________________________________________   Again seen is a large solid and cystic nodule involving almost the entirety of the left thyroid lobe measuring 4.9 x 3.4 x 3.4 cm, previously 4.9 x 4.0 x 3.9 cm. This nodule was previously biopsied in 2015.   IMPRESSION: Stable appearance of a large left thyroid nodule measuring up to 4.9 cm, essentially unchanged since 2019. This nodule was previously biopsied in 2015, correlate with biopsy results.  Pt denies: - feeling nodules in neck - dysphagia - choking - SOB with lying down She has chronic hoarseness.  Hypothyroidism: -postablation -not well controlled.  In the last 1.5 years, we have been decreasing the dose of levothyroxine from 175 to 112 mcg daily.  However, at last visit, she was given 125 mcg from the pharmacy. We then decreased back to 112 mcg daily, then to 100 mcg daily.  She takes Synthroid DAW -as she had choking with the generic levothyroxine in the past (although, upon questioning, she is not sure whether she is given the brand name from the pharmacy) -choking improved. - missed 1 tab in the last - in am - fasting - at least 30 min from b'fast - no calcium - no iron - no multivitamins - + PPIs >4h after LT4 - not on Biotin  Reviewed her TFTs: Lab Results  Component Value Date   TSH 3.07 04/12/2021   TSH 3.09 07/07/2020   TSH 3.39 03/22/2020   TSH 0.11 (L) 03/20/2019   TSH 0.112 (L) 09/06/2018   TSH 0.13 (L) 04/28/2018   TSH <0.01 Repeated and verified X2. (L) 03/10/2018   TSH 0.088 (L) 01/01/2018   TSH 42.838 (H) 07/03/2017   FREET4 1.03 04/12/2021   FREET4 1.07 07/07/2020   FREET4 0.95 03/22/2020   FREET4 1.45 03/20/2019   FREET4 1.21 04/28/2018   FREET4 1.58 03/10/2018    She continues to have hot flushes.  She has a family history of thyroid disease in her sister. No FH of thyroid cancer. No h/o radiation tx to head or neck. No herbal supplements. No Biotin use. No recent steroids use.   She has a history of breast cancer diagnosed in 44- sees Dr. Marin Olp. She also has a history of HTN.   She also has vit D def. >> previously on 1000 units  vitamin D daily >> increased to 4000 units vitamin D daily >> on 5000 units now - not consistently.  Reviewed vitamin levels: Lab Results  Component Value Date   VD25OH 24.06 (L) 04/12/2021   VD25OH 14.9 (L) 07/07/2020   VD25OH 17.2 (L) 01/01/2018   Lab Results  Component Value Date   [id number] 07/07/2020     ROS: + See HPI Neurological: no tremors/no numbness/+ tingling and burning/no dizziness/+ paresthesias - was on Gabapentin before  I reviewed pt's medications, allergies, PMH, social hx, family hx, and changes were documented in the history of  present illness. Otherwise, unchanged from my initial visit note.  Past Medical History:  Diagnosis Date   Acid reflux    Cancer ([place]) 1995   breast cancer   Essential hypertension 02/16/2015   Hypertension    Obesity 02/16/2015   Post-operative nausea and vomiting    Thyroid disease    Vertigo    Past Surgical History:  Procedure Laterality Date   ABDOMINAL HYSTERECTOMY     ESOPHAGEAL DILATION  2006?   Dr.Hayes   MASS EXCISION  1995   thymus gland & LEFT VOCAL CORD   MASTECTOMY Left 2007   MASTECTOMY     Social History   Socioeconomic History   Marital status: Single    Spouse name: Not on file   Number of children: Not on file   Years of education: Not on file   Highest education level: Not on file  Occupational History   Not on file  Tobacco Use   Smoking status: Never   Smokeless tobacco: Never   Tobacco comments:    never used tobacco  Vaping Use   Vaping Use: Never used  Substance and Sexual Activity   Alcohol use: No   Drug use: No   Sexual activity: Yes    Birth control/protection: Surgical    Comment: hysterectomy  Other Topics Concern   Not on file  Social History Narrative   Not on file   Social Determinants of Health   Financial Resource Strain: Low Risk  (02/27/2021)   Overall Financial Resource Strain (CARDIA)    Difficulty of Paying Living Expenses: Not hard at all  Food Insecurity: No Food Insecurity (02/27/2021)   Hunger Vital Sign    Worried About Running Out of Food in the Last Year: Never true    Remer in the Last Year: Never true  Transportation Needs: No Transportation Needs (02/27/2021)   PRAPARE - Hydrologist (Medical): No    Lack of Transportation (Non-Medical): No  Physical Activity: Insufficiently Active (02/27/2021)   Exercise Vital Sign    Days of Exercise per Week: 3 days    Minutes of Exercise per Session: 30 min  Stress: No Stress Concern Present (02/27/2021)   [place]    Feeling of Stress : Not at all  Social Connections: Moderately Isolated (02/27/2021)   Social Connection and Isolation Panel [NHANES]    Frequency of Communication with Friends and Family: More than three times a week    Frequency of Social Gatherings with Friends and Family: More than three times a week    Attends Religious Services: More than 4 times per year    Active Member of Clubs or Organizations: No    Attends Archivist Meetings: Never    Marital Status: Never married  Intimate Partner Violence: Not At Risk (02/27/2021)  Humiliation, Afraid, Rape, and Kick questionnaire    Fear of Current or Ex-Partner: No    Emotionally Abused: No    Physically Abused: No    Sexually Abused: No   Current Outpatient Medications on File Prior to Visit  Medication Sig Dispense Refill   alum & mag hydroxide-simeth (MAALOX MAX) 400-400-40 MG/5ML suspension Take 5 mLs by mouth every 6 (six) hours as needed for indigestion. 355 mL 0   cholecalciferol (VITAMIN D) 1000 units tablet Take 1,000 Units by mouth daily.     clobetasol cream (TEMOVATE) 6.60 % Apply 1 application topically 2 (two) times daily. 45 g 1   famotidine (PEPCID) 20 MG tablet Take 1 tablet (20 mg total) by mouth 2 (two) times daily. 30 tablet 0   lisinopril-hydrochlorothiazide (PRINZIDE,ZESTORETIC) 10-12.5 MG per tablet Take 1 tablet by mouth daily. 30 tablet 2   pantoprazole (PROTONIX) 40 MG tablet Take 40 mg by mouth 2 (two) times daily.     polyethylene glycol powder (GLYCOLAX/MIRALAX) 17 GM/SCOOP powder Use 17 grams once a day 255 g 3   PROVENTIL HFA 108 (90 Base) MCG/ACT inhaler Inhale 2 puffs into the lungs every 4 (four) hours as needed for wheezing or shortness of breath.   3   sucralfate (CARAFATE) 1 GM/10ML suspension Take 10 mLs (1 g total) by mouth 4 (four) times daily -  with meals and at bedtime. 420 mL 0   SYNTHROID 100 MCG tablet TAKE 1 TABLET BY MOUTH  ONCE DAILY BEFORE BREAKFAST 30 tablet 0   Current Facility-Administered Medications on File Prior to Visit  Medication Dose Route Frequency Provider Last Rate Last Admin   Alcohol (DEHYDRATED ALCOHOL 98%) 98 % injection SOLN 5 mL  5 mL Intravenous Once Docia Barrier, PA       Allergies  Allergen Reactions   Aspirin Swelling   Elemental Sulfur Nausea Only   Family History  Problem Relation Age of Onset   Diabetes Father    Asthma Mother    Thyroid disease Sister    Breast cancer Paternal Aunt    Stomach cancer Paternal Aunt    Colon cancer Neg Hx    Colon polyps Neg Hx    Esophageal cancer Neg Hx    Rectal cancer Neg Hx     PE: BP 100/68 (BP Location: Right Arm, Patient Position: Sitting, Cuff Size: Normal)   Pulse 85   Ht 5' 3.25" (1.607 m)   Wt 201 lb 12.8 oz (91.5 kg)   SpO2 95%   BMI 35.47 kg/m  Wt Readings from Last 3 Encounters:  06/12/22 201 lb 12.8 oz (91.5 kg)  07/18/21 197 lb (89.4 kg)  07/08/21 197 lb (89.4 kg)   Constitutional: overweight, in NAD Eyes: EOMI, no exophthalmos ENT:  no thyromegaly, + Palpable left neck fullness, no cervical lymphadenopathy Cardiovascular: RRR, No MRG Respiratory: CTA B Musculoskeletal: no deformities Skin: no rashes Neurological: no tremor with outstretched hands  ASSESSMENT: 1. Thyroid nodule  2.  Hypothyroidism  3.  Tingling in the extremities  PLAN: 1. Thyroid nodule -Patient with history of a large left dominant thyroid nodule, almost entirely cystic, s/p drainage of the cyst and ethanol instillation in 03/2018 since previous aspirations did not prevent reforming of the cyst.  Per review of the thyroid ultrasound images, the nodule did not show any concerning features: No calcifications, no internal blood flow, no taller than wide distribution and also no infiltration in the surrounding tissue.  Also, she does not have a  thyroid cancer family history or personal history of radiation therapy to head or  neck.  All these would favor benignity.  The previous biopsies were inconclusive most likely due to the cystic nature of the nodule. -In the past, she had occasional choking, but not recently it was unclear whether this was related to the thyroid nodule or not. -No neck compression symptoms.  She has hoarseness, which is chronic for her after her sinus surgery, with occasional exacerbations. -Before last visit, we checked another thyroid ultrasound that showed that her thyroid cyst was stable or slightly smaller but without changes in ultrasound characteristics. No other nodules or abnormalities were identified. -We will recheck her thyroid ultrasound now -I will see her back in 1 year  2. Hypothyroidism - latest thyroid labs reviewed with pt. >> normal: Lab Results  Component Value Date   TSH 3.07 04/12/2021  - she continues on LT4 100 mcg daily (Synthroid brand name as she has choking on the generic levothyroxine in the past) - pt feels good on this dose. - we discussed about taking the thyroid hormone every day, with water, >30 minutes before breakfast, separated by >4 hours from acid reflux medications, calcium, iron, multivitamins. Pt. is taking it correctly. - will check thyroid tests today: TSH and fT4 - If labs are abnormal, she will need to return for repeat TFTs in 1.5 months  3.  Vitamin D deficiency -At last visit, we checked a B12 and vitamin D level due to tingling in extremities.  Vitamin D was very low, at 14.9.  -We increased her supplements afterwards.  At last visit, she was telling me that she was taking 5000 units daily but missed doses.  At that time, vitamin D was low, at 24.06 -We discussed about taking the vitamin D level every day and not miss doses.  However, at today's visit, she tells me that she is taking the dosing consistently. -We will recheck the level today   Component     Latest Ref Rng 06/12/2022  TSH     0.35 - 5.50 uIU/mL 1.34   T4,Free(Direct)      0.60 - 1.60 ng/dL 1.34   VITD     30.00 - 100.00 ng/mL 33.10   Normal labs.  Thyroid U/S (06/29/2022): Parenchymal Echotexture: Markedly heterogenous  Isthmus: 0.2 cm  Right lobe: 2.7 x 0.5 x 0.7 cm Left lobe: 4.6 x 3.2 x 3.4 cm  _________________________________________________________   Estimated total number of nodules >/= 1 cm: 1 _________________________________________________________   Nodule # 1: Solitary nodule occupying the majority of the left mid gland remains mixed cystic and solid. Today, the nodule measures 4.2 x 3.6 x 3.1 cm slightly smaller than 4.9 x 3.4 x 3.4 cm previously. No new nodules or suspicious features are identified.   IMPRESSION: Continued stability of previously biopsied nodule in the left mid gland. Five year stability is consistent with benignity. No further imaging follow-up is recommended.   No new nodules or suspicious features.   Msg sent: Dear Ms. Lianne Cure news: The left thyroid nodule appears to be slightly smaller in size compared to before.  No further imaging follow-up  is needed for this nodule. Sincerely, Philemon Kingdom MD  Philemon Kingdom, MD PhD [facility]

## 2022-06-29 ENCOUNTER — Ambulatory Visit
Admission: RE | Admit: 2022-06-29 | Discharge: 2022-06-29 | Disposition: A | Discharge status: Home / Self Care | Payer: Medicaid Other | Source: Ambulatory Visit | Attending: Internal Medicine | Admitting: Internal Medicine

## 2022-06-29 DIAGNOSIS — E041 Nontoxic single thyroid nodule: Secondary | ICD-10-CM

## 2022-07-06 ENCOUNTER — Encounter: Payer: Self-pay | Admitting: Hematology & Oncology

## 2022-07-06 ENCOUNTER — Inpatient Hospital Stay (HOSPITAL_BASED_OUTPATIENT_CLINIC_OR_DEPARTMENT_OTHER): Payer: Medicaid Other | Admitting: Hematology & Oncology

## 2022-07-06 ENCOUNTER — Inpatient Hospital Stay: Payer: Medicaid Other | Attending: Hematology & Oncology

## 2022-07-06 VITALS — BP 124/71 | HR 80 | Temp 98.1°F | Resp 20 | Ht 64.0 in | Wt 203.0 lb

## 2022-07-06 DIAGNOSIS — E041 Nontoxic single thyroid nodule: Secondary | ICD-10-CM

## 2022-07-06 DIAGNOSIS — C50912 Malignant neoplasm of unspecified site of left female breast: Secondary | ICD-10-CM

## 2022-07-06 DIAGNOSIS — Z923 Personal history of irradiation: Secondary | ICD-10-CM | POA: Insufficient documentation

## 2022-07-06 DIAGNOSIS — Z853 Personal history of malignant neoplasm of breast: Secondary | ICD-10-CM | POA: Diagnosis not present

## 2022-07-06 LAB — CBC WITH DIFFERENTIAL (CANCER CENTER ONLY)
Abs Immature Granulocytes: 0.01 10*3/uL (ref 0.00–0.07)
Basophils Absolute: 0 10*3/uL (ref 0.0–0.1)
Basophils Relative: 0 %
Eosinophils Absolute: 0.1 10*3/uL (ref 0.0–0.5)
Eosinophils Relative: 2 %
HCT: 39.5 % (ref 36.0–46.0)
Hemoglobin: 12.7 g/dL (ref 12.0–15.0)
Immature Granulocytes: 0 %
Lymphocytes Relative: 42 %
Lymphs Abs: 2 10*3/uL (ref 0.7–4.0)
MCH: 27 pg (ref 26.0–34.0)
MCHC: 32.2 g/dL (ref 30.0–36.0)
MCV: 84 fL (ref 80.0–100.0)
Monocytes Absolute: 0.3 10*3/uL (ref 0.1–1.0)
Monocytes Relative: 7 %
Neutro Abs: 2.4 10*3/uL (ref 1.7–7.7)
Neutrophils Relative %: 49 %
Platelet Count: 235 10*3/uL (ref 150–400)
RBC: 4.7 MIL/uL (ref 3.87–5.11)
RDW: 13.2 % (ref 11.5–15.5)
WBC Count: 4.9 10*3/uL (ref 4.0–10.5)
nRBC: 0 % (ref 0.0–0.2)

## 2022-07-06 LAB — CMP (CANCER CENTER ONLY)
ALT: 16 U/L (ref 0–44)
AST: 14 U/L — ABNORMAL LOW (ref 15–41)
Albumin: 4.5 g/dL (ref 3.5–5.0)
Alkaline Phosphatase: 76 U/L (ref 38–126)
Anion gap: 7 (ref 5–15)
BUN: 18 mg/dL (ref 6–20)
CO2: 29 mmol/L (ref 22–32)
Calcium: 9.4 mg/dL (ref 8.9–10.3)
Chloride: 102 mmol/L (ref 98–111)
Creatinine: 0.87 mg/dL (ref 0.44–1.00)
GFR, Estimated: >60 mL/min (ref >60)
Glucose, Bld: 116 mg/dL — ABNORMAL HIGH (ref 70–99)
Potassium: 4.2 mmol/L (ref 3.5–5.1)
Sodium: 138 mmol/L (ref 135–145)
Total Bilirubin: 0.4 mg/dL (ref 0.3–1.2)
Total Protein: 7.5 g/dL (ref 6.5–8.1)

## 2022-07-06 LAB — TSH: TSH: 1.409 u[IU]/mL (ref 0.350–4.500)

## 2022-07-06 LAB — LACTATE DEHYDROGENASE: LDH: 145 U/L (ref 98–192)

## 2022-07-06 NOTE — Progress Notes (Signed)
Hematology and Oncology Follow Up Visit  KHALIA GONG [id number] 09-13-1960 62 y.o. 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 Chancellor is back for a follow-up.  We see her yearly.  So far, she has done quite nicely.  She had her 61th birthday last year.  She did not really have much of a celebration.  She is always a very private person.  She still is having problems with her left arm.  I am unsure if she does have shoulder issues.  I think she has been seen by Orthopedic Surgery.  I will have to see what they say.  She has had no change in bowel or bladder habits.  She has had no cough or shortness of breath.  There has been no nausea or vomiting.  She is trying to lose some weight.  I am sure that she will lose weight.  She has had no leg swelling.  She has had no bleeding.  She did see orthopedic surgery for the left shoulder.  I know this is bothered her.  I think she took some physical therapy.  She was told that she has some tendinitis and bursitis.  She does not need surgery.  She did not have any injections.  She recently had a thyroid ultrasound.  She does have some nodules but they are stable and Radiology does not think they need to be biopsied.  Her last mammogram was done on 04/30/2022.  Everything looked fine with the right breast.  Currently, I would say that her performance status is probably ECOG 1.        Medications:  Current Outpatient Medications:    alum & mag hydroxide-simeth (MAALOX MAX) 400-400-40 MG/5ML suspension, Take 5 mLs by mouth every 6 (six) hours as needed for indigestion., Disp: 355 mL, Rfl: 0   cetirizine (ZYRTEC) 10 MG tablet, Take 10 mg by mouth daily as needed., Disp: , Rfl:    cholecalciferol (VITAMIN D) 1000 units tablet, Take 1,000 Units by mouth daily., Disp: , Rfl:     clobetasol cream (TEMOVATE) 6.43 %, Apply 1 application topically 2 (two) times daily., Disp: 45 g, Rfl: 1   famotidine (PEPCID) 20 MG tablet, Take 1 tablet (20 mg total) by mouth 2 (two) times daily., Disp: 30 tablet, Rfl: 0   lisinopril-hydrochlorothiazide (PRINZIDE,ZESTORETIC) 10-12.5 MG per tablet, Take 1 tablet by mouth daily., Disp: 30 tablet, Rfl: 2   omeprazole (PRILOSEC) 20 MG capsule, Take 20 mg by mouth 2 (two) times daily., Disp: , Rfl:    SYNTHROID 100 MCG tablet, TAKE 1 TABLET BY MOUTH ONCE DAILY BEFORE BREAKFAST, Disp: 30 tablet, Rfl: 0   fluticasone (FLONASE) 50 MCG/ACT nasal spray, Place 2 sprays into both nostrils daily. (Patient not taking: Reported on 07/06/2022), Disp: , Rfl:    meclizine (ANTIVERT) 12.5 MG tablet, Take 12.5 mg by mouth 3 (three) times daily as needed. (Patient not taking: Reported on 07/06/2022), Disp: , Rfl:    polyethylene glycol powder (GLYCOLAX/MIRALAX) 17 GM/SCOOP powder, Use 17 grams once a day (Patient not taking: Reported on 07/06/2022), Disp: 255 g, Rfl: 3   PROVENTIL HFA 108 (90 Base) MCG/ACT inhaler, Inhale 2 puffs into the lungs every 4 (four) hours as needed for wheezing or shortness of breath.  (Patient not taking: Reported on 07/06/2022), Disp: , Rfl: 3   sucralfate (CARAFATE) 1  GM/10ML suspension, Take 10 mLs (1 g total) by mouth 4 (four) times daily -  with meals and at bedtime. (Patient not taking: Reported on 07/06/2022), Disp: 420 mL, Rfl: 0 No current facility-administered medications for this visit.  Facility-Administered Medications Ordered in Other Visits:    Alcohol (DEHYDRATED ALCOHOL 98%) 98 % injection SOLN 5 mL, 5 mL, Intravenous, Once, Zigmund Daniel, Herma Carson, PA  Allergies:  Allergies  Allergen Reactions   Aspirin Swelling   Elemental Sulfur Nausea Only    Past Medical History, Surgical history, Social history, and Family History were reviewed and updated.  Review of Systems: Review of Systems  Constitutional: Negative.    HENT:  Negative.    Eyes: Negative.   Respiratory:  Positive for shortness of breath.   Cardiovascular: Negative.   Gastrointestinal: Negative.   Endocrine: Negative.   Genitourinary: Negative.    Musculoskeletal:  Positive for myalgias.  Skin: Negative.   Neurological: Negative.   Hematological: Negative.   Psychiatric/Behavioral: Negative.      Physical Exam:  height is '5\' 4"'$  (1.626 m) and weight is 203 lb 0.6 oz (92.1 kg). Her oral temperature is 98.1 F (36.7 C). Her blood pressure is 124/71 and her pulse is 80. Her respiration is 20 and oxygen saturation is 100%.   Wt Readings from Last 3 Encounters:  07/06/22 203 lb 0.6 oz (92.1 kg)  06/12/22 201 lb 12.8 oz (91.5 kg)  07/18/21 197 lb (89.4 kg)    Physical Exam Vitals reviewed.  HENT:     Head: Normocephalic and atraumatic.  Eyes:     Pupils: Pupils are equal, round, and reactive to light.  Cardiovascular:     Rate and Rhythm: Normal rate and regular rhythm.     Heart sounds: Normal heart sounds.  Pulmonary:     Effort: Pulmonary effort is normal.     Breath sounds: Normal breath sounds.  Abdominal:     General: Bowel sounds are normal.     Palpations: Abdomen is soft.  Musculoskeletal:        General: No tenderness or deformity. Normal range of motion.     Cervical back: Normal range of motion.  Lymphadenopathy:     Cervical: No cervical adenopathy.  Skin:    General: Skin is warm and dry.     Findings: No erythema or rash.  Neurological:     Mental Status: She is alert and oriented to person, place, and time.  Psychiatric:        Behavior: Behavior normal.        Thought Content: Thought content normal.        Judgment: Judgment normal.      Lab Results  Component Value Date   WBC 4.9 07/06/2022   HGB 12.7 07/06/2022   HCT 39.5 07/06/2022   MCV 84.0 07/06/2022   PLT 235 07/06/2022     Chemistry      Component Value Date/Time   NA 138 07/06/2022 0927   NA 139 12/15/2014 0834   K 4.2  07/06/2022 0927   K 4.1 12/15/2014 0834   CL 102 07/06/2022 0927   CL 101 10/28/2013 1150   CO2 29 07/06/2022 0927   CO2 22 12/15/2014 0834   BUN 18 07/06/2022 0927   BUN 13.0 12/15/2014 0834   CREATININE 0.87 07/06/2022 0927   CREATININE 0.8 12/15/2014 0834      Component Value Date/Time   CALCIUM 9.4 07/06/2022 0927   CALCIUM 9.0 12/15/2014 0834   ALKPHOS 76  07/06/2022 0927   ALKPHOS 81 12/15/2014 0834   AST 14 (L) 07/06/2022 0927   AST 16 12/15/2014 0834   ALT 16 07/06/2022 0927   ALT 13 12/15/2014 0834   BILITOT 0.4 07/06/2022 0927   BILITOT 0.31 12/15/2014 0834      Impression and Plan: Ms. Uhlig is a 62 year old postmenopausal African-American female.  She has had a remote history of recurrent adenocarcinoma of the left breast.  She underwent radiation for this recurrence after surgery.  As far as her breast cancer is concerned, I really doubt that there is a problem with this.  We will go ahead and plan to see her back in another year, as always.     Volanda Napoleon, MD 1/26/202411:06 AM

## 2023-01-23 ENCOUNTER — Emergency Department (HOSPITAL_BASED_OUTPATIENT_CLINIC_OR_DEPARTMENT_OTHER)
Admission: EM | Admit: 2023-01-23 | Discharge: 2023-01-23 | Disposition: A | Discharge status: Home / Self Care | Payer: Medicaid Other | Attending: Emergency Medicine | Admitting: Emergency Medicine

## 2023-01-23 ENCOUNTER — Emergency Department (HOSPITAL_BASED_OUTPATIENT_CLINIC_OR_DEPARTMENT_OTHER): Payer: Medicaid Other | Admitting: Radiology

## 2023-01-23 ENCOUNTER — Other Ambulatory Visit: Payer: Self-pay

## 2023-01-23 ENCOUNTER — Encounter (HOSPITAL_BASED_OUTPATIENT_CLINIC_OR_DEPARTMENT_OTHER): Payer: Self-pay

## 2023-01-23 DIAGNOSIS — L03114 Cellulitis of left upper limb: Secondary | ICD-10-CM | POA: Insufficient documentation

## 2023-01-23 DIAGNOSIS — Z853 Personal history of malignant neoplasm of breast: Secondary | ICD-10-CM | POA: Diagnosis not present

## 2023-01-23 DIAGNOSIS — Z79899 Other long term (current) drug therapy: Secondary | ICD-10-CM | POA: Diagnosis not present

## 2023-01-23 DIAGNOSIS — D72829 Elevated white blood cell count, unspecified: Secondary | ICD-10-CM | POA: Insufficient documentation

## 2023-01-23 DIAGNOSIS — M25522 Pain in left elbow: Secondary | ICD-10-CM | POA: Diagnosis present

## 2023-01-23 DIAGNOSIS — R110 Nausea: Secondary | ICD-10-CM | POA: Diagnosis not present

## 2023-01-23 DIAGNOSIS — R748 Abnormal levels of other serum enzymes: Secondary | ICD-10-CM | POA: Diagnosis not present

## 2023-01-23 DIAGNOSIS — I10 Essential (primary) hypertension: Secondary | ICD-10-CM | POA: Diagnosis not present

## 2023-01-23 DIAGNOSIS — Z20822 Contact with and (suspected) exposure to covid-19: Secondary | ICD-10-CM | POA: Insufficient documentation

## 2023-01-23 DIAGNOSIS — R000 Tachycardia, unspecified: Secondary | ICD-10-CM | POA: Diagnosis not present

## 2023-01-23 LAB — CBC
HCT: 38.8 % (ref 36.0–46.0)
Hemoglobin: 13.1 g/dL (ref 12.0–15.0)
MCH: 27.5 pg (ref 26.0–34.0)
MCHC: 33.8 g/dL (ref 30.0–36.0)
MCV: 81.3 fL (ref 80.0–100.0)
Platelets: 248 10*3/uL (ref 150–400)
RBC: 4.77 MIL/uL (ref 3.87–5.11)
RDW: 13.4 % (ref 11.5–15.5)
WBC: 12.6 10*3/uL — ABNORMAL HIGH (ref 4.0–10.5)
nRBC: 0 % (ref 0.0–0.2)

## 2023-01-23 LAB — URINALYSIS, ROUTINE W REFLEX MICROSCOPIC
Bilirubin Urine: NEGATIVE
Glucose, UA: NEGATIVE mg/dL
Hgb urine dipstick: NEGATIVE
Ketones, ur: NEGATIVE mg/dL
Leukocytes,Ua: NEGATIVE
Nitrite: NEGATIVE
Protein, ur: NEGATIVE mg/dL
Specific Gravity, Urine: 1.014 (ref 1.005–1.030)
pH: 8 (ref 5.0–8.0)

## 2023-01-23 LAB — RESP PANEL BY RT-PCR (RSV, FLU A&B, COVID)  RVPGX2
Influenza A by PCR: NEGATIVE
Influenza B by PCR: NEGATIVE
Resp Syncytial Virus by PCR: NEGATIVE
SARS Coronavirus 2 by RT PCR: NEGATIVE

## 2023-01-23 LAB — LIPASE, BLOOD: Lipase: 63 U/L — ABNORMAL HIGH (ref 11–51)

## 2023-01-23 LAB — COMPREHENSIVE METABOLIC PANEL
ALT: 19 U/L (ref 0–44)
AST: 18 U/L (ref 15–41)
Albumin: 4.7 g/dL (ref 3.5–5.0)
Alkaline Phosphatase: 76 U/L (ref 38–126)
Anion gap: 13 (ref 5–15)
BUN: 13 mg/dL (ref 6–20)
CO2: 22 mmol/L (ref 22–32)
Calcium: 10 mg/dL (ref 8.9–10.3)
Chloride: 100 mmol/L (ref 98–111)
Creatinine, Ser: 0.94 mg/dL (ref 0.44–1.00)
GFR, Estimated: >60 mL/min (ref >60)
Glucose, Bld: 137 mg/dL — ABNORMAL HIGH (ref 70–99)
Potassium: 3.7 mmol/L (ref 3.5–5.1)
Sodium: 135 mmol/L (ref 135–145)
Total Bilirubin: 0.6 mg/dL (ref 0.3–1.2)
Total Protein: 8.5 g/dL — ABNORMAL HIGH (ref 6.5–8.1)

## 2023-01-23 LAB — TSH: TSH: 0.487 u[IU]/mL (ref 0.350–4.500)

## 2023-01-23 LAB — PROTIME-INR
INR: 1 (ref 0.8–1.2)
Prothrombin Time: 13.1 seconds (ref 11.4–15.2)

## 2023-01-23 LAB — LACTIC ACID, PLASMA: Lactic Acid, Venous: 1.8 mmol/L (ref 0.5–1.9)

## 2023-01-23 LAB — TROPONIN I (HIGH SENSITIVITY): Troponin I (High Sensitivity): 2 ng/L (ref <18)

## 2023-01-23 MED ORDER — LACTATED RINGERS IV BOLUS
1000.0000 mL | Freq: Once | INTRAVENOUS | Status: AC
  Administered 2023-01-23: 1000 mL via INTRAVENOUS

## 2023-01-23 MED ORDER — ACETAMINOPHEN 500 MG PO TABS: 500.0000 mg | ORAL_TABLET | Freq: Four times a day (QID) | ORAL | 0 refills | Status: AC | PRN

## 2023-01-23 MED ORDER — ONDANSETRON HCL 4 MG/2ML IJ SOLN
4.0000 mg | Freq: Once | INTRAMUSCULAR | Status: AC
  Administered 2023-01-23: 4 mg via INTRAVENOUS
  Filled 2023-01-23: qty 2

## 2023-01-23 MED ORDER — VANCOMYCIN HCL IN DEXTROSE 1-5 GM/200ML-% IV SOLN
1000.0000 mg | Freq: Once | INTRAVENOUS | Status: AC
  Administered 2023-01-23: 1000 mg via INTRAVENOUS
  Filled 2023-01-23: qty 200

## 2023-01-23 MED ORDER — VANCOMYCIN HCL 2000 MG/400ML IV SOLN: 2000.0000 mg | Freq: Once | INTRAVENOUS | Status: DC

## 2023-01-23 MED ORDER — VANCOMYCIN HCL IN DEXTROSE 1-5 GM/200ML-% IV SOLN
1000.0000 mg | Freq: Once | INTRAVENOUS | Status: DC
  Filled 2023-01-23: qty 200

## 2023-01-23 MED ORDER — ONDANSETRON HCL 4 MG PO TABS: 4.0000 mg | ORAL_TABLET | Freq: Three times a day (TID) | ORAL | 0 refills | Status: AC | PRN

## 2023-01-23 MED ORDER — VANCOMYCIN HCL IN DEXTROSE 1-5 GM/200ML-% IV SOLN
1000.0000 mg | Freq: Once | INTRAVENOUS | Status: AC
  Administered 2023-01-23: 1000 mg via INTRAVENOUS

## 2023-01-23 MED ORDER — DOXYCYCLINE HYCLATE 100 MG PO CAPS: 100.0000 mg | ORAL_CAPSULE | Freq: Two times a day (BID) | ORAL | 0 refills | Status: DC

## 2023-01-23 MED ORDER — ACETAMINOPHEN 500 MG PO TABS
1000.0000 mg | ORAL_TABLET | Freq: Once | ORAL | Status: AC
  Administered 2023-01-23: 1000 mg via ORAL
  Filled 2023-01-23: qty 2

## 2023-01-23 NOTE — ED Notes (Signed)
x2 Blood cultures collected before IV antibiotics started.

## 2023-01-23 NOTE — ED Provider Notes (Addendum)
[place] EMERGENCY DEPARTMENT AT [facility] Provider Note   CSN: [id number] Arrival date & time: 01/23/23  0913     History  Chief Complaint  Patient presents with   Nausea    [name] is a 62 y.o. female.  The history is provided by the patient, medical records and the EMS personnel. No language interpreter was used.     62 year old female with significant history of breast cancer status post mastectomy, hypertension, obesity, thyroid nodule brought here via EMS with concerns of arm pain.  Patient states she accidentally struck her left elbow against a door yesterday while walking.  Since then she noticed progressive worsening pain about her left arm that moves throughout the left side of her body.  She also endorsed bouts of nausea without vomiting and now having fever and chills.  She endorsed some mild congestion and occasional cough but attributed to her indigestion from her acid reflux.  States this is not unusual for her.  She did try to take Tylenol last night for her symptoms without relief.  She denies any recent sick contact denies any ongoing chest pain or shortness of breath no urinary symptoms.  She has lymphedema of her left upper extremity from prior mastectomy but did notice her left arm is more swollen than usual with new redness.  Home Medications Prior to Admission medications   Medication Sig Start Date End Date Taking? Authorizing Provider  alum & mag hydroxide-simeth (MAALOX MAX) 400-400-40 MG/5ML suspension Take 5 mLs by mouth every 6 (six) hours as needed for indigestion. 05/12/21   Caccavale, Sophia, PA-C  cetirizine (ZYRTEC) 10 MG tablet Take 10 mg by mouth daily as needed. 01/18/22   [provider]  cholecalciferol (VITAMIN D) 1000 units tablet Take 1,000 Units by mouth daily.    [provider]  clobetasol cream (TEMOVATE) 0.05 % Apply 1 application topically 2 (two) times daily. 02/27/21   Adline Potter, NP   famotidine (PEPCID) 20 MG tablet Take 1 tablet (20 mg total) by mouth 2 (two) times daily. 12/06/20   Wynetta Fines, MD  fluticasone (FLONASE) 50 MCG/ACT nasal spray Place 2 sprays into both nostrils daily. Patient not taking: Reported on 07/06/2022 06/08/22   [provider]  lisinopril-hydrochlorothiazide (PRINZIDE,ZESTORETIC) 10-12.5 MG per tablet Take 1 tablet by mouth daily. 03/01/15   Josph Macho, MD  meclizine (ANTIVERT) 12.5 MG tablet Take 12.5 mg by mouth 3 (three) times daily as needed. Patient not taking: Reported on 07/06/2022 02/20/22   [provider]  omeprazole (PRILOSEC) 20 MG capsule Take 20 mg by mouth 2 (two) times daily. 05/02/22   [provider]  polyethylene glycol powder (GLYCOLAX/MIRALAX) 17 GM/SCOOP powder Use 17 grams once a day Patient not taking: Reported on 07/06/2022 07/05/21   Sherrilyn Rist, MD  PROVENTIL HFA 108 [phone] Base) MCG/ACT inhaler Inhale 2 puffs into the lungs every 4 (four) hours as needed for wheezing or shortness of breath.  Patient not taking: Reported on 07/06/2022 04/06/18   [provider]  sucralfate (CARAFATE) 1 GM/10ML suspension Take 10 mLs (1 g total) by mouth 4 (four) times daily -  with meals and at bedtime. Patient not taking: Reported on 07/06/2022 05/12/21   Caccavale, Sophia, PA-C  SYNTHROID 100 MCG tablet TAKE 1 TABLET BY MOUTH ONCE DAILY BEFORE BREAKFAST 05/02/22   Carlus Pavlov, MD      Allergies    Aspirin and Elemental sulfur    Review of  Systems   Review of Systems  All other systems reviewed and are negative.   Physical Exam Updated Vital Signs BP (!) 141/89 (BP Location: Right Arm)   Pulse (!) 146   Temp (!) 100.5 F (38.1 C) (Oral)   Resp (!) 26   Ht 5\' 4"  (1.626 m)   Wt 93.4 kg   SpO2 100%   BMI 35.36 kg/m  Physical Exam Vitals and nursing note reviewed.  Constitutional:      Appearance: She is well-developed.     Comments: Patient appears uncomfortable  HENT:      Head: Atraumatic.  Eyes:     Conjunctiva/sclera: Conjunctivae normal.  Cardiovascular:     Rate and Rhythm: Tachycardia present.  Pulmonary:     Effort: Pulmonary effort is normal.     Breath sounds: No wheezing or rhonchi.  Abdominal:     Palpations: Abdomen is soft.  Musculoskeletal:        General: Swelling (Left arm: Nonpitting edema noted throughout the arm with tenderness to palpation of the hand and forearm along with surrounding skin erythema and warmth.  Left elbow nontender with normal flexion and extension.) present.     Cervical back: Normal range of motion and neck supple. No rigidity or tenderness.  Skin:    Findings: No rash.  Neurological:     Mental Status: She is alert and oriented to person, place, and time.  Psychiatric:        Mood and Affect: Mood normal.     ED Results / Procedures / Treatments   Labs (all labs ordered are listed, but only abnormal results are displayed) Labs Reviewed  LIPASE, BLOOD - Abnormal; Notable for the following components:      Result Value   Lipase 63 (*)    All other components within normal limits  COMPREHENSIVE METABOLIC PANEL - Abnormal; Notable for the following components:   Glucose, Bld 137 (*)    Total Protein 8.5 (*)    All other components within normal limits  CBC - Abnormal; Notable for the following components:   WBC 12.6 (*)    All other components within normal limits  RESP PANEL BY RT-PCR (RSV, FLU A&B, COVID)  RVPGX2  CULTURE, BLOOD (ROUTINE X 2)  CULTURE, BLOOD (ROUTINE X 2)  URINALYSIS, ROUTINE W REFLEX MICROSCOPIC  LACTIC ACID, PLASMA  PROTIME-INR  TSH  LACTIC ACID, PLASMA  TROPONIN I (HIGH SENSITIVITY)  TROPONIN I (HIGH SENSITIVITY)    EKG EKG Interpretation Date/Time:  Wednesday January 23 2023 09:19:50 EDT Ventricular Rate:  145 PR Interval:  128 QRS Duration:  88 QT Interval:  353 QTC Calculation: 549 R Axis:   29  Text Interpretation: Sinus tachycardia Nonspecific repol abnormality,  diffuse leads Prolonged QT interval Confirmed by Vonita Moss [phone]) on 01/23/2023 9:46:34 AM  Radiology DG Chest Port 1 View  Result Date: 01/23/2023 CLINICAL DATA:  Neck and left arm pain, swelling. EXAM: PORTABLE CHEST 1 VIEW COMPARISON:  Chest radiograph 07/08/2021 FINDINGS: The cardiomediastinal silhouette is normal Bandlike opacity in the lingula and patchy opacities in the lateral left base are unchanged, favored to reflect scar. Otherwise, there is no focal airspace opacity. There is no pulmonary edema. There is no pleural effusion or pneumothorax. There is no acute osseous abnormality. IMPRESSION: No radiographic evidence of acute cardiopulmonary process. Unchanged scarring in the left lung. Electronically Signed   By: Lesia Hausen M.D.   On: 01/23/2023 11:20    Procedures Procedures    Medications Ordered  in ED Medications  acetaminophen (TYLENOL) tablet 1,000 mg (1,000 mg Oral Given 01/23/23 0942)  lactated ringers bolus 1,000 mL (1,000 mLs Intravenous New Bag/Given 01/23/23 0945)  ondansetron (ZOFRAN) injection 4 mg (4 mg Intravenous Given 01/23/23 0943)  vancomycin (VANCOCIN) IVPB 1000 mg/200 mL premix (0 mg Intravenous Stopped 01/23/23 1047)    And  vancomycin (VANCOCIN) IVPB 1000 mg/200 mL premix (0 mg Intravenous Stopped 01/23/23 1200)    ED Course/ Medical Decision Making/ A&P                                 Medical Decision Making Amount and/or Complexity of Data Reviewed Labs: ordered. Radiology: ordered.  Risk OTC drugs. Prescription drug management.   BP (!) 141/89 (BP Location: Right Arm)   Pulse (!) 146   Temp (!) 100.5 F (38.1 C) (Oral)   Resp (!) 26   Ht 5\' 4"  (1.626 m)   Wt 93.4 kg   SpO2 100%   BMI 35.36 kg/m   69:78 AM 62 year old female with significant history of breast cancer status post mastectomy, hypertension, obesity, thyroid nodule brought here via EMS with concerns of arm pain.  Patient states she accidentally struck her left elbow  against a door yesterday while walking.  Since then she noticed progressive worsening pain about her left arm that moves throughout the left side of her body.  She also endorsed bouts of nausea without vomiting and now having fever and chills.  She endorsed some mild congestion and occasional cough but attributed to her indigestion from her acid reflux.  States this is not unusual for her.  She did try to take Tylenol last night for her symptoms without relief.  She denies any recent sick contact denies any ongoing chest pain or shortness of breath no urinary symptoms.  She has lymphedema of her left upper extremity from prior mastectomy but did notice her left arm is more swollen than usual with new redness.  On exam patient appears uncomfortable and mildly tachypneic.  Exam remarkable for erythema edema and warmth involving her left arm from her forearm down to her hand with tenderness to palpation.  No obvious deformity.  Left elbow nontender.  No signs of trauma.  Radial pulse 2+.  Heart with tachycardia, lungs are clear to auscultation abdomen soft nontender she is able to move all 4 extremities.  Vitals are notable for an oral temperature of 100.5, tachycardia with heart rate of 146, tachypnea with respiratory rate of 26 but no hypoxia and blood pressure is 141/89.  Code sepsis initiated.  Concerns for cellulitis involving left upper extremity.  I have preemptively order vancomycin as treatment.  IV fluid and supportive care provided.  -Labs ordered, independently viewed and interpreted by me.  Labs remarkable for WBC 12.6. suggestive of infection.  Lipase 63, however no abd pain. Normal trop.  -The patient was maintained on a cardiac monitor.  I personally viewed and interpreted the cardiac monitored which showed an underlying rhythm of: sinus tachycardia -Imaging independently viewed and interpreted by me and I agree with radiologist's interpretation.  Result remarkable for CXR unremarkable -This  patient presents to the ED for concern of arm pain, this involves an extensive number of treatment options, and is a complaint that carries with it a high risk of complications and morbidity.  The differential diagnosis includes celllulitis, abscess, strain, sprain, dvt, dissection -Co morbidities that complicate the patient evaluation includes cancer,  HTN -Treatment includes vancomycin, IVF, tylenol and zofran -Reevaluation of the patient after these medicines showed that the patient improved -PCP office notes or outside notes reviewed -Escalation to admission/observation considered: patients feels much better, is comfortable with discharge, and will follow up with PCP -Prescription medication considered, patient comfortable with docycycline, tylenol -Social Determinant of Health considered which includes social isolation, lack of mobility  12:26 PM Initially, patient presents, she has a fever she was tachycardic and tachypneic.  Suspect a lot of that is likely due to ongoing pain of her left arm.  After she received treatment, vital signs normalized.  Workup remarkable for mild leukocytosis but otherwise labs are reassuring.  Initial hospitalization was considered however with improvement of symptoms and patient tolerates p.o., felt patient can go home with antibiotic doxycycline for the cellulitis of her left arm with strict return precaution.  Low suspicion for dissection, or other acute emergent medical condition.  Patient did tolerate p.o. prior to leaving.         Final Clinical Impression(s) / ED Diagnoses Final diagnoses:  Cellulitis of left arm    Rx / DC Orders ED Discharge Orders          Ordered    doxycycline (VIBRAMYCIN) 100 MG capsule  2 times daily        01/23/23 1227    acetaminophen (TYLENOL) 500 MG tablet  Every 6 hours PRN        01/23/23 1227    ondansetron (ZOFRAN) 4 MG tablet  Every 8 hours PRN        01/23/23 1228               Fayrene Helper,  PA-C 01/23/23 1230    Rondel Baton, MD 01/23/23 1627

## 2023-01-23 NOTE — ED Notes (Signed)
Pt given ginger ale per PA approval.

## 2023-01-23 NOTE — Discharge Instructions (Addendum)
You have been evaluated for your symptoms.  You have been diagnosed with cellulitis which is a skin infection involving your left arm.  Please take antibiotic as prescribed.  Take Tylenol as needed for fever and/or bodyaches.  Follow-up closely with your doctor for further care.  If you notice no improvement of your symptoms or if you have any concern please return to the ED for reassessment.

## 2023-01-23 NOTE — ED Triage Notes (Signed)
Present via EMS for nausea and generally not feeling well  States has neck pain and left arm pain with swelling.  States injured arm yesterday.

## 2023-01-28 LAB — CULTURE, BLOOD (ROUTINE X 2)
Culture: NO GROWTH
Culture: NO GROWTH
Special Requests: ADEQUATE
Special Requests: ADEQUATE

## 2023-02-18 ENCOUNTER — Ambulatory Visit (HOSPITAL_COMMUNITY)
Admission: EM | Admit: 2023-02-18 | Discharge: 2023-02-18 | Disposition: A | Discharge status: Home / Self Care | Payer: Medicaid Other | Attending: Family Medicine | Admitting: Family Medicine

## 2023-02-18 ENCOUNTER — Encounter (HOSPITAL_COMMUNITY): Payer: Self-pay

## 2023-02-18 DIAGNOSIS — M79605 Pain in left leg: Secondary | ICD-10-CM

## 2023-02-18 MED ORDER — DEXAMETHASONE SODIUM PHOSPHATE 10 MG/ML IJ SOLN
INTRAMUSCULAR | Status: AC
  Filled 2023-02-18: qty 1

## 2023-02-18 MED ORDER — MELOXICAM 15 MG PO TABS: 15.0000 mg | ORAL_TABLET | Freq: Every day | ORAL | 0 refills | Status: DC

## 2023-02-18 MED ORDER — DEXAMETHASONE SODIUM PHOSPHATE 10 MG/ML IJ SOLN
10.0000 mg | Freq: Once | INTRAMUSCULAR | Status: AC
  Administered 2023-02-18: 10 mg via INTRAMUSCULAR

## 2023-02-18 NOTE — ED Triage Notes (Signed)
Patient here today with c/o left leg pain that radiates through the entire leg X 3-4 weeks. Patient concerned with blood clot. Has h/o Lumbar bulging disc.

## 2023-02-18 NOTE — Discharge Instructions (Signed)
Meds ordered this encounter  Medications   dexamethasone (DECADRON) injection 10 mg   meloxicam (MOBIC) 15 MG tablet    Sig: Take 1 tablet (15 mg total) by mouth daily.    Dispense:  7 tablet    Refill:  0

## 2023-02-20 NOTE — ED Provider Notes (Signed)
[facility]   [id number] 02/18/23 Arrival Time: 1119  ASSESSMENT & PLAN:  1. Left leg pain    No concern for DVT. Suspect inflammatory vs sciatica-like pain.  Trial of: Discharge Medication List as of 02/18/2023  1:33 PM     START taking these medications   Details  meloxicam (MOBIC) 15 MG tablet Take 1 tablet (15 mg total) by mouth daily., Starting Mon 02/18/2023, Normal       Recommend:  Follow-up Information     Richardean Chimera, MD.   Specialty: Family Medicine Why: As needed. Contact information: [address] [place] [address] [phone]         [facility].   Why: If worsening or failing to improve as anticipated. Contact information: [address] [place] [address] [phone]               Reviewed expectations re: course of current medical issues. Questions answered. Outlined signs and symptoms indicating need for more acute intervention. Patient verbalized understanding. After Visit Summary given.  SUBJECTIVE: History from: patient. [name] is a 62 y.o. female who reports left leg pain that radiates through the entire leg X 3-4 weeks; but mostly of upper leg from buttock to around knee. H/O lumbar bulging disc. Denies extremity sensation changes or weakness. Normal ambulation. Normal bowel/bladder habit changes.   Past Surgical History:  Procedure Laterality Date   ABDOMINAL HYSTERECTOMY     ESOPHAGEAL DILATION  2006?   Dr.Hayes   MASS EXCISION  1995   thymus gland & LEFT VOCAL CORD   MASTECTOMY Left 2007   MASTECTOMY        OBJECTIVE:  Vitals:   02/18/23 1308  BP: 117/72  Pulse: 79  Resp: 16  Temp: 98.3 F (36.8 C)  TempSrc: Oral  SpO2: 98%  Weight: 93.4 kg  Height: 5' 4.75" (1.645 m)    General appearance: alert; no distress HEENT: [place]; AT Neck: supple with FROM Resp: unlabored respirations Extremities: LLE: warm with well perfused appearance;  without localized tenderness over left leg; without gross deformities; swelling: none; bruising: none; hip and knee ROM: normal CV: brisk extremity capillary refill of bilateral LE; 2+ DP pulse of bilateral LE. Skin: warm and dry; no visible rashes Neurologic: gait normal; normal sensation and strength of bilateral LE Psychological: alert and cooperative; normal mood and affect   Allergies  Allergen Reactions   Aspirin Swelling   Elemental Sulfur Nausea Only    Past Medical History:  Diagnosis Date   Acid reflux    Cancer (HCC) 1995   breast cancer   Essential hypertension 02/16/2015   Hypertension    Obesity 02/16/2015   Post-operative nausea and vomiting    Thyroid disease    Vertigo    Social History   Socioeconomic History   Marital status: Single    Spouse name: Not on file   Number of children: Not on file   Years of education: Not on file   Highest education level: Not on file  Occupational History   Not on file  Tobacco Use   Smoking status: Never   Smokeless tobacco: Never   Tobacco comments:    never used tobacco  Vaping Use   Vaping status: Never Used  Substance and Sexual Activity   Alcohol use: No   Drug use: No   Sexual activity: Yes    Birth control/protection: Surgical    Comment: hysterectomy  Other  Topics Concern   Not on file  Social History Narrative   Not on file   Social Determinants of Health   Financial Resource Strain: Low Risk  (02/27/2021)   Overall Financial Resource Strain (CARDIA)    Difficulty of Paying Living Expenses: Not hard at all  Food Insecurity: No Food Insecurity (02/27/2021)   Hunger Vital Sign    Worried About Running Out of Food in the Last Year: Never true    Ran Out of Food in the Last Year: Never true  Transportation Needs: No Transportation Needs (02/27/2021)   PRAPARE - Administrator, Civil Service (Medical): No    Lack of Transportation (Non-Medical): No  Physical Activity: Insufficiently Active  (02/27/2021)   Exercise Vital Sign    Days of Exercise per Week: 3 days    Minutes of Exercise per Session: 30 min  Stress: No Stress Concern Present (02/27/2021)   [employer] of Occupational Health - Occupational Stress Questionnaire    Feeling of Stress : Not at all  Social Connections: Moderately Isolated (02/27/2021)   Social Connection and Isolation Panel [NHANES]    Frequency of Communication with Friends and Family: More than three times a week    Frequency of Social Gatherings with Friends and Family: More than three times a week    Attends Religious Services: More than 4 times per year    Active Member of [employer] or Organizations: No    Attends Engineer, structural: Never    Marital Status: Never married   Family History  Problem Relation Age of Onset   Diabetes Father    Asthma Mother    Thyroid disease Sister    Breast cancer Paternal Aunt    Stomach cancer Paternal Aunt    Colon cancer Neg Hx    Colon polyps Neg Hx    Esophageal cancer Neg Hx    Rectal cancer Neg Hx    Past Surgical History:  Procedure Laterality Date   ABDOMINAL HYSTERECTOMY     ESOPHAGEAL DILATION  2006?   Dr.Hayes   MASS EXCISION  1995   thymus gland & LEFT VOCAL CORD   MASTECTOMY Left 2007   Bennie Dallas, MD 02/20/23 1459

## 2023-06-14 ENCOUNTER — Encounter: Payer: Self-pay | Admitting: Internal Medicine

## 2023-06-14 ENCOUNTER — Ambulatory Visit (INDEPENDENT_AMBULATORY_CARE_PROVIDER_SITE_OTHER): Payer: Medicaid Other | Admitting: Internal Medicine

## 2023-06-14 VITALS — BP 120/60 | HR 78 | Ht 64.75 in | Wt 202.8 lb

## 2023-06-14 DIAGNOSIS — E041 Nontoxic single thyroid nodule: Secondary | ICD-10-CM | POA: Diagnosis not present

## 2023-06-14 DIAGNOSIS — E890 Postprocedural hypothyroidism: Secondary | ICD-10-CM | POA: Diagnosis not present

## 2023-06-14 DIAGNOSIS — E559 Vitamin D deficiency, unspecified: Secondary | ICD-10-CM | POA: Diagnosis not present

## 2023-06-14 NOTE — Progress Notes (Addendum)
 Patient ID: [name], female   DOB: 1960-12-22, 63 y.o.   MRN: [id number]   HPI  [name] is a 63 y.o.-year-old female, initially referred by her PCP, Dr. Toribio, returning for follow-up for thyroid  nodule, post ablative hypothyroidism, vitamin D  deficiency.  Last visit 1 year ago.  Interim history: She continues to have hoarseness, which is chronic for her.   She has dry throat. She has acid reflux. Hot flushes resolved since last visit. She describes tightness on the right side of her neck.  Left thyroid  nodule -Diagnosed in 2015  Reviewed prev. Investigations: She sees ENT (Dr. Carlie) after her neck Sx in 1997. 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 FNA (09/15/2013): AUS FNA (01/14/2014): FLUS Thyroid  U/S (10/04/2017): Complex left lobe nodule 1 measures 4.9 x 3.9 x 3.9 cm and previously measured 3.6 x 2.9 x 3.0 cm. Biopsy has been performed previously. I referred her for alcohol  instillation in the cyst (Dr. Luverne): FNA + EtOH instillation (03/31/2018):  Scant ep. cells  Thyroid  U/S (02/04/2021): Parenchymal Echotexture: Mildly heterogenous Isthmus: 0.2 cm Right lobe: 2.0 x 0.8 x 0.8 cm Left lobe: 4.9 x 3.4 x 3.4 cm _________________________________________________________   Again seen is a large solid and cystic nodule involving almost the entirety of the left thyroid  lobe measuring 4.9 x 3.4 x 3.4 cm, previously 4.9 x 4.0 x 3.9 cm. This nodule was previously biopsied in 2015.   IMPRESSION: Stable appearance of a large left thyroid  nodule measuring up to 4.9 cm, essentially unchanged since 2019. This nodule was previously biopsied in 2015, correlate with biopsy results. Thyroid  U/S (06/29/2022): Parenchymal Echotexture: Markedly heterogenous  Isthmus: 0.2 cm  Right lobe: 2.7 x 0.5 x 0.7 cm Left lobe: 4.6 x 3.2 x 3.4 cm  _________________________________________________________   Estimated total  number of nodules >/= 1 cm: 1 _________________________________________________________   Nodule # 1: Solitary nodule occupying the majority of the left mid gland remains mixed cystic and solid. Today, the nodule measures 4.2 x 3.6 x 3.1 cm slightly smaller than 4.9 x 3.4 x 3.4 cm previously. No new nodules or suspicious features are identified.   IMPRESSION: Continued stability of previously biopsied nodule in the left mid gland. Five year stability is consistent with benignity. No further imaging follow-up is recommended.   No new nodules or suspicious features. Pt denies: - feeling nodules in neck - dysphagia - choking - SOB with lying down She has chronic hoarseness.  Hypothyroidism: -postablation -not well controlled.  In the last 1.5 years, we have been decreasing the dose of levothyroxine  from 175 to 112 mcg daily. However, at last visit, she was given 125 mcg from the pharmacy. We then decreased back to 112 mcg daily, then to 100 mcg daily.  She takes Synthroid  DAW -as she had choking with the generic levothyroxine  in the past (although, upon questioning, she is not sure whether she is given the brand name from the pharmacy) -choking improved. - in am - fasting - at least 30 min from b'fast - no calcium - no iron - no multivitamins - + PPIs >4h after LT4 - not on Biotin  Reviewed her TFTs: Lab Results  Component Value Date   TSH 0.487 01/23/2023   TSH 1.409 07/06/2022   TSH 1.34 06/12/2022   TSH 3.07 04/12/2021   TSH 3.09 07/07/2020   TSH 3.39 03/22/2020   TSH 0.11 (L) 03/20/2019   TSH 0.112 (L) 09/06/2018  TSH 0.13 (L) 04/28/2018   TSH <0.01 Repeated and verified X2. (L) 03/10/2018   FREET4 1.34 06/12/2022   FREET4 1.03 04/12/2021   FREET4 1.07 07/07/2020   FREET4 0.95 03/22/2020   FREET4 1.45 03/20/2019   FREET4 1.21 04/28/2018   FREET4 1.58 03/10/2018    She has a family history of thyroid  disease in her sister. No FH of thyroid  cancer. No h/o  radiation tx to head or neck. No herbal supplements. No Biotin use. No recent steroids use.   She has a history of breast cancer diagnosed in 63- sees Dr. Timmy. She also has a history of HTN.   She also has vit D def. >> previously on 1000 units vitamin D  daily >> increased to 4000 units vitamin D  daily >> on 5000 units daily. Now 2000 units daily she believes.  Reviewed vitamin levels: Lab Results  Component Value Date   VD25OH 33.10 06/12/2022   VD25OH 24.06 (L) 04/12/2021   VD25OH 14.9 (L) 07/07/2020   VD25OH 17.2 (L) 01/01/2018   Lab Results  Component Value Date   VITAMINB12 511 07/07/2020     ROS: + See HPI Neurological: no tremors/no numbness/+ tingling and burning/no dizziness/+ paresthesias - was on Gabapentin before  I reviewed pt's medications, allergies, PMH, social hx, family hx, and changes were documented in the history of present illness. Otherwise, unchanged from my initial visit note.  Past Medical History:  Diagnosis Date   Acid reflux    Cancer (HCC) 1995   breast cancer   Essential hypertension 02/16/2015   Hypertension    Obesity 02/16/2015   Post-operative nausea and vomiting    Thyroid  disease    Vertigo    Past Surgical History:  Procedure Laterality Date   ABDOMINAL HYSTERECTOMY     ESOPHAGEAL DILATION  2006?   Dr.Hayes   MASS EXCISION  1995   thymus gland & LEFT VOCAL CORD   MASTECTOMY Left 2007   MASTECTOMY     Social History   Socioeconomic History   Marital status: Single    Spouse name: Not on file   Number of children: Not on file   Years of education: Not on file   Highest education level: Not on file  Occupational History   Not on file  Tobacco Use   Smoking status: Never   Smokeless tobacco: Never   Tobacco comments:    never used tobacco  Vaping Use   Vaping status: Never Used  Substance and Sexual Activity   Alcohol  use: No   Drug use: No   Sexual activity: Yes    Birth control/protection: Surgical     Comment: hysterectomy  Other Topics Concern   Not on file  Social History Narrative   Not on file   Social Drivers of Health   Financial Resource Strain: Low Risk  (02/27/2021)   Overall Financial Resource Strain (CARDIA)    Difficulty of Paying Living Expenses: Not hard at all  Food Insecurity: No Food Insecurity (02/27/2021)   Hunger Vital Sign    Worried About Running Out of Food in the Last Year: Never true    Ran Out of Food in the Last Year: Never true  Transportation Needs: No Transportation Needs (02/27/2021)   PRAPARE - Administrator, Civil Service (Medical): No    Lack of Transportation (Non-Medical): No  Physical Activity: Insufficiently Active (02/27/2021)   Exercise Vital Sign    Days of Exercise per Week: 3 days    Minutes  of Exercise per Session: 30 min  Stress: No Stress Concern Present (02/27/2021)   [employer] of Occupational Health - Occupational Stress Questionnaire    Feeling of Stress : Not at all  Social Connections: Moderately Isolated (02/27/2021)   Social Connection and Isolation Panel [NHANES]    Frequency of Communication with Friends and Family: More than three times a week    Frequency of Social Gatherings with Friends and Family: More than three times a week    Attends Religious Services: More than 4 times per year    Active Member of [employer] or Organizations: No    Attends Banker Meetings: Never    Marital Status: Never married  Intimate Partner Violence: Not At Risk (02/27/2021)   Humiliation, Afraid, Rape, and Kick questionnaire    Fear of Current or Ex-Partner: No    Emotionally Abused: No    Physically Abused: No    Sexually Abused: No   Current Outpatient Medications on File Prior to Visit  Medication Sig Dispense Refill   acetaminophen  (TYLENOL ) 500 MG tablet Take 1 tablet (500 mg total) by mouth every 6 (six) hours as needed. 30 tablet 0   alum & mag hydroxide-simeth (MAALOX MAX) 400-400-40 MG/5ML suspension  Take 5 mLs by mouth every 6 (six) hours as needed for indigestion. 355 mL 0   cetirizine  (ZYRTEC ) 10 MG tablet Take 10 mg by mouth daily as needed.     cholecalciferol (VITAMIN D ) 1000 units tablet Take 1,000 Units by mouth daily.     famotidine  (PEPCID ) 20 MG tablet Take 1 tablet (20 mg total) by mouth 2 (two) times daily. 30 tablet 0   lisinopril -hydrochlorothiazide  (PRINZIDE ,ZESTORETIC ) 10-12.5 MG per tablet Take 1 tablet by mouth daily. 30 tablet 2   meloxicam  (MOBIC ) 15 MG tablet Take 1 tablet (15 mg total) by mouth daily. 7 tablet 0   omeprazole  (PRILOSEC) 20 MG capsule Take 20 mg by mouth 2 (two) times daily.     ondansetron  (ZOFRAN ) 4 MG tablet Take 1 tablet (4 mg total) by mouth every 8 (eight) hours as needed for nausea or vomiting. 12 tablet 0   SYNTHROID  100 MCG tablet TAKE 1 TABLET BY MOUTH ONCE DAILY BEFORE BREAKFAST 30 tablet 0   Current Facility-Administered Medications on File Prior to Visit  Medication Dose Route Frequency Provider Last Rate Last Admin   Alcohol  (DEHYDRATED ALCOHOL  98%) 98 % injection SOLN 5 mL  5 mL Intravenous Once Matthews, Kacie Sue-Ellen, PA       Allergies  Allergen Reactions   Aspirin Swelling   Elemental Sulfur Nausea Only   Family History  Problem Relation Age of Onset   Diabetes Father    Asthma Mother    Thyroid  disease Sister    Breast cancer Paternal Aunt    Stomach cancer Paternal Aunt    Colon cancer Neg Hx    Colon polyps Neg Hx    Esophageal cancer Neg Hx    Rectal cancer Neg Hx     PE: BP 120/60   Pulse 78   Ht 5' 4.75 (1.645 m)   Wt 202 lb 12.8 oz (92 kg)   SpO2 98%   BMI 34.01 kg/m  Wt Readings from Last 3 Encounters:  06/14/23 202 lb 12.8 oz (92 kg)  02/18/23 206 lb (93.4 kg)  01/23/23 206 lb (93.4 kg)   Constitutional: overweight, in NAD Eyes: EOMI, no exophthalmos ENT:  no thyromegaly, + Palpable mild thyromegaly, no cervical lymphadenopathy Cardiovascular: RRR, No MRG  Respiratory: CTA B Musculoskeletal: no  deformities Skin: no rashes Neurological: no tremor with outstretched hands  ASSESSMENT: 1. Thyroid  nodule  2.  Hypothyroidism  3.  Tingling in the extremities  PLAN: 1. Thyroid  nodule -Patient with history of a large left dominant nodule, almost entirely cystic, s/p drainage of the cyst and ethanol instillation in 03/2018 as the previous aspirations did not prevent cyst reforming.  Per review of the thyroid  ultrasound images, the nodule did not show any concerning features: No calcifications, internal blood flow, taller than wide distribution and also no irregular margins.  Also, does not have a thyroid  cancer family history or personal history of radiation therapy to head or neck.  The previous biopsies were inconclusive most likely due to the cystic nature of the nodule. -In the past, she had occasional choking but not recently - he has no neck compression symptoms now.  She has chronic hoarseness after her sinus surgery -After last visit we checked another thyroid  ultrasound and nodules were stable.  Imaging follow-up was not indicated anymore -At today's visit, she describes tightness on the right side of her neck.  This appears to correspond to her SCM.  We discussed that this muscle appears to be tight and I recommended to change the pillow first.  If the discomfort remains, she may need to see orthopedics and have x-rays of her cervical spine. -Will continue to follow her clinically -I will see her back in 1 year  2. Hypothyroidism - latest thyroid  labs reviewed with pt. >> normal: Lab Results  Component Value Date   TSH 0.487 01/23/2023  - she continues on Synthroid  d.a.w. 100 mcg daily (she needs to use name brand as she had choking on generic levothyroxine  previously) - pt feels good on this dose. - we discussed about taking the thyroid  hormone every day, with water , >30 minutes before breakfast, separated by >4 hours from acid reflux medications, calcium, iron, multivitamins.  Pt. is taking it correctly. - will check thyroid  tests today: TSH and fT4 - If labs are abnormal, she will need to return for repeat TFTs in 1.5 months  3.  Vitamin D  deficiency -Vitamin D  was very low previously, at 14.9 -We increased her supplement dose afterwards to 5000 units daily but she was missing doses. -At last visit she mentions that she was taking the vitamin D  consistently.  At that time, the level was normal, at 33.10 -However, at today's visit she tells me that approximately 2 weeks ago she changed to 2000 units vitamin D  daily as she could not find the 5000 unit dose. -Will recheck the level today and may need to increase the dose afterwards  Needs refills.  Orders Placed This Encounter  Procedures   TSH   T4, free   Vitamin D , 25-hydroxy   Office Visit on 06/14/2023  Component Date Value Ref Range Status   TSH 06/14/2023 0.51  0.40 - 4.50 mIU/L Final   Free T4 06/14/2023 1.4  0.8 - 1.8 ng/dL Final   Vit D, 74-Ybimnkb 06/14/2023 25 (L)  30 - 100 ng/mL Final   Comment: Vitamin D  Status         25-OH Vitamin D : . Deficiency:                    <20 ng/mL Insufficiency:             20 - 29 ng/mL Optimal:                 >  or = 30 ng/mL . For 25-OH Vitamin D  testing on patients on  D2-supplementation and patients for whom quantitation  of D2 and D3 fractions is required, the [facility](TM) 25-OH VIT D, (D2,D3), LC/MS/MS is recommended: order  code 07111 (patients >48yrs). . See Note 1 . Note 1 . For additional information, please refer to  http://education.[facility].com/faq/FAQ199  (This link is being provided for informational/ educational purposes only.)   Vitamin D  level is low, while thyroid  tests are normal.  We can continue Synthroid  at the same dose, but will need to increase the dose of the vitamin D  to 4000 units daily.  Lela Fendt, MD PhD [facility] Endocrinology

## 2023-06-14 NOTE — Patient Instructions (Addendum)
 Please stop at the lab.  Continue Synthroid  100 mcg daily.  Take the thyroid  hormone every day, with water , at least 30 minutes before breakfast, separated by at least 4 hours from: - acid reflux medications - calcium - iron - multivitamins  For now, continue vitamin D  2000 units daily.  Please come back for a follow-up appointment in 1 year.

## 2023-06-15 LAB — VITAMIN D 25 HYDROXY (VIT D DEFICIENCY, FRACTURES): Vit D, 25-Hydroxy: 25 ng/mL — ABNORMAL LOW (ref 30–100)

## 2023-06-15 LAB — TSH: TSH: 0.51 m[IU]/L (ref 0.40–4.50)

## 2023-06-15 LAB — T4, FREE: Free T4: 1.4 ng/dL (ref 0.8–1.8)

## 2023-06-17 MED ORDER — SYNTHROID 100 MCG PO TABS: 100.0000 ug | ORAL_TABLET | Freq: Every day | ORAL | 3 refills | Status: DC

## 2023-06-17 NOTE — Addendum Note (Signed)
 Addended by: Carlus Pavlov on: 06/17/2023 10:00 AM   Modules accepted: Orders

## 2023-06-18 ENCOUNTER — Encounter: Payer: Self-pay | Admitting: Internal Medicine

## 2023-07-08 ENCOUNTER — Encounter: Payer: Self-pay | Admitting: Medical Oncology

## 2023-07-08 ENCOUNTER — Inpatient Hospital Stay: Payer: Medicaid Other | Attending: Hematology & Oncology

## 2023-07-08 ENCOUNTER — Inpatient Hospital Stay (HOSPITAL_BASED_OUTPATIENT_CLINIC_OR_DEPARTMENT_OTHER): Payer: Medicaid Other | Admitting: Medical Oncology

## 2023-07-08 VITALS — BP 123/66 | HR 78 | Temp 98.8°F | Resp 18 | Ht 64.0 in | Wt 203.0 lb

## 2023-07-08 DIAGNOSIS — C50912 Malignant neoplasm of unspecified site of left female breast: Secondary | ICD-10-CM

## 2023-07-08 DIAGNOSIS — Z79899 Other long term (current) drug therapy: Secondary | ICD-10-CM | POA: Insufficient documentation

## 2023-07-08 DIAGNOSIS — Z923 Personal history of irradiation: Secondary | ICD-10-CM | POA: Insufficient documentation

## 2023-07-08 DIAGNOSIS — E041 Nontoxic single thyroid nodule: Secondary | ICD-10-CM | POA: Insufficient documentation

## 2023-07-08 DIAGNOSIS — Z853 Personal history of malignant neoplasm of breast: Secondary | ICD-10-CM | POA: Insufficient documentation

## 2023-07-08 DIAGNOSIS — Z1239 Encounter for other screening for malignant neoplasm of breast: Secondary | ICD-10-CM

## 2023-07-08 LAB — CMP (CANCER CENTER ONLY)
ALT: 14 U/L (ref 0–44)
AST: 15 U/L (ref 15–41)
Albumin: 4.5 g/dL (ref 3.5–5.0)
Alkaline Phosphatase: 70 U/L (ref 38–126)
Anion gap: 8 (ref 5–15)
BUN: 17 mg/dL (ref 8–23)
CO2: 28 mmol/L (ref 22–32)
Calcium: 9.5 mg/dL (ref 8.9–10.3)
Chloride: 105 mmol/L (ref 98–111)
Creatinine: 0.88 mg/dL (ref 0.44–1.00)
GFR, Estimated: >60 mL/min (ref >60)
Glucose, Bld: 143 mg/dL — ABNORMAL HIGH (ref 70–99)
Potassium: 4.6 mmol/L (ref 3.5–5.1)
Sodium: 141 mmol/L (ref 135–145)
Total Bilirubin: 0.3 mg/dL (ref 0.0–1.2)
Total Protein: 7.2 g/dL (ref 6.5–8.1)

## 2023-07-08 LAB — CBC WITH DIFFERENTIAL (CANCER CENTER ONLY)
Abs Immature Granulocytes: 0.02 10*3/uL (ref 0.00–0.07)
Basophils Absolute: 0 10*3/uL (ref 0.0–0.1)
Basophils Relative: 0 %
Eosinophils Absolute: 0.1 10*3/uL (ref 0.0–0.5)
Eosinophils Relative: 2 %
HCT: 37.9 % (ref 36.0–46.0)
Hemoglobin: 12.5 g/dL (ref 12.0–15.0)
Immature Granulocytes: 0 %
Lymphocytes Relative: 36 %
Lymphs Abs: 1.9 10*3/uL (ref 0.7–4.0)
MCH: 27.8 pg (ref 26.0–34.0)
MCHC: 33 g/dL (ref 30.0–36.0)
MCV: 84.4 fL (ref 80.0–100.0)
Monocytes Absolute: 0.3 10*3/uL (ref 0.1–1.0)
Monocytes Relative: 6 %
Neutro Abs: 3 10*3/uL (ref 1.7–7.7)
Neutrophils Relative %: 56 %
Platelet Count: 221 10*3/uL (ref 150–400)
RBC: 4.49 MIL/uL (ref 3.87–5.11)
RDW: 13.1 % (ref 11.5–15.5)
WBC Count: 5.4 10*3/uL (ref 4.0–10.5)
nRBC: 0 % (ref 0.0–0.2)

## 2023-07-08 LAB — LACTATE DEHYDROGENASE: LDH: 159 U/L (ref 98–192)

## 2023-07-08 NOTE — Progress Notes (Signed)
Hematology and Oncology Follow Up Visit  [name] [id number] Mar 02, 1961 63 y.o. 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  We see her yearly.    Today she reports that she has been well overall.   She has had no change in bowel or bladder habits.  She has had no cough or shortness of breath.  There has been no nausea or vomiting.  She has had no leg swelling.  She has had no bleeding.  She does have thyroid nodules which are suggested to be benign by radiology.   She is taking vitamin D 4,000 I.U once daily. This has improved her energy, aches and pains and tingling.   Her last mammogram was done on Nov 2023. She states that she is planning on getting caught up on this next week   Appetite ok. No night sweats, no unintentional weight loss, no lumps or masses.   Currently, I would say that her performance status is probably ECOG 1.       Wt Readings from Last 3 Encounters:  07/08/23 203 lb (92.1 kg)  06/14/23 202 lb 12.8 oz (92 kg)  02/18/23 206 lb (93.4 kg)    Medications:  Current Outpatient Medications:    acetaminophen (TYLENOL) 500 MG tablet, Take 1 tablet (500 mg total) by mouth every 6 (six) hours as needed., Disp: 30 tablet, Rfl: 0   alum & mag hydroxide-simeth (MAALOX MAX) 400-400-40 MG/5ML suspension, Take 5 mLs by mouth every 6 (six) hours as needed for indigestion., Disp: 355 mL, Rfl: 0   cetirizine (ZYRTEC) 10 MG tablet, Take 10 mg by mouth daily as needed., Disp: , Rfl:    cholecalciferol (VITAMIN D) 1000 units tablet, Take 1,000 Units by mouth daily., Disp: , Rfl:    famotidine (PEPCID) 20 MG tablet, Take 1 tablet (20 mg total) by mouth 2 (two) times daily., Disp: 30 tablet, Rfl: 0   lisinopril-hydrochlorothiazide (PRINZIDE,ZESTORETIC) 10-12.5 MG per tablet,  Take 1 tablet by mouth daily., Disp: 30 tablet, Rfl: 2   meloxicam (MOBIC) 15 MG tablet, Take 1 tablet (15 mg total) by mouth daily., Disp: 7 tablet, Rfl: 0   omeprazole (PRILOSEC) 20 MG capsule, Take 20 mg by mouth 2 (two) times daily., Disp: , Rfl:    ondansetron (ZOFRAN) 4 MG tablet, Take 1 tablet (4 mg total) by mouth every 8 (eight) hours as needed for nausea or vomiting., Disp: 12 tablet, Rfl: 0   SYNTHROID 100 MCG tablet, Take 1 tablet (100 mcg total) by mouth daily before breakfast., Disp: 90 tablet, Rfl: 3 No current facility-administered medications for this visit.  Facility-Administered Medications Ordered in Other Visits:    Alcohol (DEHYDRATED ALCOHOL 98%) 98 % injection SOLN 5 mL, 5 mL, Intravenous, Once, Ashley Royalty, Senaida Ores, PA  Allergies:  Allergies  Allergen Reactions   Aspirin Swelling    Patient doesn't remember what part of body gets swollen   Elemental Sulfur Nausea Only    Past Medical History, Surgical history, Social history, and Family History were reviewed and updated.  Review of Systems: Review of Systems  Constitutional: Negative.   HENT:  Negative.    Eyes: Negative.   Respiratory:  Negative for shortness of breath.   Cardiovascular: Negative.   Gastrointestinal: Negative.   Endocrine: Negative.   Genitourinary: Negative.  Musculoskeletal:  Positive for myalgias.  Skin: Negative.   Neurological: Negative.   Hematological: Negative.   Psychiatric/Behavioral: Negative.      Physical Exam:  height is 5\' 4"  (1.626 m) and weight is 203 lb (92.1 kg). Her oral temperature is 98.8 F (37.1 C). Her blood pressure is 123/66 and her pulse is 78. Her respiration is 18 and oxygen saturation is 100%.   Wt Readings from Last 3 Encounters:  07/08/23 203 lb (92.1 kg)  06/14/23 202 lb 12.8 oz (92 kg)  02/18/23 206 lb (93.4 kg)    Physical Exam Vitals reviewed.  HENT:     Head: Normocephalic and atraumatic.  Eyes:     Pupils: Pupils are equal,  round, and reactive to light.  Cardiovascular:     Rate and Rhythm: Normal rate and regular rhythm.     Heart sounds: Normal heart sounds.  Pulmonary:     Effort: Pulmonary effort is normal.     Breath sounds: Normal breath sounds.  Chest:     Chest wall: No mass, deformity, swelling, tenderness or edema.  Breasts:    Right: Normal. No swelling, bleeding, inverted nipple, mass, nipple discharge, skin change or tenderness.     Left: Absent. No swelling or mass.  Abdominal:     General: Bowel sounds are normal.     Palpations: Abdomen is soft.  Musculoskeletal:        General: No tenderness or deformity. Normal range of motion.     Cervical back: Normal range of motion.  Lymphadenopathy:     Cervical: No cervical adenopathy.     Upper Body:     Right upper body: No supraclavicular, axillary or pectoral adenopathy.     Left upper body: No supraclavicular, axillary or pectoral adenopathy.  Skin:    General: Skin is warm and dry.     Findings: No erythema or rash.  Neurological:     Mental Status: She is alert and oriented to person, place, and time.  Psychiatric:        Behavior: Behavior normal.        Thought Content: Thought content normal.        Judgment: Judgment normal.      Lab Results  Component Value Date   WBC 5.4 07/08/2023   HGB 12.5 07/08/2023   HCT 37.9 07/08/2023   MCV 84.4 07/08/2023   PLT 221 07/08/2023     Chemistry      Component Value Date/Time   NA 135 01/23/2023 0925   NA 139 12/15/2014 0834   K 3.7 01/23/2023 0925   K 4.1 12/15/2014 0834   CL 100 01/23/2023 0925   CL 101 10/28/2013 1150   CO2 22 01/23/2023 0925   CO2 22 12/15/2014 0834   BUN 13 01/23/2023 0925   BUN 13.0 12/15/2014 0834   CREATININE 0.94 01/23/2023 0925   CREATININE 0.87 07/06/2022 0927   CREATININE 0.8 12/15/2014 0834      Component Value Date/Time   CALCIUM 10.0 01/23/2023 0925   CALCIUM 9.0 12/15/2014 0834   ALKPHOS 76 01/23/2023 0925   ALKPHOS 81 12/15/2014 0834    AST 18 01/23/2023 0925   AST 14 (L) 07/06/2022 0927   AST 16 12/15/2014 0834   ALT 19 01/23/2023 0925   ALT 16 07/06/2022 0927   ALT 13 12/15/2014 0834   BILITOT 0.6 01/23/2023 0925   BILITOT 0.4 07/06/2022 0927   BILITOT 0.31 12/15/2014 0834     Encounter Diagnosis  Name  Primary?   Adenocarcinoma of left breast (HCC) Yes    Impression and Plan: Ms. Mchale is a 63 year old postmenopausal African-American female.  She has had a remote history of recurrent adenocarcinoma of the left breast.  She underwent radiation for this recurrence after surgery.  I am happy to hear that she plans on getting UTD with her mammogram.   CBC looks normal today.   RTC 1 year MD, labs (CBC w/, CMP)  Rushie Chestnut, PA-C 1/27/202510:12 AM

## 2023-08-13 ENCOUNTER — Ambulatory Visit
Admission: RE | Admit: 2023-08-13 | Discharge: 2023-08-13 | Disposition: A | Discharge status: Home / Self Care | Payer: Medicaid Other | Source: Ambulatory Visit | Attending: Medical Oncology | Admitting: Medical Oncology

## 2023-08-13 DIAGNOSIS — Z1239 Encounter for other screening for malignant neoplasm of breast: Secondary | ICD-10-CM

## 2023-08-13 DIAGNOSIS — C50912 Malignant neoplasm of unspecified site of left female breast: Secondary | ICD-10-CM

## 2023-08-26 ENCOUNTER — Encounter: Payer: Self-pay | Admitting: Medical Oncology

## 2023-08-31 ENCOUNTER — Encounter: Payer: Self-pay | Admitting: Internal Medicine

## 2023-09-02 ENCOUNTER — Encounter: Payer: Self-pay | Admitting: Internal Medicine

## 2023-09-02 ENCOUNTER — Telehealth: Payer: Self-pay

## 2023-09-02 ENCOUNTER — Other Ambulatory Visit (HOSPITAL_COMMUNITY): Payer: Self-pay

## 2023-09-02 NOTE — Telephone Encounter (Signed)
 We can send a PA.  Please see my note -she had choking with generic levothyroxine in the past. If this is not approved, we could switch to white generic levothyroxine tablets and she needs to take 2 of them a day to make up for the 100 mcg daily dose of Synthroid that she is taking now.

## 2023-09-02 NOTE — Telephone Encounter (Signed)
 Pt has been notified.While on the phone with the patient she states that she spoke with her insurance and they need to know why she needs to be on the brand and not generic? Its going to cost her $107 oop

## 2023-09-02 NOTE — Telephone Encounter (Signed)
Pt needs a P.A. for Synthroid.

## 2023-09-02 NOTE — Telephone Encounter (Signed)
 Pharmacy Patient Advocate Encounter   Received notification from Pt Calls Messages that prior authorization for Synthroid is required/requested.   Insurance verification completed.   The patient is insured through [facility] .   Per test claim: The current 90 day co-pay is, $4.  No PA needed at this time. This test claim was processed through [facility]- copay amounts may vary at other pharmacies due to pharmacy/plan contracts, or as the patient moves through the different stages of their insurance plan.

## 2023-09-03 ENCOUNTER — Other Ambulatory Visit (HOSPITAL_COMMUNITY): Payer: Self-pay

## 2023-09-03 ENCOUNTER — Encounter: Payer: Self-pay | Admitting: Internal Medicine

## 2023-09-03 NOTE — Telephone Encounter (Signed)
 PA submitted. Key: [id number]

## 2023-09-06 NOTE — Telephone Encounter (Signed)
 Pharmacy Patient Advocate Encounter  Received notification from [facility] that Prior Authorization for Synthroid has been APPROVED through 09/02/24   PA #/Case ID/Reference #: [id number]

## 2023-12-26 ENCOUNTER — Ambulatory Visit (HOSPITAL_BASED_OUTPATIENT_CLINIC_OR_DEPARTMENT_OTHER)
Admission: RE | Admit: 2023-12-26 | Discharge: 2023-12-26 | Disposition: A | Discharge status: Home / Self Care | Source: Ambulatory Visit | Attending: Physician Assistant | Admitting: Physician Assistant

## 2023-12-26 ENCOUNTER — Other Ambulatory Visit (HOSPITAL_BASED_OUTPATIENT_CLINIC_OR_DEPARTMENT_OTHER): Payer: Self-pay | Admitting: Physician Assistant

## 2023-12-26 DIAGNOSIS — M79662 Pain in left lower leg: Secondary | ICD-10-CM | POA: Diagnosis present

## 2024-06-15 ENCOUNTER — Other Ambulatory Visit

## 2024-06-15 ENCOUNTER — Encounter: Payer: Self-pay | Admitting: Internal Medicine

## 2024-06-15 ENCOUNTER — Ambulatory Visit (INDEPENDENT_AMBULATORY_CARE_PROVIDER_SITE_OTHER): Payer: Medicaid Other | Admitting: Internal Medicine

## 2024-06-15 DIAGNOSIS — E890 Postprocedural hypothyroidism: Secondary | ICD-10-CM

## 2024-06-15 DIAGNOSIS — E041 Nontoxic single thyroid nodule: Secondary | ICD-10-CM | POA: Diagnosis not present

## 2024-06-15 DIAGNOSIS — E559 Vitamin D deficiency, unspecified: Secondary | ICD-10-CM | POA: Diagnosis not present

## 2024-06-15 NOTE — Progress Notes (Addendum)
 Patient ID: [name], female   DOB: 04-30-1961, 64 y.o.   MRN: [id number]   HPI  [name] is a 64 y.o.-year-old female, initially referred by her PCP, Dr. Toribio, returning for follow-up for thyroid  nodule, post ablative hypothyroidism, vitamin D  deficiency.  Last visit 1 year ago.  Interim history: She continues to have hoarseness, which is chronic for her. She previously had hot flashes, which resolved before last visit.  Left thyroid  nodule -Diagnosed in 2015  Reviewed prev. Investigations: She sees ENT (Dr. Carlie) after her neck Sx in 1997. 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 FNA (09/15/2013): AUS FNA (01/14/2014): FLUS Thyroid  U/S (10/04/2017): Complex left lobe nodule 1 measures 4.9 x 3.9 x 3.9 cm and previously measured 3.6 x 2.9 x 3.0 cm. Biopsy has been performed previously. I referred her for alcohol  instillation in the cyst (Dr. Luverne): FNA + EtOH instillation (03/31/2018):  Scant ep. cells  Thyroid  U/S (02/04/2021): Parenchymal Echotexture: Mildly heterogenous Isthmus: 0.2 cm Right lobe: 2.0 x 0.8 x 0.8 cm Left lobe: 4.9 x 3.4 x 3.4 cm _________________________________________________________   Again seen is a large solid and cystic nodule involving almost the entirety of the left thyroid  lobe measuring 4.9 x 3.4 x 3.4 cm, previously 4.9 x 4.0 x 3.9 cm. This nodule was previously biopsied in 2015.   IMPRESSION: Stable appearance of a large left thyroid  nodule measuring up to 4.9 cm, essentially unchanged since 2019. This nodule was previously biopsied in 2015, correlate with biopsy results. Thyroid  U/S (06/29/2022): Parenchymal Echotexture: Markedly heterogenous  Isthmus: 0.2 cm  Right lobe: 2.7 x 0.5 x 0.7 cm Left lobe: 4.6 x 3.2 x 3.4 cm  _________________________________________________________   Estimated total number of nodules >/= 1 cm:  1 _________________________________________________________   Nodule # 1: Solitary nodule occupying the majority of the left mid gland remains mixed cystic and solid. Today, the nodule measures 4.2 x 3.6 x 3.1 cm slightly smaller than 4.9 x 3.4 x 3.4 cm previously. No new nodules or suspicious features are identified.   IMPRESSION: Continued stability of previously biopsied nodule in the left mid gland. Five year stability is consistent with benignity. No further imaging follow-up is recommended.   No new nodules or suspicious features. 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  Hypothyroidism: -postablation -not well controlled.  In the last 1.5 years, we have been decreasing the dose of levothyroxine  from 175 to 112 mcg daily. However, at last visit, she was given 125 mcg from the pharmacy. We then decreased back to 112 mcg daily, then to 100 mcg daily.  She takes Synthroid  DAW 100 mcg - as she had choking with the generic levothyroxine  in the past: - in am - fasting - at least 30 min from b'fast - no calcium - no iron - no multivitamins - + PPIs >4h after LT4 - not on Biotin  Reviewed her TFTs: Lab Results  Component Value Date   TSH 0.51 06/14/2023   TSH 0.487 01/23/2023   TSH 1.409 07/06/2022   TSH 1.34 06/12/2022   TSH 3.07 04/12/2021   TSH 3.09 07/07/2020   TSH 3.39 03/22/2020   TSH 0.11 (L) 03/20/2019   TSH 0.112 (L) 09/06/2018   TSH 0.13 (L) 04/28/2018   FREET4 1.4 06/14/2023   FREET4 1.34 06/12/2022   FREET4 1.03 04/12/2021   FREET4  1.07 07/07/2020   FREET4 0.95 03/22/2020   FREET4 1.45 03/20/2019   FREET4 1.21 04/28/2018   FREET4 1.58 03/10/2018    She has a family history of thyroid  disease in her sister. No FH of thyroid  cancer. No h/o radiation tx to head or neck. No herbal supplements. No Biotin use. No recent steroids use.   She has a history of breast  cancer diagnosed in 64- sees Dr. Timmy. She also has a history of HTN.   She also has vit D def. >> recommended a vitamin D  supplement 5000 units daily.  At last visit, for the 2 weeks prior to the appointment, she was taking 2000 units daily >> I advised her to increase the dose to 4000 units daily >> still taking 2000 units daily, though...  Reviewed vitamin levels: Lab Results  Component Value Date   VD25OH 25 (L) 06/14/2023   VD25OH 33.10 06/12/2022   VD25OH 24.06 (L) 04/12/2021   VD25OH 14.9 (L) 07/07/2020   VD25OH 17.2 (L) 01/01/2018   ROS: + See HPI  I reviewed pt's medications, allergies, PMH, social hx, family hx, and changes were documented in the history of present illness. Otherwise, unchanged from my initial visit note.  Past Medical History:  Diagnosis Date   Acid reflux    Cancer (HCC) 1995   breast cancer   Essential hypertension 02/16/2015   Hypertension    Obesity 02/16/2015   Post-operative nausea and vomiting    Thyroid  disease    Vertigo    Past Surgical History:  Procedure Laterality Date   ABDOMINAL HYSTERECTOMY     ESOPHAGEAL DILATION  2006?   Dr.Hayes   MASS EXCISION  1995   thymus gland & LEFT VOCAL CORD   MASTECTOMY Left 2007   MASTECTOMY     Social History   Socioeconomic History   Marital status: Single    Spouse name: Not on file   Number of children: Not on file   Years of education: Not on file   Highest education level: Not on file  Occupational History   Not on file  Tobacco Use   Smoking status: Never   Smokeless tobacco: Never   Tobacco comments:    never used tobacco  Vaping Use   Vaping status: Never Used  Substance and Sexual Activity   Alcohol  use: No   Drug use: No   Sexual activity: Yes    Birth control/protection: Surgical    Comment: hysterectomy  Other Topics Concern   Not on file  Social History Narrative   Not on file   Social Drivers of Health   Tobacco Use: Low Risk (07/08/2023)   Patient History     Smoking Tobacco Use: Never    Smokeless Tobacco Use: Never    Passive Exposure: Not on file  Financial Resource Strain: Not on file  Food Insecurity: Not on file  Transportation Needs: Not on file  Physical Activity: Not on file  Stress: Not on file  Social Connections: Not on file  Intimate Partner Violence: Not on file  Depression (EYV7-0): Not on file  Alcohol  Screen: Not on file  Housing: Not on file  Utilities: Not on file  Health Literacy: Not on file   Current Outpatient Medications on File Prior to Visit  Medication Sig Dispense Refill   acetaminophen  (TYLENOL ) 500 MG tablet Take 1 tablet (500 mg total) by mouth every 6 (six) hours as needed. 30 tablet 0   alum & mag hydroxide-simeth (MAALOX MAX) 400-400-40  MG/5ML suspension Take 5 mLs by mouth every 6 (six) hours as needed for indigestion. 355 mL 0   cetirizine  (ZYRTEC ) 10 MG tablet Take 10 mg by mouth daily as needed.     cholecalciferol (VITAMIN D ) 1000 units tablet Take 1,000 Units by mouth daily.     famotidine  (PEPCID ) 20 MG tablet Take 1 tablet (20 mg total) by mouth 2 (two) times daily. 30 tablet 0   lisinopril -hydrochlorothiazide  (PRINZIDE ,ZESTORETIC ) 10-12.5 MG per tablet Take 1 tablet by mouth daily. 30 tablet 2   meloxicam  (MOBIC ) 15 MG tablet Take 1 tablet (15 mg total) by mouth daily. 7 tablet 0   omeprazole  (PRILOSEC) 20 MG capsule Take 20 mg by mouth 2 (two) times daily.     ondansetron  (ZOFRAN ) 4 MG tablet Take 1 tablet (4 mg total) by mouth every 8 (eight) hours as needed for nausea or vomiting. 12 tablet 0   SYNTHROID  100 MCG tablet Take 1 tablet (100 mcg total) by mouth daily before breakfast. 90 tablet 3   Current Facility-Administered Medications on File Prior to Visit  Medication Dose Route Frequency Provider Last Rate Last Admin   Alcohol  (DEHYDRATED ALCOHOL  98%) 98 % injection SOLN 5 mL  5 mL Intravenous Once Matthews, Kacie Sue-Ellen, PA       Allergies  Allergen Reactions   Aspirin Swelling     Patient doesn't remember what part of body gets swollen   Elemental Sulfur Nausea Only   Family History  Problem Relation Age of Onset   Diabetes Father    Asthma Mother    Thyroid  disease Sister    Breast cancer Paternal Aunt    Stomach cancer Paternal Aunt    Colon cancer Neg Hx    Colon polyps Neg Hx    Esophageal cancer Neg Hx    Rectal cancer Neg Hx    PE: There were no vitals taken for this visit. Wt Readings from Last 3 Encounters:  07/08/23 203 lb (92.1 kg)  06/14/23 202 lb 12.8 oz (92 kg)  02/18/23 206 lb (93.4 kg)   Constitutional: overweight, in NAD Eyes: EOMI, no exophthalmos ENT:  no thyromegaly, + Palpable mild thyromegaly, no cervical lymphadenopathy Cardiovascular: RRR, No MRG Respiratory: CTA B Musculoskeletal: no deformities Skin: no rashes Neurological: no tremor with outstretched hands  ASSESSMENT: 1. Thyroid  nodule  2.  Hypothyroidism  3.  Tingling in the extremities  PLAN: 1. Thyroid  nodule - Patient with a history of a large left nodule, almost entirely cystic, s/p drainage and ethanol instillation in 03/2018, as the previous aspirations did not prevent cyst reforming.  Per review of the thyroid  ultrasound images, the nodule did not show any concerning features: No calcifications, internal blood flow, taller than wide distribution, and also no irregular margins.  She does not have a thyroid  cancer or family history or personal history of radiation therapy to head or neck.  The previous biopsies were inconclusive most likely due to the cystic nature of the nodule. - She has no neck compression symptoms now.  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  She had occasional choking in the past but not recently.  At last visit, she had tightness on the right side of her neck but this appeared to correspond to increased dose of her SCM -we discussed that her muscle appears to be tight and I recommended to change her pillow first.  We discussed that  if the discomfort remains, she may need to see orthopedics and have x-rays of  her cervical spine. - The latest neck ultrasound from 2024 showed a stable nodule.  Imaging follow-up is not indicated anymore. - Will continue to follow her clinically for this - I will see her back in 1 year  2. Hypothyroidism - latest thyroid  labs reviewed with pt. >> normal: Lab Results  Component Value Date   TSH 0.51 06/14/2023  - she continues on Synthroid  d.a.w. 100 mcg daily.  She needs to use the namebrand as she had choking on generic levothyroxine  in the past - pt feels good on this dose. - we discussed about taking the thyroid  hormone every day, with water , >30 minutes before breakfast, separated by >4 hours from acid reflux medications, calcium, iron, multivitamins. Pt. is taking it correctly. - will check thyroid  tests today: TSH and fT4 - If labs are abnormal, she will need to return for repeat TFTs in 1.5 months   3.  Vitamin D  deficiency - Vitamin D  level was low previously, at 14.9 -We increased her supplement dose afterwards to 5000 units daily but she was missing doses. -At last visit, she mentioned that she was taking 2000 units daily in the 2 weeks prior to the appointment - At that time, vitamin D  was low, at 25, and I advised her to increase the dose of her supplement to 4000 units daily.  However, she forgot if she is still taking the 2000 units daily. - Will recheck the level today  Needs refills DAW Synthroid .  Component     Latest Ref Rng 06/15/2024  TSH     0.40 - 4.50 mIU/L 1.26   T4,Free(Direct)     0.8 - 1.8 ng/dL 1.5   Vitamin D , 25-Hydroxy     30 - 100 ng/mL 33   TFTs and vitamin D  level are normal.  We can continue the same dose of vitamin D  supplement for now.  Lela Fendt, MD PhD [facility]

## 2024-06-15 NOTE — Patient Instructions (Addendum)
 Please stop at the lab.  Continue Synthroid  100 mcg daily.  Take the thyroid  hormone every day, with water , at least 30 minutes before breakfast, separated by at least 4 hours from: - acid reflux medications - calcium - iron - multivitamins  Continue vitamin D  2000 units daily.  Please come back for a follow-up appointment in 1 year.

## 2024-06-16 ENCOUNTER — Ambulatory Visit: Payer: Self-pay | Admitting: Internal Medicine

## 2024-06-16 LAB — T4, FREE: Free T4: 1.5 ng/dL (ref 0.8–1.8)

## 2024-06-16 LAB — VITAMIN D 25 HYDROXY (VIT D DEFICIENCY, FRACTURES): Vit D, 25-Hydroxy: 33 ng/mL (ref 30–100)

## 2024-06-16 LAB — TSH: TSH: 1.26 m[IU]/L (ref 0.40–4.50)

## 2024-06-16 MED ORDER — SYNTHROID 100 MCG PO TABS: 100.0000 ug | ORAL_TABLET | Freq: Every day | ORAL | 3 refills | Status: AC

## 2024-06-16 NOTE — Addendum Note (Signed)
 Addended by: TRIXIE FILE on: 06/16/2024 09:00 AM   Modules accepted: Orders

## 2024-07-08 ENCOUNTER — Other Ambulatory Visit: Payer: Self-pay | Admitting: *Deleted

## 2024-07-08 ENCOUNTER — Encounter: Payer: Self-pay | Admitting: Hematology & Oncology

## 2024-07-08 ENCOUNTER — Inpatient Hospital Stay: Payer: Medicaid Other

## 2024-07-08 ENCOUNTER — Inpatient Hospital Stay: Payer: Medicaid Other | Attending: Hematology & Oncology | Admitting: Hematology & Oncology

## 2024-07-08 VITALS — BP 135/74 | HR 75 | Temp 98.4°F | Resp 20 | Ht 64.0 in | Wt 205.8 lb

## 2024-07-08 DIAGNOSIS — C50912 Malignant neoplasm of unspecified site of left female breast: Secondary | ICD-10-CM

## 2024-07-08 DIAGNOSIS — Z79899 Other long term (current) drug therapy: Secondary | ICD-10-CM | POA: Diagnosis not present

## 2024-07-08 DIAGNOSIS — R490 Dysphonia: Secondary | ICD-10-CM | POA: Insufficient documentation

## 2024-07-08 DIAGNOSIS — E041 Nontoxic single thyroid nodule: Secondary | ICD-10-CM | POA: Diagnosis not present

## 2024-07-08 DIAGNOSIS — Z9012 Acquired absence of left breast and nipple: Secondary | ICD-10-CM | POA: Insufficient documentation

## 2024-07-08 DIAGNOSIS — Z853 Personal history of malignant neoplasm of breast: Secondary | ICD-10-CM | POA: Insufficient documentation

## 2024-07-08 DIAGNOSIS — Z923 Personal history of irradiation: Secondary | ICD-10-CM | POA: Diagnosis not present

## 2024-07-08 LAB — CBC WITH DIFFERENTIAL (CANCER CENTER ONLY)
Abs Immature Granulocytes: 0.01 10*3/uL (ref 0.00–0.07)
Basophils Absolute: 0 10*3/uL (ref 0.0–0.1)
Basophils Relative: 0 %
Eosinophils Absolute: 0.2 10*3/uL (ref 0.0–0.5)
Eosinophils Relative: 3 %
HCT: 38.3 % (ref 36.0–46.0)
Hemoglobin: 12.9 g/dL (ref 12.0–15.0)
Immature Granulocytes: 0 %
Lymphocytes Relative: 39 %
Lymphs Abs: 2 10*3/uL (ref 0.7–4.0)
MCH: 27.8 pg (ref 26.0–34.0)
MCHC: 33.7 g/dL (ref 30.0–36.0)
MCV: 82.5 fL (ref 80.0–100.0)
Monocytes Absolute: 0.4 10*3/uL (ref 0.1–1.0)
Monocytes Relative: 7 %
Neutro Abs: 2.5 10*3/uL (ref 1.7–7.7)
Neutrophils Relative %: 51 %
Platelet Count: 234 10*3/uL (ref 150–400)
RBC: 4.64 MIL/uL (ref 3.87–5.11)
RDW: 13.2 % (ref 11.5–15.5)
WBC Count: 5.1 10*3/uL (ref 4.0–10.5)
nRBC: 0 % (ref 0.0–0.2)

## 2024-07-08 LAB — COMPREHENSIVE METABOLIC PANEL WITH GFR
ALT: 20 U/L (ref 0–44)
AST: 21 U/L (ref 15–41)
Albumin: 4.4 g/dL (ref 3.5–5.0)
Alkaline Phosphatase: 88 U/L (ref 38–126)
Anion gap: 10 (ref 5–15)
BUN: 10 mg/dL (ref 8–23)
CO2: 25 mmol/L (ref 22–32)
Calcium: 9.4 mg/dL (ref 8.9–10.3)
Chloride: 104 mmol/L (ref 98–111)
Creatinine, Ser: 0.76 mg/dL (ref 0.44–1.00)
GFR, Estimated: >60 mL/min
Glucose, Bld: 97 mg/dL (ref 70–99)
Potassium: 4.5 mmol/L (ref 3.5–5.1)
Sodium: 139 mmol/L (ref 135–145)
Total Bilirubin: 0.3 mg/dL (ref 0.0–1.2)
Total Protein: 7.4 g/dL (ref 6.5–8.1)

## 2024-07-08 NOTE — Progress Notes (Signed)
 " Hematology and Oncology Follow Up Visit  [name] [id number] Aug 02, 1960 64 y.o. 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 Klutts is back for a follow-up.  We see her yearly.    The main problem that she is having is more back pain.  This clearly is from the asymmetry of the chest wall.  She has a left mastectomy.  She has a large right breast.  I think this is causing a lot of back issues with back strain.  I think we probably need to think about a breast reduction for her just to help with her overall health.  We will see about making a referral.  Dr. Lowery is very good with respect to help in our patients..  Otherwise, she is doing okay.  She has not gone anywhere the past year.  She had a mammogram back in March.  She has had no nausea or vomiting.  She is trying to lose weight.  I know it is tough during the winter since she really cannot walk.  She has had no problems with cough.  She does have some hoarseness.  Again this is chronic.  She has had no change in bowel or bladder habits.  There has been no leg swelling.  She has had no rashes.  Overall, I would say that her performance status is probably ECOG 1.       Wt Readings from Last 3 Encounters:  07/08/24 205 lb 12.8 oz (93.4 kg)  07/08/23 203 lb (92.1 kg)  06/14/23 202 lb 12.8 oz (92 kg)    Medications:  Current Outpatient Medications:    acetaminophen  (TYLENOL ) 500 MG tablet, Take 1 tablet (500 mg total) by mouth every 6 (six) hours as needed., Disp: 30 tablet, Rfl: 0   alum & mag hydroxide-simeth (MAALOX MAX) 400-400-40 MG/5ML suspension, Take 5 mLs by mouth every 6 (six) hours as needed for indigestion., Disp: 355 mL, Rfl: 0   cetirizine  (ZYRTEC ) 10 MG tablet, Take 10 mg by mouth daily as needed., Disp: , Rfl:    cholecalciferol (VITAMIN  D) 1000 units tablet, Take 1,000 Units by mouth daily., Disp: , Rfl:    famotidine  (PEPCID ) 20 MG tablet, Take 1 tablet (20 mg total) by mouth 2 (two) times daily., Disp: 30 tablet, Rfl: 0   fluticasone  (FLONASE ) 50 MCG/ACT nasal spray, Place 2 sprays into both nostrils daily., Disp: , Rfl:    lisinopril -hydrochlorothiazide  (PRINZIDE ,ZESTORETIC ) 10-12.5 MG per tablet, Take 1 tablet by mouth daily., Disp: 30 tablet, Rfl: 2   ondansetron  (ZOFRAN ) 4 MG tablet, Take 1 tablet (4 mg total) by mouth every 8 (eight) hours as needed for nausea or vomiting., Disp: 12 tablet, Rfl: 0   pantoprazole (PROTONIX) 40 MG tablet, Take 40 mg by mouth daily., Disp: , Rfl:    SYNTHROID  100 MCG tablet, Take 1 tablet (100 mcg total) by mouth daily before breakfast., Disp: 90 tablet, Rfl: 3   VENTOLIN  HFA 108 (90 Base) MCG/ACT inhaler, 2 puffs every 4 (four) hours as needed., Disp: , Rfl:  No current facility-administered medications for this visit.  Facility-Administered Medications Ordered in Other Visits:    Alcohol  (DEHYDRATED ALCOHOL  98%) 98 % injection SOLN 5 mL, 5 mL, Intravenous, Once, Alvia, Kacie Sue-Ellen, PA  Allergies:  Allergies  Allergen Reactions   Aspirin Swelling    Patient doesn't  remember what part of body gets swollen   Elemental Sulfur Nausea Only    Past Medical History, Surgical history, Social history, and Family History were reviewed and updated.  Review of Systems: Review of Systems  Constitutional: Negative.   HENT:  Negative.    Eyes: Negative.   Respiratory:  Negative for shortness of breath.   Cardiovascular: Negative.   Gastrointestinal: Negative.   Endocrine: Negative.   Genitourinary: Negative.    Musculoskeletal:  Positive for myalgias.  Skin: Negative.   Neurological: Negative.   Hematological: Negative.   Psychiatric/Behavioral: Negative.      Physical Exam:  height is 5' 4 (1.626 m) and weight is 205 lb 12.8 oz (93.4 kg). Her oral temperature is 98.4 F (36.9  C). Her blood pressure is 135/74 and her pulse is 75. Her respiration is 20 and oxygen saturation is 100%.   Wt Readings from Last 3 Encounters:  07/08/24 205 lb 12.8 oz (93.4 kg)  07/08/23 203 lb (92.1 kg)  06/14/23 202 lb 12.8 oz (92 kg)    Physical Exam Vitals reviewed.  Constitutional:      Comments: Chest wall exam shows right breast to be quite large.  There is no masses in the right breast.  There is no right axillary adenopathy.  Left chest wall shows a well-healed mastectomy.  There is no nodules.  There is no erythema.  There is no tenderness.  There is no left axillary adenopathy.  HENT:     Head: Normocephalic and atraumatic.  Eyes:     Pupils: Pupils are equal, round, and reactive to light.  Cardiovascular:     Rate and Rhythm: Normal rate and regular rhythm.     Heart sounds: Normal heart sounds.  Pulmonary:     Effort: Pulmonary effort is normal.     Breath sounds: Normal breath sounds.  Chest:     Chest wall: No mass, deformity, swelling, tenderness or edema.  Breasts:    Right: Normal. No swelling, bleeding, inverted nipple, mass, nipple discharge, skin change or tenderness.     Left: Absent. No swelling or mass.  Abdominal:     General: Bowel sounds are normal.     Palpations: Abdomen is soft.  Musculoskeletal:        General: No tenderness or deformity. Normal range of motion.     Cervical back: Normal range of motion.  Lymphadenopathy:     Cervical: No cervical adenopathy.     Upper Body:     Right upper body: No supraclavicular, axillary or pectoral adenopathy.     Left upper body: No supraclavicular, axillary or pectoral adenopathy.  Skin:    General: Skin is warm and dry.     Findings: No erythema or rash.  Neurological:     Mental Status: She is alert and oriented to person, place, and time.  Psychiatric:        Behavior: Behavior normal.        Thought Content: Thought content normal.        Judgment: Judgment normal.      Lab Results   Component Value Date   WBC 5.1 07/08/2024   HGB 12.9 07/08/2024   HCT 38.3 07/08/2024   MCV 82.5 07/08/2024   PLT 234 07/08/2024     Chemistry      Component Value Date/Time   NA 139 07/08/2024 0957   NA 139 12/15/2014 0834   K 4.5 07/08/2024 0957   K 4.1 12/15/2014 0834   CL  104 07/08/2024 0957   CL 101 10/28/2013 1150   CO2 25 07/08/2024 0957   CO2 22 12/15/2014 0834   BUN 10 07/08/2024 0957   BUN 13.0 12/15/2014 0834   CREATININE 0.76 07/08/2024 0957   CREATININE 0.88 07/08/2023 0939   CREATININE 0.8 12/15/2014 0834      Component Value Date/Time   CALCIUM 9.4 07/08/2024 0957   CALCIUM 9.0 12/15/2014 0834   ALKPHOS 88 07/08/2024 0957   ALKPHOS 81 12/15/2014 0834   AST 21 07/08/2024 0957   AST 15 07/08/2023 0939   AST 16 12/15/2014 0834   ALT 20 07/08/2024 0957   ALT 14 07/08/2023 0939   ALT 13 12/15/2014 0834   BILITOT 0.3 07/08/2024 0957   BILITOT 0.3 07/08/2023 0939   BILITOT 0.31 12/15/2014 0834      Impression and Plan: Ms. Ly is a 64 year old postmenopausal African-American female.  She has had a remote history of recurrent adenocarcinoma of the left breast.  She underwent radiation for this recurrence after surgery.  Again, her main problem right now is the back problems.  She has a large amount of asymmetry with her right breast and left chest wall.  Again I think that she would benefit from breast reduction.  We will see if Dr. Lowery  might be able to help us  out.  Otherwise, we will plan to get her back in another year.  Maude JONELLE Crease, MD 1/28/202610:51 AM "

## 2025-06-14 ENCOUNTER — Ambulatory Visit: Admitting: Internal Medicine

## 2025-07-07 ENCOUNTER — Inpatient Hospital Stay: Admitting: Hematology & Oncology

## 2025-07-07 ENCOUNTER — Inpatient Hospital Stay
